# Patient Record
Sex: Female | Born: 1990 | Race: Black or African American | Hispanic: No | Marital: Married | Smoking: Never smoker
Health system: Southern US, Community
[De-identification: ages and names within clinical notes are randomized; demographics above are authoritative.]

## PROBLEM LIST (undated history)

## (undated) ENCOUNTER — Inpatient Hospital Stay (HOSPITAL_COMMUNITY): Payer: Self-pay

## (undated) DIAGNOSIS — O139 Gestational [pregnancy-induced] hypertension without significant proteinuria, unspecified trimester: Secondary | ICD-10-CM

## (undated) DIAGNOSIS — D649 Anemia, unspecified: Secondary | ICD-10-CM

## (undated) DIAGNOSIS — R112 Nausea with vomiting, unspecified: Secondary | ICD-10-CM

## (undated) DIAGNOSIS — O34219 Maternal care for unspecified type scar from previous cesarean delivery: Secondary | ICD-10-CM

## (undated) DIAGNOSIS — L0292 Furuncle, unspecified: Secondary | ICD-10-CM

## (undated) DIAGNOSIS — Z22322 Carrier or suspected carrier of Methicillin resistant Staphylococcus aureus: Secondary | ICD-10-CM

## (undated) DIAGNOSIS — I10 Essential (primary) hypertension: Secondary | ICD-10-CM

## (undated) DIAGNOSIS — Z9889 Other specified postprocedural states: Secondary | ICD-10-CM

## (undated) DIAGNOSIS — O909 Complication of the puerperium, unspecified: Secondary | ICD-10-CM

## (undated) HISTORY — PX: EYE SURGERY: SHX253

---
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
## 2008-02-08 ENCOUNTER — Emergency Department (HOSPITAL_COMMUNITY): Admission: EM | Admit: 2008-02-08 | Discharge: 2008-02-08 | Payer: Self-pay | Admitting: Emergency Medicine

## 2008-08-20 ENCOUNTER — Emergency Department (HOSPITAL_COMMUNITY): Admission: EM | Admit: 2008-08-20 | Discharge: 2008-08-20 | Payer: Self-pay | Admitting: Emergency Medicine

## 2008-08-22 ENCOUNTER — Emergency Department (HOSPITAL_COMMUNITY): Admission: EM | Admit: 2008-08-22 | Discharge: 2008-08-22 | Payer: Self-pay | Admitting: Emergency Medicine

## 2009-06-24 ENCOUNTER — Encounter: Payer: Self-pay | Admitting: Emergency Medicine

## 2009-06-24 ENCOUNTER — Inpatient Hospital Stay (HOSPITAL_COMMUNITY): Admission: AD | Admit: 2009-06-24 | Discharge: 2009-06-24 | Payer: Self-pay | Admitting: Family Medicine

## 2009-10-25 ENCOUNTER — Ambulatory Visit: Payer: Self-pay | Admitting: Obstetrics and Gynecology

## 2009-10-25 ENCOUNTER — Inpatient Hospital Stay (HOSPITAL_COMMUNITY): Admission: AD | Admit: 2009-10-25 | Discharge: 2009-10-27 | Payer: Self-pay | Admitting: Obstetrics and Gynecology

## 2009-11-08 ENCOUNTER — Inpatient Hospital Stay (HOSPITAL_COMMUNITY): Admission: AD | Admit: 2009-11-08 | Discharge: 2009-11-13 | Payer: Self-pay | Admitting: Family Medicine

## 2009-11-10 ENCOUNTER — Encounter: Payer: Self-pay | Admitting: Family Medicine

## 2010-08-27 ENCOUNTER — Emergency Department (HOSPITAL_COMMUNITY): Payer: Medicaid Other

## 2010-08-27 ENCOUNTER — Inpatient Hospital Stay (HOSPITAL_COMMUNITY)
Admission: AD | Admit: 2010-08-27 | Discharge: 2010-08-27 | Disposition: A | Discharge status: Home / Self Care | Payer: Medicaid Other | Source: Ambulatory Visit | Attending: Obstetrics and Gynecology | Admitting: Obstetrics and Gynecology

## 2010-08-27 ENCOUNTER — Emergency Department (HOSPITAL_COMMUNITY)
Admission: EM | Admit: 2010-08-27 | Discharge: 2010-08-27 | Disposition: A | Discharge status: Home / Self Care | Payer: Medicaid Other | Attending: Emergency Medicine | Admitting: Emergency Medicine

## 2010-08-27 DIAGNOSIS — E669 Obesity, unspecified: Secondary | ICD-10-CM | POA: Insufficient documentation

## 2010-08-27 DIAGNOSIS — I10 Essential (primary) hypertension: Secondary | ICD-10-CM | POA: Insufficient documentation

## 2010-08-27 DIAGNOSIS — R109 Unspecified abdominal pain: Secondary | ICD-10-CM | POA: Insufficient documentation

## 2010-08-27 DIAGNOSIS — O200 Threatened abortion: Secondary | ICD-10-CM | POA: Insufficient documentation

## 2010-08-27 DIAGNOSIS — O208 Other hemorrhage in early pregnancy: Secondary | ICD-10-CM | POA: Insufficient documentation

## 2010-08-27 DIAGNOSIS — A64 Unspecified sexually transmitted disease: Secondary | ICD-10-CM | POA: Insufficient documentation

## 2010-08-27 LAB — CBC
HCT: 31.6 % — ABNORMAL LOW (ref 36.0–46.0)
Hemoglobin: 10.1 g/dL — ABNORMAL LOW (ref 12.0–15.0)
MCH: 24.1 pg — ABNORMAL LOW (ref 26.0–34.0)
MCHC: 32 g/dL (ref 30.0–36.0)
MCV: 75.4 fL — ABNORMAL LOW (ref 78.0–100.0)
Platelets: 338 10*3/uL (ref 150–400)
RBC: 4.19 MIL/uL (ref 3.87–5.11)
RDW: 19 % — ABNORMAL HIGH (ref 11.5–15.5)
WBC: 7.4 10*3/uL (ref 4.0–10.5)

## 2010-08-27 LAB — URINALYSIS, ROUTINE W REFLEX MICROSCOPIC
Bilirubin Urine: NEGATIVE
Ketones, ur: NEGATIVE mg/dL
Nitrite: NEGATIVE
Protein, ur: NEGATIVE mg/dL
Specific Gravity, Urine: 1.007 (ref 1.005–1.030)
Urobilinogen, UA: 0.2 mg/dL (ref 0.0–1.0)
pH: 6.5 (ref 5.0–8.0)

## 2010-08-27 LAB — BASIC METABOLIC PANEL
BUN: 13 mg/dL (ref 6–23)
CO2: 26 mEq/L (ref 19–32)
Calcium: 9 mg/dL (ref 8.4–10.5)
Creatinine, Ser: 0.71 mg/dL (ref 0.4–1.2)
GFR calc Af Amer: >60 mL/min (ref >60)
GFR calc non Af Amer: >60 mL/min (ref >60)
Glucose, Bld: 98 mg/dL (ref 70–99)
Sodium: 136 mEq/L (ref 135–145)

## 2010-08-27 LAB — URINE MICROSCOPIC-ADD ON

## 2010-08-27 LAB — ABO/RH: ABO/RH(D): A POS

## 2010-08-27 LAB — ANTIBODY SCREEN: Antibody Screen: NEGATIVE

## 2010-08-27 LAB — HCG, QUANTITATIVE, PREGNANCY: hCG, Beta Chain, Quant, S: 1808 m[IU]/mL — ABNORMAL HIGH (ref <5)

## 2010-08-27 LAB — WET PREP, GENITAL: Yeast Wet Prep HPF POC: NONE SEEN

## 2010-08-27 LAB — PREGNANCY, URINE: Preg Test, Ur: POSITIVE

## 2010-08-29 LAB — GC/CHLAMYDIA PROBE AMP, GENITAL
Chlamydia, DNA Probe: NEGATIVE
GC Probe Amp, Genital: NEGATIVE

## 2010-09-05 LAB — COMPREHENSIVE METABOLIC PANEL
ALT: 33 U/L (ref 0–35)
ALT: 66 U/L — ABNORMAL HIGH (ref 0–35)
AST: 47 U/L — ABNORMAL HIGH (ref 0–37)
AST: 61 U/L — ABNORMAL HIGH (ref 0–37)
Albumin: 1.9 g/dL — ABNORMAL LOW (ref 3.5–5.2)
Albumin: 2.5 g/dL — ABNORMAL LOW (ref 3.5–5.2)
Alkaline Phosphatase: 87 U/L (ref 39–117)
BUN: 4 mg/dL — ABNORMAL LOW (ref 6–23)
BUN: 7 mg/dL (ref 6–23)
CO2: 21 mEq/L (ref 19–32)
Calcium: 7.6 mg/dL — ABNORMAL LOW (ref 8.4–10.5)
Calcium: 8.7 mg/dL (ref 8.4–10.5)
Chloride: 105 mEq/L (ref 96–112)
Chloride: 108 mEq/L (ref 96–112)
Creatinine, Ser: 0.54 mg/dL (ref 0.4–1.2)
Creatinine, Ser: 0.75 mg/dL (ref 0.4–1.2)
Creatinine, Ser: 0.85 mg/dL (ref 0.4–1.2)
GFR calc Af Amer: >60 mL/min (ref >60)
GFR calc Af Amer: >60 mL/min (ref >60)
GFR calc non Af Amer: >60 mL/min (ref >60)
Glucose, Bld: 103 mg/dL — ABNORMAL HIGH (ref 70–99)
Glucose, Bld: 72 mg/dL (ref 70–99)
Potassium: 4 mEq/L (ref 3.5–5.1)
Potassium: 4.5 mEq/L (ref 3.5–5.1)
Sodium: 136 mEq/L (ref 135–145)
Sodium: 136 mEq/L (ref 135–145)
Total Bilirubin: 0.1 mg/dL — ABNORMAL LOW (ref 0.3–1.2)
Total Bilirubin: 0.3 mg/dL (ref 0.3–1.2)
Total Protein: 6.4 g/dL (ref 6.0–8.3)
Total Protein: 6.5 g/dL (ref 6.0–8.3)

## 2010-09-05 LAB — PROTEIN, URINE, 24 HOUR
Collection Interval-UPROT: 24 hours
Protein, 24H Urine: 192 mg/d — ABNORMAL HIGH (ref 50–100)
Protein, Urine: 8 mg/dL
Urine Total Volume-UPROT: 2400 mL

## 2010-09-05 LAB — CBC
HCT: 29.9 % — ABNORMAL LOW (ref 36.0–46.0)
HCT: 30.7 % — ABNORMAL LOW (ref 36.0–46.0)
Hemoglobin: 10.1 g/dL — ABNORMAL LOW (ref 12.0–15.0)
Hemoglobin: 10.4 g/dL — ABNORMAL LOW (ref 12.0–15.0)
Hemoglobin: 8.7 g/dL — ABNORMAL LOW (ref 12.0–15.0)
MCHC: 33.9 g/dL (ref 30.0–36.0)
MCHC: 33.9 g/dL (ref 30.0–36.0)
MCHC: 34 g/dL (ref 30.0–36.0)
MCV: 82.3 fL (ref 78.0–100.0)
MCV: 82.5 fL (ref 78.0–100.0)
Platelets: 276 10*3/uL (ref 150–400)
Platelets: 306 10*3/uL (ref 150–400)
Platelets: 324 10*3/uL (ref 150–400)
RBC: 3.62 MIL/uL — ABNORMAL LOW (ref 3.87–5.11)
RBC: 3.73 MIL/uL — ABNORMAL LOW (ref 3.87–5.11)
RDW: 19.2 % — ABNORMAL HIGH (ref 11.5–15.5)
RDW: 19.3 % — ABNORMAL HIGH (ref 11.5–15.5)
RDW: 20.1 % — ABNORMAL HIGH (ref 11.5–15.5)
WBC: 10.7 10*3/uL — ABNORMAL HIGH (ref 4.0–10.5)
WBC: 14.9 10*3/uL — ABNORMAL HIGH (ref 4.0–10.5)
WBC: 5.9 10*3/uL (ref 4.0–10.5)

## 2010-09-05 LAB — CREATININE CLEARANCE, URINE, 24 HOUR
Collection Interval-CRCL: 24 hours
Creatinine Clearance: 178 mL/min — ABNORMAL HIGH (ref 75–115)
Creatinine, 24H Ur: 1387 mg/d (ref 700–1800)
Creatinine, Urine: 57.8 mg/dL
Creatinine: 0.54 mg/dL (ref 0.4–1.2)
Urine Total Volume-CRCL: 2400 mL

## 2010-09-05 LAB — LACTATE DEHYDROGENASE: LDH: 123 U/L (ref 94–250)

## 2010-09-05 LAB — RPR: RPR Ser Ql: NONREACTIVE

## 2010-09-05 LAB — MRSA PCR SCREENING: MRSA by PCR: POSITIVE — AB

## 2010-09-06 LAB — COMPREHENSIVE METABOLIC PANEL
ALT: 74 U/L — ABNORMAL HIGH (ref 0–35)
ALT: 75 U/L — ABNORMAL HIGH (ref 0–35)
AST: 73 U/L — ABNORMAL HIGH (ref 0–37)
AST: 82 U/L — ABNORMAL HIGH (ref 0–37)
Albumin: 2.3 g/dL — ABNORMAL LOW (ref 3.5–5.2)
Alkaline Phosphatase: 69 U/L (ref 39–117)
Alkaline Phosphatase: 72 U/L (ref 39–117)
BUN: 7 mg/dL (ref 6–23)
BUN: 7 mg/dL (ref 6–23)
CO2: 21 mEq/L (ref 19–32)
CO2: 23 mEq/L (ref 19–32)
CO2: 25 mEq/L (ref 19–32)
Calcium: 8.9 mg/dL (ref 8.4–10.5)
Chloride: 105 mEq/L (ref 96–112)
Chloride: 108 mEq/L (ref 96–112)
Creatinine, Ser: 0.58 mg/dL (ref 0.4–1.2)
Creatinine, Ser: 0.61 mg/dL (ref 0.4–1.2)
GFR calc Af Amer: >60 mL/min (ref >60)
GFR calc Af Amer: >60 mL/min (ref >60)
GFR calc non Af Amer: >60 mL/min (ref >60)
GFR calc non Af Amer: >60 mL/min (ref >60)
Glucose, Bld: 87 mg/dL (ref 70–99)
Potassium: 3.8 mEq/L (ref 3.5–5.1)
Potassium: 4.1 mEq/L (ref 3.5–5.1)
Sodium: 138 mEq/L (ref 135–145)
Total Bilirubin: 0.1 mg/dL — ABNORMAL LOW (ref 0.3–1.2)
Total Bilirubin: 0.4 mg/dL (ref 0.3–1.2)
Total Bilirubin: 0.6 mg/dL (ref 0.3–1.2)

## 2010-09-06 LAB — CREATININE CLEARANCE, URINE, 24 HOUR
Collection Interval-CRCL: 24 hours
Creatinine, Urine: 83.1 mg/dL
Creatinine: 0.58 mg/dL (ref 0.4–1.2)

## 2010-09-06 LAB — AMYLASE: Amylase: 63 U/L (ref 0–105)

## 2010-09-06 LAB — CBC
HCT: 29.2 % — ABNORMAL LOW (ref 36.0–46.0)
HCT: 29.8 % — ABNORMAL LOW (ref 36.0–46.0)
Hemoglobin: 9.9 g/dL — ABNORMAL LOW (ref 12.0–15.0)
MCHC: 34.1 g/dL (ref 30.0–36.0)
MCV: 81.6 fL (ref 78.0–100.0)
Platelets: 306 10*3/uL (ref 150–400)
RBC: 3.62 MIL/uL — ABNORMAL LOW (ref 3.87–5.11)
WBC: 11.7 10*3/uL — ABNORMAL HIGH (ref 4.0–10.5)

## 2010-09-06 LAB — LIPASE, BLOOD: Lipase: 26 U/L (ref 11–59)

## 2010-09-06 LAB — LACTATE DEHYDROGENASE: LDH: 215 U/L (ref 94–250)

## 2010-09-06 LAB — PROTEIN, URINE, 24 HOUR
Protein, 24H Urine: 171 mg/d — ABNORMAL HIGH (ref 50–100)
Urine Total Volume-UPROT: 1900 mL

## 2010-09-29 LAB — BASIC METABOLIC PANEL
BUN: 7 mg/dL (ref 6–23)
GFR calc non Af Amer: >60 mL/min (ref >60)
Potassium: 3.4 mEq/L — ABNORMAL LOW (ref 3.5–5.1)
Sodium: 134 mEq/L — ABNORMAL LOW (ref 135–145)

## 2010-09-29 LAB — URINALYSIS, ROUTINE W REFLEX MICROSCOPIC
Nitrite: NEGATIVE
Urobilinogen, UA: 1 mg/dL (ref 0.0–1.0)
pH: 6 (ref 5.0–8.0)

## 2010-09-29 LAB — URINE MICROSCOPIC-ADD ON

## 2010-09-29 LAB — DIFFERENTIAL
Eosinophils Relative: 3 % (ref 0–5)
Lymphocytes Relative: 5 % — ABNORMAL LOW (ref 12–46)
Lymphs Abs: 0.6 10*3/uL — ABNORMAL LOW (ref 0.7–4.0)
Neutro Abs: 11.1 10*3/uL — ABNORMAL HIGH (ref 1.7–7.7)

## 2010-09-29 LAB — URINE CULTURE

## 2010-09-29 LAB — CBC
HCT: 33.2 % — ABNORMAL LOW (ref 36.0–46.0)
Platelets: 353 10*3/uL (ref 150–400)
WBC: 12.8 10*3/uL — ABNORMAL HIGH (ref 4.0–10.5)

## 2010-09-29 LAB — RAPID STREP SCREEN (MED CTR MEBANE ONLY): Streptococcus, Group A Screen (Direct): POSITIVE — AB

## 2010-11-04 NOTE — Op Note (Signed)
NAME:  Krystal Hartman, Krystal Hartman                      ACCOUNT NO.:  [id number]   MEDICAL RECORD NO.:  [id number]                   PATIENT TYPE:  AMB   LOCATION:  DSC                                  FACILITY:  [facility]   PHYSICIAN:  Pasty Spillers. Maple Hudson, M.D.              DATE OF BIRTH:  02-Jun-1991   DATE OF PROCEDURE:  DATE OF DISCHARGE:                                 OPERATIVE REPORT   DATE OF SURGERY:  January 15, 2004.   PREOPERATIVE DIAGNOSIS:  Exotropia.   POSTOPERATIVE DIAGNOSIS:  Exotropia.   PROCEDURE:  Lateral rectus muscle recession, 10.0 mm OU.   SURGEON:  Pasty Spillers. Young, MD.   ANESTHESIA:  General (laryngeal mask).   COMPLICATIONS:  None.   DESCRIPTION OF PROCEDURE:  After preoperative evaluation including informed  consent from the parents, the patient was taken to the operating room and  identified by me.  General anesthesia was induced without difficulty after  placement of appropriate monitors.  The patient was prepped and draped in a  standard sterile fashion.  A lid speculum was placed in the right eye.   With the inferotemporal fornix incision through conjunctiva and tenon's  fascia, the right lateral rectus muscle was engaged on a series of muscle  hooks and carefully cleared of its fascial attachments.  The tendon was  secured with a double arm 6-0 Vicryl suture, the double locking bite at each  border of the muscle, 1 mm from the insertion.  The muscle was disinserted  from the globe using Westcott scissors and was reattached to sclera at a  measured distance of 10.0 mm posterior to the original insertion, using  direct scleral passes in a crossed-swords fashion.  The suture ends were  tied securely after the position in the muscle had been checked and found to  be accurate.  The conjunctiva was closed with two interrupted 6-0 Vicryl  sutures.  The lid speculum was transferred to the left eye, where an  identical procedure was performed, again effecting a 10.0 mm  recession of  the lateral rectus muscle.  TobraDex ointment was placed in each eye.  The  patient was awakened without difficulty and taken to the recovery room in  stable condition, having suffered no intraoperative or immediate  postoperative complications.                                               Pasty Spillers. Maple Hudson, M.D.    Cheron Schaumann  D:  01/15/2004  T:  01/15/2004  Job:  [id number]

## 2011-01-13 LAB — HIV ANTIBODY (ROUTINE TESTING W REFLEX): HIV: NONREACTIVE

## 2011-01-13 LAB — HEPATITIS B SURFACE ANTIGEN: Hepatitis B Surface Ag: NEGATIVE

## 2011-01-13 LAB — RPR: RPR: NONREACTIVE

## 2011-03-18 ENCOUNTER — Other Ambulatory Visit: Payer: Self-pay | Admitting: Obstetrics and Gynecology

## 2011-03-18 ENCOUNTER — Encounter (HOSPITAL_COMMUNITY): Payer: Self-pay

## 2011-03-18 ENCOUNTER — Inpatient Hospital Stay (HOSPITAL_COMMUNITY): Payer: Medicaid Other

## 2011-03-18 ENCOUNTER — Inpatient Hospital Stay (HOSPITAL_COMMUNITY)
Admission: AD | Admit: 2011-03-18 | Discharge: 2011-03-18 | Disposition: A | Discharge status: Home / Self Care | Payer: Medicaid Other | Source: Ambulatory Visit | Attending: Obstetrics and Gynecology | Admitting: Obstetrics and Gynecology

## 2011-03-18 DIAGNOSIS — O368190 Decreased fetal movements, unspecified trimester, not applicable or unspecified: Secondary | ICD-10-CM | POA: Insufficient documentation

## 2011-03-18 HISTORY — DX: Carrier or suspected carrier of methicillin resistant Staphylococcus aureus: Z22.322

## 2011-03-18 HISTORY — DX: Essential (primary) hypertension: I10

## 2011-03-18 NOTE — Progress Notes (Signed)
No fetal movement since this afternoon felts some earlier today

## 2011-03-18 NOTE — ED Provider Notes (Signed)
History   20 yo G3P1011 at 82 3/7 weeks presented c/o decreased FM today.  Has had perception of FM daily, until today.  Reports some upper abdominal tightness, "like constipation".  Denies leaking, bleeding, or dysuria.  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 Decreased Fetal Movement     OB History    Grav Para Term Preterm Abortions TAB SAB Ect Mult Living   3 1 1  1 1    1        History  Substance Use Topics  . Smoking status: Not on file  . Smokeless tobacco: Not on file  . Alcohol Use: Not on file    Allergies:None    Physical Exam   Blood pressure 138/82, pulse 91, temperature 98.2 F (36.8 C), temperature source Oral, resp. rate 16, height 5\' 4"  (1.626 m), weight 170.552 kg (376 lb).  Chest clear Heart RRR Abd--Gravid, NT FHR 140s per cardio, no decels. No UCs. Audible FM noted. Cervix long and closed, firm  ED Course  IUP at 23 3/7 weeks Perception of decreased FM  Will check limited US due to significant limitations from body habitus on fetal assessment. If findings WNL, will d/c home. Has scheduled appointment on Tuesday at [facility] for follow-up anatomy ultrasound.  Nigel Bridgeman, CNM 03/18/11 1830

## 2011-05-25 ENCOUNTER — Inpatient Hospital Stay (HOSPITAL_COMMUNITY)
Admission: AD | Admit: 2011-05-25 | Discharge: 2011-06-01 | DRG: 781 | Disposition: A | Discharge status: Home / Self Care | Payer: Medicaid Other | Source: Ambulatory Visit | Attending: Obstetrics and Gynecology | Admitting: Obstetrics and Gynecology

## 2011-05-25 ENCOUNTER — Encounter (HOSPITAL_COMMUNITY): Payer: Self-pay | Admitting: *Deleted

## 2011-05-25 ENCOUNTER — Other Ambulatory Visit: Payer: Self-pay | Admitting: Obstetrics and Gynecology

## 2011-05-25 DIAGNOSIS — L02229 Furuncle of trunk, unspecified: Secondary | ICD-10-CM | POA: Diagnosis present

## 2011-05-25 DIAGNOSIS — Z8619 Personal history of other infectious and parasitic diseases: Secondary | ICD-10-CM | POA: Insufficient documentation

## 2011-05-25 DIAGNOSIS — A4902 Methicillin resistant Staphylococcus aureus infection, unspecified site: Secondary | ICD-10-CM | POA: Diagnosis present

## 2011-05-25 DIAGNOSIS — O99210 Obesity complicating pregnancy, unspecified trimester: Secondary | ICD-10-CM | POA: Diagnosis present

## 2011-05-25 DIAGNOSIS — B009 Herpesviral infection, unspecified: Secondary | ICD-10-CM | POA: Diagnosis not present

## 2011-05-25 DIAGNOSIS — O99019 Anemia complicating pregnancy, unspecified trimester: Secondary | ICD-10-CM | POA: Diagnosis present

## 2011-05-25 DIAGNOSIS — O99891 Other specified diseases and conditions complicating pregnancy: Secondary | ICD-10-CM | POA: Diagnosis present

## 2011-05-25 DIAGNOSIS — D649 Anemia, unspecified: Secondary | ICD-10-CM

## 2011-05-25 DIAGNOSIS — E669 Obesity, unspecified: Secondary | ICD-10-CM | POA: Diagnosis present

## 2011-05-25 DIAGNOSIS — L0293 Carbuncle, unspecified: Secondary | ICD-10-CM | POA: Diagnosis present

## 2011-05-25 DIAGNOSIS — O403XX0 Polyhydramnios, third trimester, not applicable or unspecified: Secondary | ICD-10-CM | POA: Diagnosis present

## 2011-05-25 DIAGNOSIS — L732 Hidradenitis suppurativa: Secondary | ICD-10-CM | POA: Diagnosis not present

## 2011-05-25 DIAGNOSIS — L02239 Carbuncle of trunk, unspecified: Secondary | ICD-10-CM | POA: Diagnosis present

## 2011-05-25 DIAGNOSIS — O34219 Maternal care for unspecified type scar from previous cesarean delivery: Secondary | ICD-10-CM | POA: Diagnosis present

## 2011-05-25 DIAGNOSIS — O283 Abnormal ultrasonic finding on antenatal screening of mother: Secondary | ICD-10-CM | POA: Diagnosis present

## 2011-05-25 DIAGNOSIS — O10019 Pre-existing essential hypertension complicating pregnancy, unspecified trimester: Principal | ICD-10-CM | POA: Diagnosis present

## 2011-05-25 DIAGNOSIS — O409XX0 Polyhydramnios, unspecified trimester, not applicable or unspecified: Secondary | ICD-10-CM | POA: Diagnosis present

## 2011-05-25 LAB — CBC
HCT: 28.8 % — ABNORMAL LOW (ref 36.0–46.0)
Hemoglobin: 9.1 g/dL — ABNORMAL LOW (ref 12.0–15.0)
MCV: 76.8 fL — ABNORMAL LOW (ref 78.0–100.0)
Platelets: 263 10*3/uL (ref 150–400)
RBC: 3.75 MIL/uL — ABNORMAL LOW (ref 3.87–5.11)
WBC: 8.5 10*3/uL (ref 4.0–10.5)

## 2011-05-25 LAB — URINALYSIS, ROUTINE W REFLEX MICROSCOPIC
Bilirubin Urine: NEGATIVE
Hgb urine dipstick: NEGATIVE
Leukocytes, UA: NEGATIVE
Specific Gravity, Urine: 1.025 (ref 1.005–1.030)
Urobilinogen, UA: 0.2 mg/dL (ref 0.0–1.0)
pH: 6 (ref 5.0–8.0)

## 2011-05-25 LAB — GLUCOSE, CAPILLARY: Glucose-Capillary: 172 mg/dL — ABNORMAL HIGH (ref 70–99)

## 2011-05-25 MED ORDER — VALACYCLOVIR HCL 500 MG PO TABS
500.0000 mg | ORAL_TABLET | Freq: Two times a day (BID) | ORAL | Status: AC
  Administered 2011-05-25 – 2011-05-30 (×10): 500 mg via ORAL
  Filled 2011-05-25 (×10): qty 1

## 2011-05-25 MED ORDER — CALCIUM CARBONATE ANTACID 500 MG PO CHEW: 2.0000 | CHEWABLE_TABLET | ORAL | Status: DC | PRN

## 2011-05-25 MED ORDER — LACTATED RINGERS IV SOLN
INTRAVENOUS | Status: DC
  Administered 2011-05-27 – 2011-05-29 (×6): via INTRAVENOUS

## 2011-05-25 MED ORDER — ACETAMINOPHEN 325 MG PO TABS: 650.0000 mg | ORAL_TABLET | ORAL | Status: DC | PRN

## 2011-05-25 MED ORDER — ZOLPIDEM TARTRATE 10 MG PO TABS: 10.0000 mg | ORAL_TABLET | Freq: Every evening | ORAL | Status: DC | PRN

## 2011-05-25 MED ORDER — PRENATAL PLUS 27-1 MG PO TABS
1.0000 | ORAL_TABLET | Freq: Every day | ORAL | Status: DC
  Administered 2011-05-25 – 2011-06-01 (×8): 1 via ORAL
  Filled 2011-05-25 (×8): qty 1

## 2011-05-25 MED ORDER — CLOTRIMAZOLE 2 % VA CREA
1.0000 | TOPICAL_CREAM | Freq: Every day | VAGINAL | Status: AC
  Administered 2011-05-25 – 2011-05-26 (×2): 1 via VAGINAL
  Filled 2011-05-25: qty 22.2

## 2011-05-25 MED ORDER — DOCUSATE SODIUM 100 MG PO CAPS
100.0000 mg | ORAL_CAPSULE | Freq: Every day | ORAL | Status: DC
  Administered 2011-05-25 – 2011-06-01 (×7): 100 mg via ORAL
  Filled 2011-05-25 (×8): qty 1

## 2011-05-25 MED ORDER — CLINDAMYCIN HCL 300 MG PO CAPS
450.0000 mg | ORAL_CAPSULE | Freq: Four times a day (QID) | ORAL | Status: DC
  Administered 2011-05-25 – 2011-05-26 (×3): 450 mg via ORAL
  Filled 2011-05-25 (×7): qty 1

## 2011-05-25 NOTE — H&P (Deleted)
Krystal Hartman is a 20 y.o. female presenting for fetal monitoring, secondary to BPP in office today 2/8, showing polydramnios. See note per V.Emilee Hero CNM for complete H&P History OB History    Grav Para Term Preterm Abortions TAB SAB Ect Mult Living   3 1 1  0 1 0 1 0 0 1     Past Medical History  Diagnosis Date  . Hypertension   . MRSA (methicillin resistant Staphylococcus aureus) colonization    Past Surgical History  Procedure Date  . Eye surgery   . Cesarean section    Family History: family history includes Diabetes in her maternal aunt and mother. Social History:  reports that she has never smoked. She has never used smokeless tobacco. She reports that she does not drink alcohol or use illicit drugs.  ROS    Blood pressure 144/82, pulse 102, temperature 98.8 F (37.1 C), temperature source Oral, resp. rate 20, height 5\' 4"  (1.626 m), weight 175.542 kg (387 lb). Maternal Exam:  Introitus: Vagina is negative for discharge.    Physical Exam  Constitutional: She is oriented to person, place, and time. She appears well-developed and well-nourished.       She denies any HA/N/V or RUQ pain  Neck: Normal range of motion.  Cardiovascular: Normal rate, regular rhythm and normal heart sounds.   Respiratory: Effort normal and breath sounds normal.  GI: Soft. Bowel sounds are normal. She exhibits no distension. There is no tenderness.       Pt denies any ctx, has occ cramping that she has been having,   Genitourinary: Vagina normal. No vaginal discharge found.       Pt currently being treated for yeast infection, has completed 5 doses of terazol  Musculoskeletal: Normal range of motion. She exhibits no edema.  Neurological: She is alert and oriented to person, place, and time. She has normal reflexes.  Skin: Skin is warm and dry.       L upper side/back lesion about 3cm, has some clear drainage 2nd lesion in L groin area about .5cm, also some clear drainage    Pelvic: cervix  =firm/long/closed - will FFN for now   Prenatal labs: ABO, Rh: --/--/A POS (03/10 0805) Antibody:   Rubella:   RPR:    HBsAg:    HIV:    GBS:     Assessment/Plan: Will do FFN and VE Send MRSA cx for skin lesions  D/W  Dr Pennie Rushing, will send FFN   Kyshon Tolliver M 05/25/2011, 8:45 PM

## 2011-05-25 NOTE — Progress Notes (Signed)
Krystal Hartman is a 20 y.o. G3P1011 at [redacted]w[redacted]d by LMP admitted for BPP of 2/8 in office today.  Subjective: GI: negative GU: Denies: dysuria, frequency/urgency, hematuria OB: good movement        Objective: BP 144/82  Pulse 102  Temp(Src) 98.8 F (37.1 C) (Oral)  Resp 22  Ht 5\' 4"  (1.626 m)  Wt 387 lb (175.542 kg)  BMI 66.43 kg/m2      FHT:  FHR: 130s bpm, variability: minimal ,  accelerations:  Abscent,  decelerations:  Absent UC:   irregular, every 6-8 minutes SVE:   Dilation: Closed Exam by:: Sanda Klein, CNM  Labs: Lab Results  Component Value Date   WBC 7.4 08/27/2010   HGB 10.1* 08/27/2010   HCT 31.6* 08/27/2010   MCV 75.4* 08/27/2010   PLT 338 08/27/2010   FFN NEG Assessment / Plan: NON reassuring FHRT  Fetal Wellbeing:  Category II  Observe FHR overnight.  Repeat BPP in am.  MFM consult and ID consult in am. [place] P 05/25/2011, 10:03 PM

## 2011-05-25 NOTE — H&P (Signed)
Krystal Hartman is a 20 y.o. female, 54 1/7 weeks, G3P1011,presenting for admission due to [facility] 2/8 today in office.  Credit given for fluid volume only.  Patient also has a possible MRSA lesion on her back--swollen and red, with hx MRSA in past.  Fluid volume was 35 cm today, previously 28 on last [place].  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  Had been on HCTZ prior to pregnancy, stopped before starting care.  PIH labs, 24 hour urine, and 1 h GTT were done at 17 weeks, with elevated glucola only abnormal finding.  Had normal 3 hr GTT following.  Had boil under right breast at 17 weeks, and another on abdomen at 20 weeks, cultured as staph, negative MRSA (hx past MRSA).  Had [place] at 19 weeks, with limited anatomy, normal follow-up for anatomy completion.  Desires VBAC--previous C/S was 2 layer closure per op note.  Had follow-up US at 27 weeks, with normal growth and fluid.  Had repeat 3 hr GTT 05/04/11--normal.  Had [place] at 32 weeks with polyhydramnios noted--AFI 28.  [place] in office today had AFI of 35, BPP 2/8, only credit for fluid.     OB History    Grav Para Term Preterm Abortions TAB SAB Ect Mult Living   3 1 1  1  0 1   1    #1--5/11. Primary LTCS of female infant, 7+3 at 39 weeks after 48 hours labor.  HTN in that pregnancy, treated short term with medications.   #2--3/12. SAB at 6 weeks.  Past Medical History  Diagnosis Date  . Hypertension   . MRSA (methicillin resistant Staphylococcus aureus) colonization   Hx ovarian cysts.  Hx trich 3/12.  HTN diagnosed 2010, on HCTZ.  Hx cystitis.    Past Surgical History  Procedure Date  . Eye surgery   . Cesarean section   I&D of folliculitis lesions  Family History: family history is not on file. Sister, mother, 1st cousin HTN.  Mother,  sister  trait.  Mother, sister anemia.  Mother, MA, PAs x2  insulin dependent diabetics.  MA hypothyroid.  MGM stomach ca.  Sister paranoid schizophrenic, bipolar.  Mother, sister smokers.  Social History:  reports that she has never smoked. She has never used smokeless tobacco. She reports that she does not drink alcohol or use illicit drugs.Married to FOB Evelena Leyden).  Patient has 10th grade education, unemployed.  FOB with HS education, employed in warehouse.  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 rate 18, height 5\' 4"  (1.626 m), weight 175.542 kg (387 lb). See addendum to H&P for PE by Gevena Barre, CNM.  Prenatal labs: ABO, Rh: --/--/A POS (03/10 0805) Antibody:  Neg Rubella:  Immune RPR:   NR HBsAg:   Neg HIV:   Neg GBS:   Not yet done Hgb 9.5 at NOB 1 hour glucola high at 19 weeks, 3 hour GTT WNL. Repeat 3 h GTT 05/04/11 WNL.  Initial FHR tracing reassuring, with mild irritability.  Assessment/Plan: IUP at 33 1/7 weeks BPP 2/8 Polyhydramnios Morbid obesity Hx MRSA, with current possible MRSA lesion on back Previous C/S 5/11 Hx HSV 2 Chronic HTN  Plan: Admit to Antenatal Unit per  consult with Dr. Pennie Rushing. Continuous EFM Repeat BPP in am FBS and 2 h pp tomorrow   Nigel Bridgeman 05/25/2011, 6:55 PM

## 2011-05-26 ENCOUNTER — Inpatient Hospital Stay (HOSPITAL_COMMUNITY): Payer: Medicaid Other

## 2011-05-26 DIAGNOSIS — L0293 Carbuncle, unspecified: Secondary | ICD-10-CM

## 2011-05-26 DIAGNOSIS — L0292 Furuncle, unspecified: Secondary | ICD-10-CM

## 2011-05-26 DIAGNOSIS — D649 Anemia, unspecified: Secondary | ICD-10-CM

## 2011-05-26 LAB — GC/CHLAMYDIA PROBE AMP, GENITAL: Chlamydia, DNA Probe: NEGATIVE

## 2011-05-26 LAB — MRSA PCR SCREENING: MRSA by PCR: POSITIVE — AB

## 2011-05-26 MED ORDER — VANCOMYCIN HCL 1000 MG IV SOLR
2000.0000 mg | Freq: Once | INTRAVENOUS | Status: AC
  Administered 2011-05-26: 2000 mg via INTRAVENOUS
  Filled 2011-05-26: qty 2000

## 2011-05-26 MED ORDER — VANCOMYCIN HCL 1000 MG IV SOLR
1500.0000 mg | Freq: Two times a day (BID) | INTRAVENOUS | Status: DC
  Administered 2011-05-27 – 2011-05-28 (×4): 1500 mg via INTRAVENOUS
  Filled 2011-05-26 (×5): qty 1500

## 2011-05-26 NOTE — Progress Notes (Signed)
UR Chart review completed.  

## 2011-05-26 NOTE — Consult Note (Signed)
MATERNAL FETAL MEDICINE CONSULT  Patient Name: Krystal Hartman Medical Record Number:  [id number] Date of Birth: [birth year] Requesting Physician Name:  Hal Morales, MD Date of Service: 05/26/2011  Chief Complaint Non-reassuring BPP in clinic  History of Present Illness Krystal Hartman was seen today for prenatal diagnosis secondary to non-reassuring BPP of 2/8 in clinic yesterday at the request of Hal Morales, MD.  The patient is a 20 y.o. G3P1011, at [redacted]w[redacted]d with an EDD of 07/12/2011 by Alternate EDD Entry dating method.  She was seen for a follow up BPP yesterday in clinic due to a history of polyhydramnios discovered on a prior ultrasound.  Polyhydramnios was again noted and the patient was found to have a BPP of 2/8.  After transfer to [facility] she was found to have a reassuring NST and was observed overnight.  She reports normal fetal movement this morning.  BPP today in the [facility] was 8/8, with mild polyhydramnios.  She reports no vaignal bleeding, loss of fluid, contractions, headache, visual changes, increased swelling, or RUQ pain.  She reports being tested twice for gestational diabetes this pregnancy and both tests were normal.  Review of Systems Pertinent items are noted in HPI.  Patient History OB History    Grav Para Term Preterm Abortions TAB SAB Ect Mult Living   3 1 1  0 1 0 1 0 0 1     # Outc Date GA Lbr Len/2nd Wgt Sex Del Anes PTL Lv   1 TRM            2 SAB            3 CUR               Past Medical History  Diagnosis Date  . Hypertension   . MRSA (methicillin resistant Staphylococcus aureus) colonization     Past Surgical History  Procedure Date  . Eye surgery   . Cesarean section     History   Social History  . Marital Status: Married    Spouse Name: N/A    Number of Children: N/A  . Years of Education: N/A   Social History Main Topics  . Smoking status: Never Smoker   . Smokeless tobacco: Never Used  . Alcohol Use: No  .  Drug Use: No  . Sexually Active: Yes   Other Topics Concern  . None   Social History Narrative  . None    Family History  Problem Relation Age of Onset  . Diabetes Mother   . Diabetes Maternal Aunt    In addition, the patient has no family history of mental retardation, birth defects, or genetic diseases.  Physical Examination Patient Vitals for the past 24 hrs:  BP Temp Temp src Pulse Resp Height Weight  05/26/11 0743 131/84 mmHg 98.2 F (36.8 C) Oral 91  20  - -  05/26/11 0637 130/70 mmHg 98.7 F (37.1 C) Oral 90  22  - -  05/26/11 0000 - - - - 20  - -  05/25/11 2300 - - - - 20  - -  05/25/11 2200 - - - - 20  - -  05/25/11 2100 - - - - 22  - -  05/25/11 2003 144/82 mmHg 98.8 F (37.1 C) Oral 102  20  - -  05/25/11 1908 146/77 mmHg - - 110  - - -  05/25/11 1906 146/77 mmHg 98.9 F (37.2 C) Oral 110  18  5\' 4"  (  1.626 m) 387 lb (175.542 kg)   General appearance - alert, well appearing, and in no distress Chest - clear to auscultation, no wheezes, rales or rhonchi, symmetric air entry Heart - normal rate, regular rhythm, normal S1, S2, no murmurs, rubs, clicks or gallops Abdomen - soft, nontender, nondistended, no masses or organomegaly   Labs Results for orders placed during the hospital encounter of 05/25/11 (from the past 24 hour(s))  FETAL FIBRONECTIN     Status: Normal   Collection Time   05/25/11  9:05 PM      Component Value Range   Fetal Fibronectin NEGATIVE  NEGATIVE   MRSA PCR SCREENING     Status: Normal   Collection Time   05/25/11  9:05 PM      Component Value Range   MRSA by PCR NEGATIVE  NEGATIVE   URINALYSIS, ROUTINE W REFLEX MICROSCOPIC     Status: Abnormal   Collection Time   05/25/11 10:00 PM      Component Value Range   Color, Urine ORANGE (*) YELLOW    APPearance CLEAR  CLEAR    Specific Gravity, Urine 1.025  1.005 - 1.030    pH 6.0  5.0 - 8.0    Glucose, UA NEGATIVE  NEGATIVE (mg/dL)   Hgb urine dipstick NEGATIVE  NEGATIVE    Bilirubin  Urine NEGATIVE  NEGATIVE    Ketones, ur 15 (*) NEGATIVE (mg/dL)   Protein, ur NEGATIVE  NEGATIVE (mg/dL)   Urobilinogen, UA 0.2  0.0 - 1.0 (mg/dL)   Nitrite NEGATIVE  NEGATIVE    Leukocytes, UA NEGATIVE  NEGATIVE   CBC     Status: Abnormal   Collection Time   05/25/11 10:35 PM      Component Value Range   WBC 8.5  4.0 - 10.5 (K/uL)   RBC 3.75 (*) 3.87 - 5.11 (MIL/uL)   Hemoglobin 9.1 (*) 12.0 - 15.0 (g/dL)   HCT 81.1 (*) 91.4 - 46.0 (%)   MCV 76.8 (*) 78.0 - 100.0 (fL)   MCH 24.3 (*) 26.0 - 34.0 (pg)   MCHC 31.6  30.0 - 36.0 (g/dL)   RDW 78.2 (*) 95.6 - 15.5 (%)   Platelets 263  150 - 400 (K/uL)  RPR     Status: Normal   Collection Time   05/25/11 10:35 PM      Component Value Range   RPR NON REACTIVE  NON REACTIVE   GLUCOSE, CAPILLARY     Status: Abnormal   Collection Time   05/25/11 11:05 PM      Component Value Range   Glucose-Capillary 172 (*) 70 - 99 (mg/dL)  GLUCOSE, CAPILLARY     Status: Normal   Collection Time   05/26/11  6:32 AM      Component Value Range   Glucose-Capillary 79  70 - 99 (mg/dL)    Assessment and Recommendations 1.  Non-reassuring BPP.  The reason for the non-reassuring BPP yesterday in clinic is unclear.  Fortunately, the fetal status is reassuring this morning as the patient reports normal fetal movement, has a reactive NST, and normal BPP of 8/8.  Given the severely abnormal BPP yesterday I would continue in hospital observation for one or two more days, repeating a BPP each day keeping the patient on continuous monitoring while in house.  If fetal testing remains reassuring during this time dissmissal would be appropriate.  As the patient has chronic hypertension I recommend proceeding with twice weekly fetal testing after dismissal.   2.  Elevated BP.  It is incidentally noted that the patient's BP has been elevated above 140/90 on several occasions since admission.  I recommend evaluating the patient for superimposed preeclampsia by obtaining a 24  hour urine collection and drawing LFT'S and a creatinine.  If the patient meets criteria for superimposed preeclampsia I would recommend delivery as the patient is nearly [redacted] weeks gestation.   Rema Fendt

## 2011-05-26 NOTE — Progress Notes (Signed)
Pt states she still cannot feel ctx's at this time.

## 2011-05-26 NOTE — Progress Notes (Signed)
ANTIBIOTIC CONSULT NOTE - INITIAL  Pharmacy Consult for Vancomycin Indication:Recurrent Boils  Allergies  Allergen Reactions  . Latex Hives and Itching  . Pork-Derived Products Other (See Comments)    Religous reasons does not eat    Patient Measurements: Height: 5\' 4"  (162.6 cm) Weight: 387 lb (175.542 kg) IBW/kg (Calculated) : 54.7    Vital Signs: Temp: 98.7 F (37.1 C) (12/07 1233) Temp src: Oral (12/07 1233) BP: 148/73 mmHg (12/07 1233) Pulse Rate: 94  (12/07 1233)   Labs:  [facility] 05/25/11 2235  WBC 8.5  HGB 9.1*  PLT 263  LABCREA --  CREATININE --   Last SCreatinine from 3/12= 0.71 with estimated CrCl > 133ml/min. Will draw SCr in am to assess current renal function.   Microbiology: Recent Results (from the past 720 hour(s))  MRSA PCR SCREENING     Status: Normal   Collection Time   05/25/11  9:05 PM      Component Value Range Status Comment   MRSA by PCR NEGATIVE  NEGATIVE  Final   MRSA PCR SCREENING     Status: Abnormal   Collection Time   05/26/11  9:50 AM      Component Value Range Status Comment   MRSA by PCR POSITIVE (*) NEGATIVE  Final     Medical History: Past Medical History  Diagnosis Date  . Hypertension   . MRSA (methicillin resistant Staphylococcus aureus) colonization     Medications:  Clindamycin 450mg  po q6h  12/6-12/7 Assessment: 20yo [redacted] week GA patient admitted with recurrent boils. Pt has h/o mixed bacteria isolated from boils in past.  Goal of Therapy:  Vancomycin trough 15-20 mcg/ml due to h/o MRSA colonization in past.  Plan:  1. Vancomycin 2000mg  IV x 1. Then, Vancomycin 1500mg  IV q12h. 2. Draw SCreatinine in am to assess current renal function. 3. Will draw Vanc trough at steady state and adjust dose accordingly. Will continue to follow. Thanks!  Claybon Jabs 05/26/2011,4:29 PM

## 2011-05-26 NOTE — Progress Notes (Signed)
Subjective: No complaints this AM.  Just returned from u/s and 8/8 BPP.  MRSA screen entered incorrectly, so RN to do nasal swab this AM.  Fasting CBG=79 per RN.  Good FM.  No ctxs, LOF, or VB to report.  Objective: BP 131/84  Pulse 91  Temp(Src) 98.2 F (36.8 C) (Oral)  Resp 20  Ht 5\' 4"  (1.626 m)  Wt 175.542 kg (387 lb)  BMI 66.43 kg/m2      FHT:  FHR: 130 bpm, variability: moderate,  accelerations:  Present,  decelerations:  Absent UC:   occ'l ctxs; intermittent UI SVE:   Dilation: Closed Exam by:: Sanda Klein, CNM  Labs: Lab Results  Component Value Date   WBC 8.5 05/25/2011   HGB 9.1* 05/25/2011   HCT 28.8* 05/25/2011   MCV 76.8* 05/25/2011   PLT 263 05/25/2011    Assessment / Plan: 1.  IUP at 33.2 2.  8/8 BPP this AM w/ overall 10/10 total 3.  Morbidly obese 4.  Polyhydramnios 5.  H/o MRSA 6.  HSV hx  Fetal Wellbeing:  Category I  1.  Continue current POC 2.  MD to follow  Jamal Haskin H 05/26/2011, 9:59 AM

## 2011-05-26 NOTE — Progress Notes (Signed)
Pt states she does not feel ctx's.

## 2011-05-26 NOTE — Consult Note (Signed)
Infectious Diseases Initial Consultation        Day  2 clindamycin Her Date of Admission:  05/25/2011  Date of Consult:  05/26/2011  Reason for Consult: Evaluation of recurrent boils Referring Physician: Dr. Dierdre Forth   Problem List:  Active Problems:  Recurrent boils  Morbid obesity  Abnormal antenatal ultrasound  History of MRSA infection  HSV-2 (herpes simplex virus 2) infection  Polyhydramnios in third trimester  Anemia   Recommendations: 1. Change clindamycin to IV vancomycin pending culture results.   Assessment: She has had recurrent boils with mixed bacteria over the last 5 years. I favor switching to IV vancomycin which has more reliable activity against MRSA and will also cover other strains of staph and streptococci. Now that the largest one appears to be draining spontaneously she is likely to improve more rapidly. I will follow with you and monitor her culture results.    HPI: Krystal Hartman is a 20 y.o. female who is [redacted] weeks pregnant and was admitted yesterday after a low biophysical profile of 2/8 and was noted in the office. After admission she was noted have 2 boils on her left mid back. She states that she has a history of recurrent boils over the last 5 years. Frequently they drained spontaneously but she's had to have several lanced. Her last boil in September grew viridans strep. A culture in July grew MRSA. She denies having any fever, chills or sweats recently. Recurrent boils began about 3 days ago. After admission last night one area began to drain spontaneously.   Review of Systems: She denies anorexia, fever, chills, sweats or any history of dermatologic problems other than her boils.     . clindamycin  450 mg Oral Q6H  . clotrimazole  1 Applicatorful Vaginal QHS  . docusate sodium  100 mg Oral Daily  . prenatal vitamin w/FE, FA  1 tablet Oral Daily  . valACYclovir  500 mg Oral BID    Past Medical History  Diagnosis Date  . Hypertension    . MRSA (methicillin resistant Staphylococcus aureus) colonization     History  Substance Use Topics  . Smoking status: Never Smoker   . Smokeless tobacco: Never Used  . Alcohol Use: No    Family History  Problem Relation Age of Onset  . Diabetes Mother   . Diabetes Maternal Aunt    Allergies  Allergen Reactions  . Latex Hives and Itching  . Pork-Derived Products Other (See Comments)    Religous reasons does not eat    OBJECTIVE: Blood pressure 148/73, pulse 94, temperature 98.7 F (37.1 C), temperature source Oral, resp. rate 24, height 5\' 4"  (1.626 m), weight 175.542 kg (387 lb). General: She is comfortable and in no distress sitting up in bed. She is obese. Skin: There are 2 adjacent boils on her left mid back under a large skin pannus. One has some moderate yellow drainage. Both are fairly firm and about the diameter of the nickle. There is minimal surrounding cellulitis. They're not particularly tender to palpation.  Lungs: Clear Cor: Distant heart sounds with a regular S1 and S2 no murmurs. Abdomen: Obese with a gravid uterus    Results for orders placed during the hospital encounter of 05/25/11 (from the past 48 hour(s))  FETAL FIBRONECTIN     Status: Normal   Collection Time   05/25/11  9:05 PM      Component Value Range Comment   Fetal Fibronectin NEGATIVE  NEGATIVE    MRSA  PCR SCREENING     Status: Normal   Collection Time   05/25/11  9:05 PM      Component Value Range Comment   MRSA by PCR NEGATIVE  NEGATIVE    GC/CHLAMYDIA PROBE AMP, GENITAL     Status: Normal   Collection Time   05/25/11  9:05 PM      Component Value Range Comment   GC Probe Amp, Genital NEGATIVE  NEGATIVE     Chlamydia, DNA Probe NEGATIVE  NEGATIVE    URINALYSIS, ROUTINE W REFLEX MICROSCOPIC     Status: Abnormal   Collection Time   05/25/11 10:00 PM      Component Value Range Comment   Color, Urine ORANGE (*) YELLOW  BIOCHEMICALS MAY BE AFFECTED BY COLOR   APPearance CLEAR  CLEAR       Specific Gravity, Urine 1.025  1.005 - 1.030     pH 6.0  5.0 - 8.0     Glucose, UA NEGATIVE  NEGATIVE (mg/dL)    Hgb urine dipstick NEGATIVE  NEGATIVE     Bilirubin Urine NEGATIVE  NEGATIVE     Ketones, ur 15 (*) NEGATIVE (mg/dL)    Protein, ur NEGATIVE  NEGATIVE (mg/dL)    Urobilinogen, UA 0.2  0.0 - 1.0 (mg/dL)    Nitrite NEGATIVE  NEGATIVE     Leukocytes, UA NEGATIVE  NEGATIVE  MICROSCOPIC NOT DONE ON URINES WITH NEGATIVE PROTEIN, BLOOD, LEUKOCYTES, NITRITE, OR GLUCOSE <1000 mg/dL.  CBC     Status: Abnormal   Collection Time   05/25/11 10:35 PM      Component Value Range Comment   WBC 8.5  4.0 - 10.5 (K/uL)    RBC 3.75 (*) 3.87 - 5.11 (MIL/uL)    Hemoglobin 9.1 (*) 12.0 - 15.0 (g/dL)    HCT 04.5 (*) 40.9 - 46.0 (%)    MCV 76.8 (*) 78.0 - 100.0 (fL)    MCH 24.3 (*) 26.0 - 34.0 (pg)    MCHC 31.6  30.0 - 36.0 (g/dL)    RDW 81.1 (*) 91.4 - 15.5 (%)    Platelets 263  150 - 400 (K/uL)   RPR     Status: Normal   Collection Time   05/25/11 10:35 PM      Component Value Range Comment   RPR NON REACTIVE  NON REACTIVE    GLUCOSE, CAPILLARY     Status: Abnormal   Collection Time   05/25/11 11:05 PM      Component Value Range Comment   Glucose-Capillary 172 (*) 70 - 99 (mg/dL)   GLUCOSE, CAPILLARY     Status: Normal   Collection Time   05/26/11  6:32 AM      Component Value Range Comment   Glucose-Capillary 79  70 - 99 (mg/dL)   MRSA PCR SCREENING     Status: Abnormal   Collection Time   05/26/11  9:50 AM      Component Value Range Comment   MRSA by PCR POSITIVE (*) NEGATIVE    GLUCOSE, CAPILLARY     Status: Normal   Collection Time   05/26/11 12:42 PM      Component Value Range Comment   Glucose-Capillary 91  70 - 99 (mg/dL)    Comment 1 Notify RN         Component Value Date/Time   SDES URINE, CLEAN CATCH 08/22/2008 1101   SPECREQUEST NONE 08/22/2008 1101   CULT Multiple bacterial morphotypes present, none predominant. Suggest appropriate recollection if clinically  indicated.  08/22/2008 1101   REPTSTATUS 08/24/2008 FINAL 08/22/2008 1101   US Fetal Bpp W/o Non Stress  05/26/2011  OBSTETRICAL ULTRASOUND: This exam was performed within a [facility]. The OB US report was generated in the AS system, and faxed to the ordering physician.   This report is also available in [employer] and in the [employer]. See [facility] Obstetric US report.    Quandre Polinski [facility] [phone] 05/26/2011, 3:41 PM

## 2011-05-26 NOTE — Progress Notes (Signed)
Patient ID: Krystal Hartman, female   DOB: 1990/10/09, 20 y.o.   MRN: [id number] Pt without complaints.  No leakage of fluid or VB.  Good FM  BP 131/84  Pulse 91  Temp(Src) 98.2 F (36.8 C) (Oral)  Resp 20  Ht 5\' 4"  (1.626 m)  Wt 175.542 kg (387 lb)  BMI 66.43 kg/m2  FHTS Baseline: 125 bpm, Variability: Good {> 6 bpm), Accelerations: Reactive and Decelerations: Absent  Toco none  Pt in NAD CV RRR Lungs CTAB abd  Gravid soft and NT GU no vb EXt no calf tenderness Results for orders placed during the hospital encounter of 05/25/11 (from the past 72 hour(s))  FETAL FIBRONECTIN     Status: Normal   Collection Time   05/25/11  9:05 PM      Component Value Range Comment   Fetal Fibronectin NEGATIVE  NEGATIVE    MRSA PCR SCREENING     Status: Normal   Collection Time   05/25/11  9:05 PM      Component Value Range Comment   MRSA by PCR NEGATIVE  NEGATIVE    GC/CHLAMYDIA PROBE AMP, GENITAL     Status: Normal   Collection Time   05/25/11  9:05 PM      Component Value Range Comment   GC Probe Amp, Genital NEGATIVE  NEGATIVE     Chlamydia, DNA Probe NEGATIVE  NEGATIVE    URINALYSIS, ROUTINE W REFLEX MICROSCOPIC     Status: Abnormal   Collection Time   05/25/11 10:00 PM      Component Value Range Comment   Color, Urine ORANGE (*) YELLOW  BIOCHEMICALS MAY BE AFFECTED BY COLOR   APPearance CLEAR  CLEAR     Specific Gravity, Urine 1.025  1.005 - 1.030     pH 6.0  5.0 - 8.0     Glucose, UA NEGATIVE  NEGATIVE (mg/dL)    Hgb urine dipstick NEGATIVE  NEGATIVE     Bilirubin Urine NEGATIVE  NEGATIVE     Ketones, ur 15 (*) NEGATIVE (mg/dL)    Protein, ur NEGATIVE  NEGATIVE (mg/dL)    Urobilinogen, UA 0.2  0.0 - 1.0 (mg/dL)    Nitrite NEGATIVE  NEGATIVE     Leukocytes, UA NEGATIVE  NEGATIVE  MICROSCOPIC NOT DONE ON URINES WITH NEGATIVE PROTEIN, BLOOD, LEUKOCYTES, NITRITE, OR GLUCOSE <1000 mg/dL.  CBC     Status: Abnormal   Collection Time   05/25/11 10:35 PM      Component Value Range  Comment   WBC 8.5  4.0 - 10.5 (K/uL)    RBC 3.75 (*) 3.87 - 5.11 (MIL/uL)    Hemoglobin 9.1 (*) 12.0 - 15.0 (g/dL)    HCT 40.9 (*) 81.1 - 46.0 (%)    MCV 76.8 (*) 78.0 - 100.0 (fL)    MCH 24.3 (*) 26.0 - 34.0 (pg)    MCHC 31.6  30.0 - 36.0 (g/dL)    RDW 91.4 (*) 78.2 - 15.5 (%)    Platelets 263  150 - 400 (K/uL)   RPR     Status: Normal   Collection Time   05/25/11 10:35 PM      Component Value Range Comment   RPR NON REACTIVE  NON REACTIVE    GLUCOSE, CAPILLARY     Status: Abnormal   Collection Time   05/25/11 11:05 PM      Component Value Range Comment   Glucose-Capillary 172 (*) 70 - 99 (mg/dL)   GLUCOSE, CAPILLARY  Status: Normal   Collection Time   05/26/11  6:32 AM      Component Value Range Comment   Glucose-Capillary 79  70 - 99 (mg/dL)     Assessment and Plan [redacted]w[redacted]d  bpp 8/8 Wound cultures done on back and leg ID to see pt Repeat BPP qam with continuous monitoring

## 2011-05-27 ENCOUNTER — Inpatient Hospital Stay (HOSPITAL_COMMUNITY): Payer: Medicaid Other

## 2011-05-27 LAB — COMPREHENSIVE METABOLIC PANEL
AST: 61 U/L — ABNORMAL HIGH (ref 0–37)
Albumin: 2 g/dL — ABNORMAL LOW (ref 3.5–5.2)
BUN: 10 mg/dL (ref 6–23)
Chloride: 106 mEq/L (ref 96–112)
Creatinine, Ser: 0.6 mg/dL (ref 0.50–1.10)
Total Protein: 6.4 g/dL (ref 6.0–8.3)

## 2011-05-27 LAB — DIFFERENTIAL
Basophils Relative: 0 % (ref 0–1)
Eosinophils Absolute: 0.1 10*3/uL (ref 0.0–0.7)
Eosinophils Relative: 2 % (ref 0–5)
Lymphocytes Relative: 29 % (ref 12–46)
Monocytes Relative: 9 % (ref 3–12)
Neutro Abs: 4.4 10*3/uL (ref 1.7–7.7)
Neutrophils Relative %: 60 % (ref 43–77)

## 2011-05-27 LAB — GLUCOSE, CAPILLARY
Glucose-Capillary: 72 mg/dL (ref 70–99)
Glucose-Capillary: 132 mg/dL — ABNORMAL HIGH (ref 70–99)
Glucose-Capillary: 124 mg/dL — ABNORMAL HIGH (ref 70–99)

## 2011-05-27 LAB — CREATININE, SERUM
GFR calc Af Amer: >90 mL/min (ref >90)
GFR calc non Af Amer: >90 mL/min (ref >90)

## 2011-05-27 LAB — CBC
HCT: 27.8 % — ABNORMAL LOW (ref 36.0–46.0)
Hemoglobin: 8.7 g/dL — ABNORMAL LOW (ref 12.0–15.0)
MCV: 77.4 fL — ABNORMAL LOW (ref 78.0–100.0)
RBC: 3.59 MIL/uL — ABNORMAL LOW (ref 3.87–5.11)
WBC: 7.3 10*3/uL (ref 4.0–10.5)

## 2011-05-27 LAB — LACTATE DEHYDROGENASE: LDH: 141 U/L (ref 94–250)

## 2011-05-27 LAB — URIC ACID: Uric Acid, Serum: 4.7 mg/dL (ref 2.4–7.0)

## 2011-05-27 MED ORDER — AMOXICILLIN-POT CLAVULANATE 875-125 MG PO TABS
1.0000 | ORAL_TABLET | Freq: Two times a day (BID) | ORAL | Status: DC
  Administered 2011-05-27 – 2011-06-01 (×11): 1 via ORAL
  Filled 2011-05-27 (×13): qty 1

## 2011-05-27 NOTE — Progress Notes (Signed)
Patient serum creatinine 0.6mg /dL.  Continue current Vancomycin regimen.  Will check trough prior to 4th maintenance dose.

## 2011-05-27 NOTE — Progress Notes (Signed)
Patient ID: Krystal Hartman, female   DOB: [birth year], 20 y.o.   MRN: [id number] INFECTIOUS DISEASE PROGRESS NOTE   Date of Admission:  05/25/2011   Day 2 total antibiotics Day one vancomycin     . clotrimazole  1 Applicatorful Vaginal QHS  . docusate sodium  100 mg Oral Daily  . prenatal vitamin w/FE, FA  1 tablet Oral Daily  . valACYclovir  500 mg Oral BID  . vancomycin  1,500 mg Intravenous Q12H  . vancomycin  2,000 mg Intravenous Once  . DISCONTD: clindamycin  450 mg Oral Q6H    Subjective: She denies any pain and states that she's feeling better.  Objective: Temp (24hrs), Avg:98.6 F (37 C), Min:98.3 F (36.8 C), Max:98.8 F (37.1 C)    General: Awake but drowsy and in no distress Skin: She has a little bit of yellow-brown drainage on the gauze pad over the boils in her left mid back. The boils are slightly smaller and not particularly fluctuant. As a boil in her left groin with some drainage. It is nontender and not fluctuant Lungs: Clear Cor: Regular S1 and S2 no murmurs   Lab Results Lab Results  Component Value Date   WBC 8.5 05/25/2011   HGB 9.1* 05/25/2011   HCT 28.8* 05/25/2011   MCV 76.8* 05/25/2011   PLT 263 05/25/2011    Lab Results  Component Value Date   CREATININE 0.58 05/27/2011   CREATININE 0.60 05/27/2011   BUN 10 05/27/2011   NA 135 05/27/2011   K 4.1 05/27/2011   CL 106 05/27/2011   CO2 18* 05/27/2011    Lab Results  Component Value Date   ALT 62* 05/27/2011   AST 61* 05/27/2011   ALKPHOS 83 05/27/2011   BILITOT 0.1* 05/27/2011     Microbiology: Recent Results (from the past 240 hour(s))  MRSA PCR SCREENING     Status: Normal   Collection Time   05/25/11  9:05 PM      Component Value Range Status Comment   MRSA by PCR NEGATIVE  NEGATIVE  Final   MRSA PCR SCREENING     Status: Abnormal   Collection Time   05/26/11  9:50 AM      Component Value Range Status Comment   MRSA by PCR POSITIVE (*) NEGATIVE  Final   WOUND CULTURE     Status: Normal  (Preliminary result)   Collection Time   05/26/11 11:40 AM      Component Value Range Status Comment   Specimen Description LEG LEFT   Final    Special Requests Normal   Final    Gram Stain     Final    Value: NO WBC SEEN     NO SQUAMOUS EPITHELIAL CELLS SEEN     RARE GRAM POSITIVE RODS     RARE GRAM POSITIVE COCCI IN PAIRS   Culture NO GROWTH   Final    Report Status PENDING   Incomplete   WOUND CULTURE     Status: Normal (Preliminary result)   Collection Time   05/26/11 11:40 AM      Component Value Range Status Comment   Specimen Description BACK LEFT   Final    Special Requests Normal   Final    Gram Stain     Final    Value: RARE WBC PRESENT, PREDOMINANTLY PMN     MODERATE SQUAMOUS EPITHELIAL CELLS PRESENT     MODERATE GRAM NEGATIVE RODS     FEW GRAM POSITIVE  RODS     FEW GRAM POSITIVE COCCI IN PAIRS   Culture NO GROWTH   Final    Report Status PENDING   Incomplete     Studies/Results: US Fetal Bpp W/o Non Stress  05/27/2011  OBSTETRICAL ULTRASOUND: This exam was performed within a [facility]. The OB US report was generated in the AS system, and faxed to the ordering physician.   This report is also available in [employer] and in the [employer]. See [facility] Obstetric US report.   US Fetal Bpp W/o Non Stress  05/26/2011  OBSTETRICAL ULTRASOUND: This exam was performed within a [facility]. The OB US report was generated in the AS system, and faxed to the ordering physician.   This report is also available in [employer] and in the [employer]. See [facility] Obstetric US report.     Assessment: Gram stain of her left mid back boil drainage suggest a polymicrobial bacterial infection. This combined with the new boil in her left groin suggests that she may benefit from a little broader coverage for aerobic and anaerobic skin flora. I will and Augmentin to the IV vancomycin pending further observation  and final culture results.  Plan: 1. Continue vancomycin 2. Start Augmentin 3. I will check cultures tomorrow and followup if any changes are needed to her antibiotic therapy. Otherwise I will followup on December 10.   Dawnmarie Breon [facility] [phone] 05/27/2011, 11:49 AM

## 2011-05-27 NOTE — Progress Notes (Signed)
24 hr urine started

## 2011-05-27 NOTE — Progress Notes (Signed)
Bedside BPP

## 2011-05-27 NOTE — Progress Notes (Signed)
Patient ID: Krystal Hartman, female   DOB: [birth year], 20 y.o.   MRN: [id number] Pt without complaints.  No leakage of fluid or VB.  Good FM  BP 145/76  Pulse 88  Temp(Src) 99 F (37.2 C) (Oral)  Resp 20  Ht 5\' 4"  (1.626 m)  Wt 175.542 kg (387 lb)  BMI 66.43 kg/m2  SpO2 98%  FHTS Baseline: 130 bpm, Variability: Good {> 6 bpm), Accelerations: Reactive and Decelerations: Absent  Toco irregular, every 5-20 minutes  Pt in NAD CV RRR Lungs CTAB abd  Gravid soft and NT GU no vb EXt no calf tenderness Results for orders placed during the hospital encounter of 05/25/11 (from the past 72 hour(s))  FETAL FIBRONECTIN     Status: Normal   Collection Time   05/25/11  9:05 PM      Component Value Range Comment   Fetal Fibronectin NEGATIVE  NEGATIVE    MRSA PCR SCREENING     Status: Normal   Collection Time   05/25/11  9:05 PM      Component Value Range Comment   MRSA by PCR NEGATIVE  NEGATIVE    GC/CHLAMYDIA PROBE AMP, GENITAL     Status: Normal   Collection Time   05/25/11  9:05 PM      Component Value Range Comment   GC Probe Amp, Genital NEGATIVE  NEGATIVE     Chlamydia, DNA Probe NEGATIVE  NEGATIVE    URINALYSIS, ROUTINE W REFLEX MICROSCOPIC     Status: Abnormal   Collection Time   05/25/11 10:00 PM      Component Value Range Comment   Color, Urine ORANGE (*) YELLOW  BIOCHEMICALS MAY BE AFFECTED BY COLOR   APPearance CLEAR  CLEAR     Specific Gravity, Urine 1.025  1.005 - 1.030     pH 6.0  5.0 - 8.0     Glucose, UA NEGATIVE  NEGATIVE (mg/dL)    Hgb urine dipstick NEGATIVE  NEGATIVE     Bilirubin Urine NEGATIVE  NEGATIVE     Ketones, ur 15 (*) NEGATIVE (mg/dL)    Protein, ur NEGATIVE  NEGATIVE (mg/dL)    Urobilinogen, UA 0.2  0.0 - 1.0 (mg/dL)    Nitrite NEGATIVE  NEGATIVE     Leukocytes, UA NEGATIVE  NEGATIVE  MICROSCOPIC NOT DONE ON URINES WITH NEGATIVE PROTEIN, BLOOD, LEUKOCYTES, NITRITE, OR GLUCOSE <1000 mg/dL.  CBC     Status: Abnormal   Collection Time   05/25/11 10:35  PM      Component Value Range Comment   WBC 8.5  4.0 - 10.5 (K/uL)    RBC 3.75 (*) 3.87 - 5.11 (MIL/uL)    Hemoglobin 9.1 (*) 12.0 - 15.0 (g/dL)    HCT 13.0 (*) 86.5 - 46.0 (%)    MCV 76.8 (*) 78.0 - 100.0 (fL)    MCH 24.3 (*) 26.0 - 34.0 (pg)    MCHC 31.6  30.0 - 36.0 (g/dL)    RDW 78.4 (*) 69.6 - 15.5 (%)    Platelets 263  150 - 400 (K/uL)   RPR     Status: Normal   Collection Time   05/25/11 10:35 PM      Component Value Range Comment   RPR NON REACTIVE  NON REACTIVE    GLUCOSE, CAPILLARY     Status: Abnormal   Collection Time   05/25/11 11:05 PM      Component Value Range Comment   Glucose-Capillary 172 (*) 70 - 99 (mg/dL)  GLUCOSE, CAPILLARY     Status: Normal   Collection Time   05/26/11  6:32 AM      Component Value Range Comment   Glucose-Capillary 79  70 - 99 (mg/dL)   MRSA PCR SCREENING     Status: Abnormal   Collection Time   05/26/11  9:50 AM      Component Value Range Comment   MRSA by PCR POSITIVE (*) NEGATIVE    WOUND CULTURE     Status: Normal (Preliminary result)   Collection Time   05/26/11 11:40 AM      Component Value Range Comment   Specimen Description LEG LEFT      Special Requests Normal      Gram Stain        Value: NO WBC SEEN     NO SQUAMOUS EPITHELIAL CELLS SEEN     RARE GRAM POSITIVE RODS     RARE GRAM POSITIVE COCCI IN PAIRS   Culture NO GROWTH      Report Status PENDING     WOUND CULTURE     Status: Normal (Preliminary result)   Collection Time   05/26/11 11:40 AM      Component Value Range Comment   Specimen Description BACK LEFT      Special Requests Normal      Gram Stain        Value: RARE WBC PRESENT, PREDOMINANTLY PMN     MODERATE SQUAMOUS EPITHELIAL CELLS PRESENT     MODERATE GRAM NEGATIVE RODS     FEW GRAM POSITIVE RODS     FEW GRAM POSITIVE COCCI IN PAIRS   Culture NO GROWTH      Report Status PENDING     GLUCOSE, CAPILLARY     Status: Normal   Collection Time   05/26/11 12:42 PM      Component Value Range Comment    Glucose-Capillary 91  70 - 99 (mg/dL)    Comment 1 Notify RN     GLUCOSE, CAPILLARY     Status: Abnormal   Collection Time   05/26/11  6:31 PM      Component Value Range Comment   Glucose-Capillary 123 (*) 70 - 99 (mg/dL)   GLUCOSE, CAPILLARY     Status: Abnormal   Collection Time   05/26/11 11:56 PM      Component Value Range Comment   Glucose-Capillary 136 (*) 70 - 99 (mg/dL)   CREATININE, SERUM     Status: Normal   Collection Time   05/27/11  5:14 AM      Component Value Range Comment   Creatinine, Ser 0.58  0.50 - 1.10 (mg/dL)    GFR calc non Af Amer >90  >90 (mL/min)    GFR calc Af Amer >90  >90 (mL/min)   URIC ACID     Status: Normal   Collection Time   05/27/11  5:14 AM      Component Value Range Comment   Uric Acid, Serum 4.7  2.4 - 7.0 (mg/dL)   COMPREHENSIVE METABOLIC PANEL     Status: Abnormal   Collection Time   05/27/11  5:14 AM      Component Value Range Comment   Sodium 135  135 - 145 (mEq/L)    Potassium 4.1  3.5 - 5.1 (mEq/L)    Chloride 106  96 - 112 (mEq/L)    CO2 18 (*) 19 - 32 (mEq/L)    Glucose, Bld 76  70 - 99 (mg/dL)  BUN 10  6 - 23 (mg/dL)    Creatinine, Ser 1.61  0.50 - 1.10 (mg/dL)    Calcium 8.6  8.4 - 10.5 (mg/dL)    Total Protein 6.4  6.0 - 8.3 (g/dL)    Albumin 2.0 (*) 3.5 - 5.2 (g/dL)    AST 61 (*) 0 - 37 (U/L)    ALT 62 (*) 0 - 35 (U/L)    Alkaline Phosphatase 83  39 - 117 (U/L)    Total Bilirubin 0.1 (*) 0.3 - 1.2 (mg/dL)    GFR calc non Af Amer >90  >90 (mL/min)    GFR calc Af Amer >90  >90 (mL/min)   LACTATE DEHYDROGENASE     Status: Normal   Collection Time   05/27/11  5:14 AM      Component Value Range Comment   LD 141  94 - 250 (U/L)   CBC     Status: Abnormal   Collection Time   05/27/11  5:14 AM      Component Value Range Comment   WBC 7.3  4.0 - 10.5 (K/uL)    RBC 3.59 (*) 3.87 - 5.11 (MIL/uL)    Hemoglobin 8.7 (*) 12.0 - 15.0 (g/dL)    HCT 09.6 (*) 04.5 - 46.0 (%)    MCV 77.4 (*) 78.0 - 100.0 (fL)    MCH 24.2 (*) 26.0 -  34.0 (pg)    MCHC 31.3  30.0 - 36.0 (g/dL)    RDW 40.9 (*) 81.1 - 15.5 (%)    Platelets 273  150 - 400 (K/uL)   DIFFERENTIAL     Status: Normal   Collection Time   05/27/11  5:14 AM      Component Value Range Comment   Neutrophils Relative 60  43 - 77 (%)    Lymphocytes Relative 29  12 - 46 (%)    Monocytes Relative 9  3 - 12 (%)    Eosinophils Relative 2  0 - 5 (%)    Basophils Relative 0  0 - 1 (%)    Neutro Abs 4.4  1.7 - 7.7 (K/uL)    Lymphs Abs 2.1  0.7 - 4.0 (K/uL)    Monocytes Absolute 0.7  0.1 - 1.0 (K/uL)    Eosinophils Absolute 0.1  0.0 - 0.7 (K/uL)    Basophils Absolute 0.0  0.0 - 0.1 (K/uL)   GLUCOSE, CAPILLARY     Status: Normal   Collection Time   05/27/11  6:46 AM      Component Value Range Comment   Glucose-Capillary 72  70 - 99 (mg/dL)   GLUCOSE, CAPILLARY     Status: Normal   Collection Time   05/27/11  1:22 PM      Component Value Range Comment   Glucose-Capillary 79  70 - 99 (mg/dL)     Assessment and Plan [redacted]w[redacted]d  MRSA pt now on two abx continue to follow with ID CHTN bp labile, LFTS increased recheck labs tomorrow.  Will not start antihypertensive now, but may need to .  BPP tomorrow Await 24 hour urine results tomorrow dialy weights Monitor closely

## 2011-05-27 NOTE — Progress Notes (Addendum)
Hospital day # 2 pregnancy at [redacted]w[redacted]d  S:  Reports feeling well.  Reports good fetal activity.  Denies headaches, vision chgs, RUQ pain or edema.  Tol po      liquids and solids without difficulty.        Contractions:reports occas cramping but not painful.      Vaginal bleeding:none       Vaginal discharge: none  O: BP 136/79  Pulse 87  Temp(Src) 98.5 F (36.9 C) (Oral)  Resp 20  Ht 5\' 4"  (1.626 m)  Wt 175.542 kg (387 lb)  BMI 66.43 kg/m2       Filed Vitals:   05/26/11 1950 05/26/11 2356 05/27/11 0450 05/27/11 0904  BP: 145/78 121/67 136/79   Pulse: 88 88 87   Temp:  98.3 F (36.8 C) 98.7 F (37.1 C) 98.5 F (36.9 C)  TempSrc:  Oral Oral Oral  Resp:  20 20   Height:      Weight:       CBG (last 3)   Basename 05/27/11 0646 05/26/11 2356 05/26/11 1831  GLUCAP 72 136* 123*   Gen:  Alert, oriented, NAD.   Skin W&D, color satis. Heart:  RRR Lungs:  CTA bilat Abd:  Soft, obese, NT, pos BS x 4 quads Fetal tracings:Fetal heart variability: moderate, minimal Fetal Heart Rate decelerations: none Fetal Heart Rate accelerations: yes Baseline FHR: 135 per minute Fetal Non-stress Test: reactive Biophysical Profile: BPP Score 8/8 this AM (just completed) reviewed and reassuring Irreg UCs on toco.        Uterus gravid and non-tender      Extremities: extremities normal, atraumatic, no cyanosis or edema, Homans sign is negative, no sign of DVT and no significant edema and no signs of DVT DTRs 1+, no clonus Lesions of lt back firm, without redness, draining cloudy yellow liquid.    A: [redacted]w[redacted]d with non-reassuring antepartum testing-resolved     Recurrent boils-current     MRSA     Chronic HTN-no medications     Polyhydramnios     Elevated BP-intermittent      Stable  P: Per consult with Dr. Normand Sloop, will begin 24hr urine for total protein and check PIH labs.       Continue current care.    Mallary Kreger O., CNM 05/27/2011 10:33 AM

## 2011-05-28 ENCOUNTER — Inpatient Hospital Stay (HOSPITAL_COMMUNITY): Payer: Medicaid Other

## 2011-05-28 LAB — GLUCOSE, CAPILLARY
Glucose-Capillary: 87 mg/dL (ref 70–99)
Glucose-Capillary: 113 mg/dL — ABNORMAL HIGH (ref 70–99)
Glucose-Capillary: 146 mg/dL — ABNORMAL HIGH (ref 70–99)

## 2011-05-28 LAB — COMPREHENSIVE METABOLIC PANEL
ALT: 63 U/L — ABNORMAL HIGH (ref 0–35)
AST: 58 U/L — ABNORMAL HIGH (ref 0–37)
CO2: 22 mEq/L (ref 19–32)
Chloride: 107 mEq/L (ref 96–112)
Creatinine, Ser: 0.67 mg/dL (ref 0.50–1.10)
GFR calc Af Amer: >90 mL/min (ref >90)
GFR calc non Af Amer: >90 mL/min (ref >90)
Glucose, Bld: 98 mg/dL (ref 70–99)
Total Bilirubin: 0.1 mg/dL — ABNORMAL LOW (ref 0.3–1.2)

## 2011-05-28 LAB — LACTATE DEHYDROGENASE: LDH: 103 U/L (ref 94–250)

## 2011-05-28 LAB — CBC
HCT: 26.9 % — ABNORMAL LOW (ref 36.0–46.0)
Hemoglobin: 8.4 g/dL — ABNORMAL LOW (ref 12.0–15.0)
MCH: 24 pg — ABNORMAL LOW (ref 26.0–34.0)
RBC: 3.5 MIL/uL — ABNORMAL LOW (ref 3.87–5.11)

## 2011-05-28 LAB — VANCOMYCIN, TROUGH: Vancomycin Tr: 5.6 ug/mL — ABNORMAL LOW (ref 10.0–20.0)

## 2011-05-28 MED ORDER — VANCOMYCIN HCL 1000 MG IV SOLR
1500.0000 mg | Freq: Three times a day (TID) | INTRAVENOUS | Status: DC
  Administered 2011-05-29 (×2): 1500 mg via INTRAVENOUS
  Filled 2011-05-28 (×4): qty 1500

## 2011-05-28 NOTE — Progress Notes (Addendum)
Patient ID: Krystal Hartman, female   DOB: [birth year], 20 y.o.   MRN: [id number] Pt without complaints.  No leakage of fluid or VB.  Good FM.  Bo HA, blurred vision or RUQ pain  BP 129/69  Pulse 92  Temp(Src) 98.6 F (37 C) (Oral)  Resp 20  Ht 5\' 4"  (1.626 m)  Wt 175.542 kg (387 lb)  BMI 66.43 kg/m2  SpO2 98%  FHTS Baseline: 120-125 bpm, moderate LTV with accels  Toco none  Pt in NAD CV RRR Lungs CTAB abd  Gravid soft and NT GU no vb EXt no calf tenderness Boil on back and groin are healing.  Very little drainage Results for orders placed during the hospital encounter of 05/25/11 (from the past 72 hour(s))  FETAL FIBRONECTIN     Status: Normal   Collection Time   05/25/11  9:05 PM      Component Value Range Comment   Fetal Fibronectin NEGATIVE  NEGATIVE    MRSA PCR SCREENING     Status: Normal   Collection Time   05/25/11  9:05 PM      Component Value Range Comment   MRSA by PCR NEGATIVE  NEGATIVE    GC/CHLAMYDIA PROBE AMP, GENITAL     Status: Normal   Collection Time   05/25/11  9:05 PM      Component Value Range Comment   GC Probe Amp, Genital NEGATIVE  NEGATIVE     Chlamydia, DNA Probe NEGATIVE  NEGATIVE    URINALYSIS, ROUTINE W REFLEX MICROSCOPIC     Status: Abnormal   Collection Time   05/25/11 10:00 PM      Component Value Range Comment   Color, Urine ORANGE (*) YELLOW  BIOCHEMICALS MAY BE AFFECTED BY COLOR   APPearance CLEAR  CLEAR     Specific Gravity, Urine 1.025  1.005 - 1.030     pH 6.0  5.0 - 8.0     Glucose, UA NEGATIVE  NEGATIVE (mg/dL)    Hgb urine dipstick NEGATIVE  NEGATIVE     Bilirubin Urine NEGATIVE  NEGATIVE     Ketones, ur 15 (*) NEGATIVE (mg/dL)    Protein, ur NEGATIVE  NEGATIVE (mg/dL)    Urobilinogen, UA 0.2  0.0 - 1.0 (mg/dL)    Nitrite NEGATIVE  NEGATIVE     Leukocytes, UA NEGATIVE  NEGATIVE  MICROSCOPIC NOT DONE ON URINES WITH NEGATIVE PROTEIN, BLOOD, LEUKOCYTES, NITRITE, OR GLUCOSE <1000 mg/dL.  CBC     Status: Abnormal   Collection  Time   05/25/11 10:35 PM      Component Value Range Comment   WBC 8.5  4.0 - 10.5 (K/uL)    RBC 3.75 (*) 3.87 - 5.11 (MIL/uL)    Hemoglobin 9.1 (*) 12.0 - 15.0 (g/dL)    HCT 40.9 (*) 81.1 - 46.0 (%)    MCV 76.8 (*) 78.0 - 100.0 (fL)    MCH 24.3 (*) 26.0 - 34.0 (pg)    MCHC 31.6  30.0 - 36.0 (g/dL)    RDW 91.4 (*) 78.2 - 15.5 (%)    Platelets 263  150 - 400 (K/uL)   RPR     Status: Normal   Collection Time   05/25/11 10:35 PM      Component Value Range Comment   RPR NON REACTIVE  NON REACTIVE    GLUCOSE, CAPILLARY     Status: Abnormal   Collection Time   05/25/11 11:05 PM      Component Value Range Comment  Glucose-Capillary 172 (*) 70 - 99 (mg/dL)   GLUCOSE, CAPILLARY     Status: Normal   Collection Time   05/26/11  6:32 AM      Component Value Range Comment   Glucose-Capillary 79  70 - 99 (mg/dL)   MRSA PCR SCREENING     Status: Abnormal   Collection Time   05/26/11  9:50 AM      Component Value Range Comment   MRSA by PCR POSITIVE (*) NEGATIVE    WOUND CULTURE     Status: Normal (Preliminary result)   Collection Time   05/26/11 11:40 AM      Component Value Range Comment   Specimen Description LEG LEFT      Special Requests Normal      Gram Stain        Value: NO WBC SEEN     NO SQUAMOUS EPITHELIAL CELLS SEEN     RARE GRAM POSITIVE RODS     RARE GRAM POSITIVE COCCI IN PAIRS   Culture        Value: FEW DIPHTHEROIDS(CORYNEBACTERIUM SPECIES)     Note: Standardized susceptibility testing for this organism is not available.   Report Status PENDING     WOUND CULTURE     Status: Normal (Preliminary result)   Collection Time   05/26/11 11:40 AM      Component Value Range Comment   Specimen Description BACK LEFT      Special Requests Normal      Gram Stain        Value: RARE WBC PRESENT, PREDOMINANTLY PMN     MODERATE SQUAMOUS EPITHELIAL CELLS PRESENT     MODERATE GRAM NEGATIVE RODS     FEW GRAM POSITIVE RODS     FEW GRAM POSITIVE COCCI IN PAIRS   Culture        Value:  FEW DIPHTHEROIDS(CORYNEBACTERIUM SPECIES)     Note: Standardized susceptibility testing for this organism is not available.   Report Status PENDING     GLUCOSE, CAPILLARY     Status: Normal   Collection Time   05/26/11 12:42 PM      Component Value Range Comment   Glucose-Capillary 91  70 - 99 (mg/dL)    Comment 1 Notify RN     GLUCOSE, CAPILLARY     Status: Abnormal   Collection Time   05/26/11  6:31 PM      Component Value Range Comment   Glucose-Capillary 123 (*) 70 - 99 (mg/dL)   GLUCOSE, CAPILLARY     Status: Abnormal   Collection Time   05/26/11 11:56 PM      Component Value Range Comment   Glucose-Capillary 136 (*) 70 - 99 (mg/dL)   CREATININE, SERUM     Status: Normal   Collection Time   05/27/11  5:14 AM      Component Value Range Comment   Creatinine, Ser 0.58  0.50 - 1.10 (mg/dL)    GFR calc non Af Amer >90  >90 (mL/min)    GFR calc Af Amer >90  >90 (mL/min)   URIC ACID     Status: Normal   Collection Time   05/27/11  5:14 AM      Component Value Range Comment   Uric Acid, Serum 4.7  2.4 - 7.0 (mg/dL)   COMPREHENSIVE METABOLIC PANEL     Status: Abnormal   Collection Time   05/27/11  5:14 AM      Component Value Range Comment   Sodium 135  135 - 145 (mEq/L)    Potassium 4.1  3.5 - 5.1 (mEq/L)    Chloride 106  96 - 112 (mEq/L)    CO2 18 (*) 19 - 32 (mEq/L)    Glucose, Bld 76  70 - 99 (mg/dL)    BUN 10  6 - 23 (mg/dL)    Creatinine, Ser 1.61  0.50 - 1.10 (mg/dL)    Calcium 8.6  8.4 - 10.5 (mg/dL)    Total Protein 6.4  6.0 - 8.3 (g/dL)    Albumin 2.0 (*) 3.5 - 5.2 (g/dL)    AST 61 (*) 0 - 37 (U/L)    ALT 62 (*) 0 - 35 (U/L)    Alkaline Phosphatase 83  39 - 117 (U/L)    Total Bilirubin 0.1 (*) 0.3 - 1.2 (mg/dL)    GFR calc non Af Amer >90  >90 (mL/min)    GFR calc Af Amer >90  >90 (mL/min)   LACTATE DEHYDROGENASE     Status: Normal   Collection Time   05/27/11  5:14 AM      Component Value Range Comment   LD 141  94 - 250 (U/L)   CBC     Status: Abnormal    Collection Time   05/27/11  5:14 AM      Component Value Range Comment   WBC 7.3  4.0 - 10.5 (K/uL)    RBC 3.59 (*) 3.87 - 5.11 (MIL/uL)    Hemoglobin 8.7 (*) 12.0 - 15.0 (g/dL)    HCT 09.6 (*) 04.5 - 46.0 (%)    MCV 77.4 (*) 78.0 - 100.0 (fL)    MCH 24.2 (*) 26.0 - 34.0 (pg)    MCHC 31.3  30.0 - 36.0 (g/dL)    RDW 40.9 (*) 81.1 - 15.5 (%)    Platelets 273  150 - 400 (K/uL)   DIFFERENTIAL     Status: Normal   Collection Time   05/27/11  5:14 AM      Component Value Range Comment   Neutrophils Relative 60  43 - 77 (%)    Lymphocytes Relative 29  12 - 46 (%)    Monocytes Relative 9  3 - 12 (%)    Eosinophils Relative 2  0 - 5 (%)    Basophils Relative 0  0 - 1 (%)    Neutro Abs 4.4  1.7 - 7.7 (K/uL)    Lymphs Abs 2.1  0.7 - 4.0 (K/uL)    Monocytes Absolute 0.7  0.1 - 1.0 (K/uL)    Eosinophils Absolute 0.1  0.0 - 0.7 (K/uL)    Basophils Absolute 0.0  0.0 - 0.1 (K/uL)   GLUCOSE, CAPILLARY     Status: Normal   Collection Time   05/27/11  6:46 AM      Component Value Range Comment   Glucose-Capillary 72  70 - 99 (mg/dL)   GLUCOSE, CAPILLARY     Status: Normal   Collection Time   05/27/11  1:22 PM      Component Value Range Comment   Glucose-Capillary 79  70 - 99 (mg/dL)   GLUCOSE, CAPILLARY     Status: Abnormal   Collection Time   05/27/11  6:31 PM      Component Value Range Comment   Glucose-Capillary 132 (*) 70 - 99 (mg/dL)   GLUCOSE, CAPILLARY     Status: Abnormal   Collection Time   05/27/11 10:40 PM      Component Value Range Comment   Glucose-Capillary  124 (*) 70 - 99 (mg/dL)   CBC     Status: Abnormal   Collection Time   05/28/11  5:00 AM      Component Value Range Comment   WBC 6.0  4.0 - 10.5 (K/uL)    RBC 3.50 (*) 3.87 - 5.11 (MIL/uL)    Hemoglobin 8.4 (*) 12.0 - 15.0 (g/dL)    HCT 95.6 (*) 21.3 - 46.0 (%)    MCV 76.9 (*) 78.0 - 100.0 (fL)    MCH 24.0 (*) 26.0 - 34.0 (pg)    MCHC 31.2  30.0 - 36.0 (g/dL)    RDW 08.6 (*) 57.8 - 15.5 (%)    Platelets 231  150 -  400 (K/uL)   LACTATE DEHYDROGENASE     Status: Normal   Collection Time   05/28/11  5:00 AM      Component Value Range Comment   LD 103  94 - 250 (U/L)   COMPREHENSIVE METABOLIC PANEL     Status: Abnormal   Collection Time   05/28/11  5:00 AM      Component Value Range Comment   Sodium 136  135 - 145 (mEq/L)    Potassium 4.1  3.5 - 5.1 (mEq/L)    Chloride 107  96 - 112 (mEq/L)    CO2 22  19 - 32 (mEq/L)    Glucose, Bld 98  70 - 99 (mg/dL)    BUN 9  6 - 23 (mg/dL)    Creatinine, Ser 4.69  0.50 - 1.10 (mg/dL)    Calcium 8.6  8.4 - 10.5 (mg/dL)    Total Protein 6.7  6.0 - 8.3 (g/dL)    Albumin 2.0 (*) 3.5 - 5.2 (g/dL)    AST 58 (*) 0 - 37 (U/L)    ALT 63 (*) 0 - 35 (U/L)    Alkaline Phosphatase 75  39 - 117 (U/L)    Total Bilirubin 0.1 (*) 0.3 - 1.2 (mg/dL)    GFR calc non Af Amer >90  >90 (mL/min)    GFR calc Af Amer >90  >90 (mL/min)   URIC ACID     Status: Normal   Collection Time   05/28/11  5:00 AM      Component Value Range Comment   Uric Acid, Serum 4.8  2.4 - 7.0 (mg/dL)   GLUCOSE, CAPILLARY     Status: Normal   Collection Time   05/28/11  6:43 AM      Component Value Range Comment   Glucose-Capillary 81  70 - 99 (mg/dL)     Assessment and Plan [redacted]w[redacted]d  CHTN BPs are slowly elevating.  Will consult with MFM on when to start antihypertensive.  24 hour protein pending.  LFT elevated continue to monitor pt asymptomatic bpp today  Boils with MRSA healing continue antibiotics   CHTN blood pressure is labile.  24 hour urine TP 156.  LFts are elvated.  Would consult MFM in am ask about antihypertensive and can pt be monitored as an outpatient MRSA consult ID on what to send pt home on if pt can go home for outpatietn mangement

## 2011-05-28 NOTE — Progress Notes (Signed)
ANTIBIOTIC CONSULT NOTE - FOLLOW UP  Pharmacy Consult for : Vancomycin Trough Indication: Boils & h/o MRSA colonization  Allergies  Allergen Reactions  . Latex Hives and Itching  . Pork-Derived Products Other (See Comments)    Religous reasons does not eat    Patient Measurements: Height: 5\' 4"  (162.6 cm) Weight: 393 lb 11.2 oz (178.581 kg) IBW/kg (Calculated) : 54.7  Adjusted Body Weight  Vital Signs: Temp: 99.1 F (37.3 C) (12/09 2007) Temp src: Oral (12/09 2007) BP: 149/82 mmHg (12/09 2007) Pulse Rate: 101  (12/09 2007) Intake/Output from previous day: 12/08 0701 - 12/09 0700 In: 2723.3 [P.O.:240; I.V.:1983.3; IV Piggyback:500] Out: 200 [Urine:200]    Labs:  [facility] 05/28/11 0500 05/27/11 0514 05/25/11 2235  WBC 6.0 7.3 8.5  HGB 8.4* 8.7* 9.1*  PLT 231 273 263  LABCREA -- -- --  CREATININE 0.67 0.580.60 --   Estimated Creatinine Clearance: 181.6 ml/min (by C-G formula based on Cr of 0.67).    Microbiology: Recent Results (from the past 720 hour(s))  MRSA PCR SCREENING     Status: Normal   Collection Time   05/25/11  9:05 PM      Component Value Range Status Comment   MRSA by PCR NEGATIVE  NEGATIVE  Final   MRSA PCR SCREENING     Status: Abnormal   Collection Time   05/26/11  9:50 AM      Component Value Range Status Comment   MRSA by PCR POSITIVE (*) NEGATIVE  Final   WOUND CULTURE     Status: Normal (Preliminary result)   Collection Time   05/26/11 11:40 AM      Component Value Range Status Comment   Specimen Description LEG LEFT   Final    Special Requests Normal   Final    Gram Stain     Final    Value: NO WBC SEEN     NO SQUAMOUS EPITHELIAL CELLS SEEN     RARE GRAM POSITIVE RODS     RARE GRAM POSITIVE COCCI IN PAIRS   Culture     Final    Value: FEW DIPHTHEROIDS(CORYNEBACTERIUM SPECIES)     Note: Standardized susceptibility testing for this organism is not available.   Report Status PENDING   Incomplete   WOUND CULTURE     Status: Normal  (Preliminary result)   Collection Time   05/26/11 11:40 AM      Component Value Range Status Comment   Specimen Description BACK LEFT   Final    Special Requests Normal   Final    Gram Stain     Final    Value: RARE WBC PRESENT, PREDOMINANTLY PMN     MODERATE SQUAMOUS EPITHELIAL CELLS PRESENT     MODERATE GRAM NEGATIVE RODS     FEW GRAM POSITIVE RODS     FEW GRAM POSITIVE COCCI IN PAIRS   Culture     Final    Value: FEW DIPHTHEROIDS(CORYNEBACTERIUM SPECIES)     Note: Standardized susceptibility testing for this organism is not available.   Report Status PENDING   Incomplete     Anti-infectives     Start     Dose/Rate Route Frequency Ordered Stop   05/29/11 0130   vancomycin (VANCOCIN) 1,500 mg in sodium chloride 0.9 % 500 mL IVPB        1,500 mg 250 mL/hr over 120 Minutes Intravenous Every 8 hours 05/28/11 2112     05/27/11 1200  amoxicillin-clavulanate (AUGMENTIN) 875-125 MG per tablet 1  tablet       1 tablet Oral Every 12 hours 05/27/11 1154     05/27/11 0500   vancomycin (VANCOCIN) 1,500 mg in sodium chloride 0.9 % 500 mL IVPB  Status:  Discontinued        1,500 mg 250 mL/hr over 120 Minutes Intravenous Every 12 hours 05/26/11 1625 05/28/11 2112   05/26/11 1700   vancomycin (VANCOCIN) 2,000 mg in sodium chloride 0.9 % 500 mL IVPB        2,000 mg 250 mL/hr over 120 Minutes Intravenous  Once 05/26/11 1625 05/26/11 1930   05/26/11 0000   clindamycin (CLEOCIN) capsule 450 mg  Status:  Discontinued        450 mg Oral 4 times per day 05/25/11 2159 05/26/11 1555   05/25/11 2200   valACYclovir (VALTREX) tablet 500 mg        500 mg Oral 2 times daily 05/25/11 2147 05/30/11 2159          Assessment: Vancomycin trough reported as 5.6 mcg/ml drawn at 1628 today.  This is below the therapeutic range, so adjustment is needed.  Dosing may be affected by morbid obesity and CrCl= 168ml/min.    Pt. Is improving, and boils appear to be healing with antibiotics.   Goal of Therapy:  Trough ~ 15-53mcg/ml  Plan: Increase frequency to Vancomycin 1500mg  IV Q8 hours to begin at 0130 05/29/11.  If continued, will need to check ss trough again.     Thank You,           Hovey-Rankin, Darah Simkin 05/28/2011,9:50 PM

## 2011-05-29 ENCOUNTER — Inpatient Hospital Stay (HOSPITAL_COMMUNITY): Payer: Medicaid Other

## 2011-05-29 LAB — GLUCOSE, CAPILLARY
Glucose-Capillary: 93 mg/dL (ref 70–99)
Glucose-Capillary: 81 mg/dL (ref 70–99)
Glucose-Capillary: 98 mg/dL (ref 70–99)

## 2011-05-29 LAB — COMPREHENSIVE METABOLIC PANEL
ALT: 71 U/L — ABNORMAL HIGH (ref 0–35)
CO2: 22 mEq/L (ref 19–32)
Calcium: 8.5 mg/dL (ref 8.4–10.5)
Creatinine, Ser: 0.65 mg/dL (ref 0.50–1.10)
GFR calc Af Amer: >90 mL/min (ref >90)
GFR calc non Af Amer: >90 mL/min (ref >90)
Glucose, Bld: 90 mg/dL (ref 70–99)
Sodium: 136 mEq/L (ref 135–145)
Total Protein: 6.3 g/dL (ref 6.0–8.3)

## 2011-05-29 LAB — LACTATE DEHYDROGENASE: LDH: 157 U/L (ref 94–250)

## 2011-05-29 LAB — CBC
HCT: 26.1 % — ABNORMAL LOW (ref 36.0–46.0)
MCH: 24 pg — ABNORMAL LOW (ref 26.0–34.0)
MCHC: 31.4 g/dL (ref 30.0–36.0)
RDW: 18 % — ABNORMAL HIGH (ref 11.5–15.5)

## 2011-05-29 LAB — PROTEIN, URINE, 24 HOUR
Protein, 24H Urine: 156 mg/d — ABNORMAL HIGH (ref 50–100)
Protein, Urine: 5 mg/dL
Urine Total Volume-UPROT: 3120 mL

## 2011-05-29 LAB — WOUND CULTURE: Special Requests: NORMAL

## 2011-05-29 MED ORDER — MUPIROCIN 2 % EX OINT
1.0000 "application " | TOPICAL_OINTMENT | Freq: Two times a day (BID) | CUTANEOUS | Status: DC
  Administered 2011-05-29 – 2011-06-01 (×7): 1 via NASAL
  Filled 2011-05-29: qty 22

## 2011-05-29 MED ORDER — CHLORHEXIDINE GLUCONATE CLOTH 2 % EX PADS
6.0000 | MEDICATED_PAD | Freq: Every day | CUTANEOUS | Status: DC
  Administered 2011-05-29 – 2011-06-01 (×3): 6 via TOPICAL

## 2011-05-29 NOTE — Progress Notes (Signed)
Hospital day # 4 pregnancy at [redacted]w[redacted]d  S: well, reports good fetal activity      Contractions:none      Vaginal bleeding:none now       Vaginal discharge: no significant change Denies headache, visual symptoms or epigastric pain. Also denies edema.  O: BP 142/77  Pulse 100  Temp(Src) 98.9 F (37.2 C) (Oral)  Resp 22  Ht 5\' 4"  (1.626 m)  Wt 178.581 kg (393 lb 11.2 oz)  BMI 67.58 kg/m2  SpO2 98%       BP are very labile: 130-156/75-98      Fetal tracings:reviewed and reassuring      Uterus gravid and non-tender      Extremities: no significant edema and no signs of DVT  Ultrasound: BPP 8/8 with mild polyhydramnios (unexplained)  Labs: AST 66 (58)           ALT 71 (66)           LDH  157           Hgb 8.2           Platelets 269  A: [redacted]w[redacted]d with CHTN      Unexplained elevation of liver enzymes: chronic fatty liver 2nd morbid obesity vs early signs of atypical pre-eclampsia      Unexplained polyhydramnios with normal 3 hr GTTX2. Elevated CBG during this admission.      MRSA infection on Vancomycin #3 Augmentin #3 ATB #4      Previous cesarean section with a calculated VBAC success chance of 17 %      Morbid obesity with BMI 65.2  P: Plan of care discussed with Dr Rachel Bo     Continue inpatient surveillance to R/O superimposed pre-eclampsia      Start diabetic diet      Continue ATB per Dr Blair Dolphin recommendations      Discussed mode of delivery and suggest repeat cesarean section to maintain a controlled environment  Zackari Ruane A  MD 05/29/2011 12:22 PM

## 2011-05-29 NOTE — Progress Notes (Signed)
SW received consult to see patient due to upcoming court dates.  Patient states she anticipates d/c tomorrow, and has no other issues other than missing traffic court today.  SW offered to write a letter stating that patient was in the hospital for her to take to court when she goes.  She agreed.  SW provided her with a letter verifying her hospital stay.

## 2011-05-29 NOTE — Progress Notes (Signed)
Patient ID: Krystal Hartman, female   DOB: May 21, 1991, 20 y.o.   MRN: [id number] INFECTIOUS DISEASE PROGRESS NOTE   Date of Admission:  05/25/2011   Day 4 total antibiotics Day 3 vancomycin Day 2 Augmentin     . amoxicillin-clavulanate  1 tablet Oral Q12H  . Chlorhexidine Gluconate Cloth  6 each Topical Q0600  . docusate sodium  100 mg Oral Daily  . mupirocin ointment  1 application Nasal BID  . prenatal vitamin w/FE, FA  1 tablet Oral Daily  . valACYclovir  500 mg Oral BID  . vancomycin  1,500 mg Intravenous Q8H  . DISCONTD: vancomycin  1,500 mg Intravenous Q12H    Subjective: She is feeling better. She feels her boils are smaller. She has no new lesions.  Objective: Temp (24hrs), Avg:98.9 F (37.2 C), Min:98.8 F (37.1 C), Max:99.1 F (37.3 C)    General: She is comfortable and in no distress. She is visiting with friends and family. Skin: The boil on her left mid back is much improved. It is about half the size it was last week and now has only minimal residual induration. I cannot express any drainage. Her left groin lesion is also much smaller and currently not draining.   Lab Results Lab Results  Component Value Date   WBC 7.9 05/29/2011   HGB 8.2* 05/29/2011   HCT 26.1* 05/29/2011   MCV 76.5* 05/29/2011   PLT 269 05/29/2011    Lab Results  Component Value Date   CREATININE 0.65 05/29/2011   BUN 7 05/29/2011   NA 136 05/29/2011   K 4.1 05/29/2011   CL 105 05/29/2011   CO2 22 05/29/2011    Lab Results  Component Value Date   ALT 71* 05/29/2011   AST 66* 05/29/2011   ALKPHOS 79 05/29/2011   BILITOT 0.1* 05/29/2011     Microbiology: Recent Results (from the past 240 hour(s))  MRSA PCR SCREENING     Status: Normal   Collection Time   05/25/11  9:05 PM      Component Value Range Status Comment   MRSA by PCR NEGATIVE  NEGATIVE  Final   MRSA PCR SCREENING     Status: Abnormal   Collection Time   05/26/11  9:50 AM      Component Value Range Status  Comment   MRSA by PCR POSITIVE (*) NEGATIVE  Final   WOUND CULTURE     Status: Normal   Collection Time   05/26/11 11:40 AM      Component Value Range Status Comment   Specimen Description LEG LEFT   Final    Special Requests Normal   Final    Gram Stain     Final    Value: NO WBC SEEN     NO SQUAMOUS EPITHELIAL CELLS SEEN     RARE GRAM POSITIVE RODS     RARE GRAM POSITIVE COCCI IN PAIRS   Culture     Final    Value: FEW DIPHTHEROIDS(CORYNEBACTERIUM SPECIES)     Note: Standardized susceptibility testing for this organism is not available.   Report Status 05/29/2011 FINAL   Final   WOUND CULTURE     Status: Normal (Preliminary result)   Collection Time   05/26/11 11:40 AM      Component Value Range Status Comment   Specimen Description BACK LEFT   Final    Special Requests Normal   Final    Gram Stain     Final  Value: RARE WBC PRESENT, PREDOMINANTLY PMN     MODERATE SQUAMOUS EPITHELIAL CELLS PRESENT     MODERATE GRAM NEGATIVE RODS     FEW GRAM POSITIVE RODS     FEW GRAM POSITIVE COCCI IN PAIRS   Culture     Final    Value: FEW DIPHTHEROIDS(CORYNEBACTERIUM SPECIES)     Note: Standardized susceptibility testing for this organism is not available.   Report Status PENDING   Incomplete     Studies/Results: US Fetal Bpp W/o Non Stress  05/29/2011  OBSTETRICAL ULTRASOUND: This exam was performed within a [facility]. The OB US report was generated in the [facility] system, and faxed to the ordering physician.   This report is also available in [employer] and in the [employer]. See [facility] Obstetric US report.   US Fetal Bpp W/o Non Stress  05/28/2011  OBSTETRICAL ULTRASOUND: This exam was performed within a [facility]. The OB US report was generated in the [facility] system, and faxed to the ordering physician.   This report is also available in [employer] and in the [employer]. See [facility] Obstetric US report.      Assessment: Her recurrent boils are improving. Gram stain and culture results suggest that these may be polymicrobial skin flora. MRSA has not been isolated so I will discontinue vancomycin now. I would continue Augmentin about another 2-3 days.  Plan: 1. Continue Augmentin another 2-3 days. 2. Discontinue Vancomycin. 3. I will sign off now. Please call if we can assist further.   Cliffton Asters, MD [facility] [phone] pager   [phone] cell 05/29/2011, 1:53 PM        I

## 2011-05-29 NOTE — Progress Notes (Signed)
  Nutrition Dx: Food and nutrition-related knowledge deficit r/t no previous education aeb newly diagnosed GDM.   Nutrition education consult for Carbohydrate Modified Gestational Diabetic Diet completed.  "Meal  plan for gestational diabetics" handout given to patient.  Basic concepts reviewed.  Questions answered.  Patient verbalizes understanding.     

## 2011-05-29 NOTE — Progress Notes (Signed)
BPP completed today.  Results are 8/8.  Mild polyhydramnios persists.  Recommendations given regarding BP management and lab abnormalities.  See report in [facility] for full note.

## 2011-05-29 NOTE — Progress Notes (Signed)
UR chart review completed.  

## 2011-05-30 ENCOUNTER — Inpatient Hospital Stay (HOSPITAL_COMMUNITY): Payer: Medicaid Other

## 2011-05-30 ENCOUNTER — Encounter (HOSPITAL_COMMUNITY): Payer: Self-pay | Admitting: Anesthesiology

## 2011-05-30 LAB — COMPREHENSIVE METABOLIC PANEL
Alkaline Phosphatase: 76 U/L (ref 39–117)
BUN: 9 mg/dL (ref 6–23)
BUN: 7 mg/dL (ref 6–23)
CO2: 21 mEq/L (ref 19–32)
Calcium: 9.3 mg/dL (ref 8.4–10.5)
Chloride: 105 mEq/L (ref 96–112)
Creatinine, Ser: 0.76 mg/dL (ref 0.50–1.10)
Creatinine, Ser: 0.67 mg/dL (ref 0.50–1.10)
GFR calc Af Amer: >90 mL/min (ref >90)
GFR calc non Af Amer: >90 mL/min (ref >90)
Glucose, Bld: 88 mg/dL (ref 70–99)
Potassium: 3.8 mEq/L (ref 3.5–5.1)
Total Bilirubin: 0.1 mg/dL — ABNORMAL LOW (ref 0.3–1.2)
Total Protein: 6.7 g/dL (ref 6.0–8.3)

## 2011-05-30 LAB — CBC
HCT: 27.2 % — ABNORMAL LOW (ref 36.0–46.0)
MCH: 23.9 pg — ABNORMAL LOW (ref 26.0–34.0)
MCHC: 31.3 g/dL (ref 30.0–36.0)
MCV: 76.4 fL — ABNORMAL LOW (ref 78.0–100.0)
RDW: 17.7 % — ABNORMAL HIGH (ref 11.5–15.5)

## 2011-05-30 LAB — GLUCOSE, CAPILLARY: Glucose-Capillary: 78 mg/dL (ref 70–99)

## 2011-05-30 LAB — WOUND CULTURE

## 2011-05-30 LAB — URIC ACID: Uric Acid, Serum: 5.2 mg/dL (ref 2.4–7.0)

## 2011-05-30 MED ORDER — LABETALOL HCL 100 MG PO TABS
100.0000 mg | ORAL_TABLET | Freq: Two times a day (BID) | ORAL | Status: DC
  Administered 2011-05-30 – 2011-06-01 (×4): 100 mg via ORAL
  Filled 2011-05-30 (×6): qty 1

## 2011-05-30 NOTE — Anesthesia Preprocedure Evaluation (Deleted)
Anesthesia Evaluation Anesthesia Physical Anesthesia Plan Anesthesia Quick Evaluation  

## 2011-05-30 NOTE — Progress Notes (Signed)
Krystal Hartman is a 20 y.o. G3P1011 at [redacted]w[redacted]d by LMP admitted for polyhydramnios, non reassuring antepartum testing.  Pt also has history of elevated liver enzymes, and has had a prior c-section  Subjective: GI: Denies nausea, vomiting or diarrhea. GU: Denies: dysuria, frequency/urgency, hematuria, genital discharge OB: adequate fetal movement        Objective: BP 160/99  Pulse 80  Temp(Src) 97.9 F (36.6 C) (Oral)  Resp 18  Ht 5\' 4"  (1.626 m)  Wt 393 lb 11.2 oz (178.581 kg)  BMI 67.58 kg/m2  SpO2 98%      FHT:  FHR: 140s bpm, variability: moderate,  accelerations:  Present,  decelerations:  Absent UC:   none SVE:   Dilation: Closed Exam by:: Sanda Klein, CNM  Labs: Lab Results  Component Value Date   WBC 5.9 05/30/2011   HGB 8.5* 05/30/2011   HCT 27.2* 05/30/2011   MCV 76.4* 05/30/2011   PLT 228 05/30/2011  LFTs remain in the 70 range  Assessment / Plan: polyhydramnios Hypertension, chronic ? Superimposed pre-eclampsia with minimally elevated liver enzymes.  This is likewise a chronic finding which was present at least 1 year ago.  Level of proteinuria is less than diagnostic for pre-eclampsia Prior c/s with initial desire for TOLAC. Predictor tool puts patient's likelihood of success at 17%. Repeat CS is advised and accepted after discussion of risks and benefits of repeat CS and TOLAC. Morbid obesity MRSA cellulitis BPP today 6/8 with a reactive NST Fetal Wellbeing:  Category II  Plan: Repeat LFTs in am ANA and Hepatitis C testing MFM consultation re a) use of antihypertensive                                   B)criteria for delivery                                   C) criteria for possible discharge home Discussed possible need for CS delivery if BP control is inadequate   Krystal Hartman P 05/30/2011, 5:12 PM

## 2011-05-30 NOTE — Progress Notes (Signed)
MFM Note  Krystal Hartman is a 20 year old [id number] AA female at 33+6 weeks who was admitted on 12/06 from the office for a BPP of 2/8. Mild polyhydramnios was also present. No structural abnormalities were visualized to explain the increased AFV.  She has had daily BPPs which have all been reassuring. Fetal heart rate tracing is reactive.   [name] was diagnosed with hypertension about 3 years ago and she has taken HCTZ since then. During this pregnancy, her HCTZ was discontinued and her pressures have remained normal until this admission. Her BPs have varied widely with the last few pressures being 140s-160s/70s-90s. They have been taken using her upper arm and forearm.   Chart review of her prior pregnancy revealed that she remained on HCTZ and her pressures began to rise in the third trimester. At 38+5 weeks, LFTs were obtained and found to be slightly elevated. She was induced 2 days later with the diagnosis of PIH. Magnesium sulfate was given during the induction. A C/S was performed for failure to progress and the fetus was found to be OP with nuchal cord x 3. On POD # 1, her LFTs remained slightly elevated but by POD # 2 they were within normal limits.   Labs during this admission have also shown slightly elevated LFTs with normal platelets and LDH. Serial LFTs have been stable at ~ 2 x normal. A 24 hour urine for total protein was within normal range. (Per prenatal records, PIH labs were drawn at 17 weeks with no abnormalities except for an elevated glucose).  Currently, the patient is alert and talkative. She denies any headache, change in vision, abdominal pain or shortness of breath. She reports good fetal movement.  Assessment: 1) IUP at 33+6 weeks with reassuring antenatal testing 2) Chronic hypertension with labile BPs on no medication and no significant proteinuria 3) Slightly elevated LFTs without any other findings of HELLP syndrome 4) Mild polyhydramnios discovered in the third  trimester - most likely idiopathic polyhydramnios (OGTTs normal) 5) Morbid obesity 6) Prior LTCS 7) Multiple infected skin lesions; negative for MRSA; on oral antibiotics per Dr. Orvan Falconer  Recommendations: 1) Would start Labetalol 100 mgs every 12 hours; observe BPs for 1-2 days and consider discharge if all parameters remain stable or reassuring (LFTs, fetal status, etc.) or continue to observe without medication if BPs are consistently < 140/90 2) Treat persistent BPs of 160s/110s with IV meds and proceed with delivery 3) Deliver for any S/S of chronic hypertension with superimposed severe preeclampsia (BPs 160s/110s, severe HA or other CNS findings, shortness of breath, upper abdominal pain, thrombocytopenia or abruption); would allow to labor and deliver with SROM 4) As outpatient, continue twice weekly NSTs with BP checks and weekly AFIs; weekly labs 5) Would deliver with any S/S of preeclampsia; otherwise, repeat C/S at 39 weeks 6) See note by Dr. Rachel Bo on [place] report from 12/10  I spent 20 minutes with [name] today of which 50% was face-to-face counseling.

## 2011-05-30 NOTE — Progress Notes (Signed)
33 6/7 weeks--Hospital day #5  S:  Doing well.  Denies HA, visual symptoms, epigastric pain.  Report lesion on back is much improved.  Hoping for discharge soon.  Had court date yesterday, and has another scheduled tomorrow.  Has spoken with SW, may need notes to excuse absences upon discharge.  OCeasar Mons Vitals:   05/29/11 1939 05/30/11 0735 05/30/11 1138 05/30/11 1259  BP: 159/76 162/79 164/100 130/80  Pulse: 78 84 86 66  Temp: 98 F (36.7 C) 97.9 F (36.6 C)    TempSrc: Oral     Resp: 20 20 18 18   Height:      Weight:      SpO2:       BP is variable, depending on the site at which it's taken--forearm generally most stable, elevated when taken in upper arm.   Chest clear Heart RRR without murmur Abd gravid, NT Ext DTR 1-2+, without clonus, 1+edema Lesion on back--not draining, small amount induration.  FHR reactive, no decels. Occasional mild contractions.  Results for orders placed during the hospital encounter of 05/25/11 (from the past 24 hour(s))  GLUCOSE, CAPILLARY     Status: Normal   Collection Time   05/29/11  2:54 PM      Component Value Range   Glucose-Capillary 78  70 - 99 (mg/dL)  GLUCOSE, CAPILLARY     Status: Normal   Collection Time   05/29/11  5:27 PM      Component Value Range   Glucose-Capillary 81  70 - 99 (mg/dL)   Comment 1 Documented in Chart     Comment 2 Notify RN    GLUCOSE, CAPILLARY     Status: Normal   Collection Time   05/29/11  7:00 PM      Component Value Range   Glucose-Capillary 71  70 - 99 (mg/dL)   Comment 1 Documented in Chart     Comment 2 Notify RN    GLUCOSE, CAPILLARY     Status: Normal   Collection Time   05/29/11  9:13 PM      Component Value Range   Glucose-Capillary 98  70 - 99 (mg/dL)   Comment 1 Documented in Chart     Comment 2 Notify RN    CBC     Status: Abnormal   Collection Time   05/30/11  5:05 AM      Component Value Range   WBC 5.9  4.0 - 10.5 (K/uL)   RBC 3.56 (*) 3.87 - 5.11 (MIL/uL)   Hemoglobin  8.5 (*) 12.0 - 15.0 (g/dL)   HCT 57.8 (*) 46.9 - 46.0 (%)   MCV 76.4 (*) 78.0 - 100.0 (fL)   MCH 23.9 (*) 26.0 - 34.0 (pg)   MCHC 31.3  30.0 - 36.0 (g/dL)   RDW 62.9 (*) 52.8 - 15.5 (%)   Platelets 228  150 - 400 (K/uL)  LACTATE DEHYDROGENASE     Status: Normal   Collection Time   05/30/11  5:05 AM      Component Value Range   LD 125  94 - 250 (U/L)  COMPREHENSIVE METABOLIC PANEL     Status: Abnormal   Collection Time   05/30/11  5:05 AM      Component Value Range   Sodium 136  135 - 145 (mEq/L)   Potassium 3.8  3.5 - 5.1 (mEq/L)   Chloride 105  96 - 112 (mEq/L)   CO2 21  19 - 32 (mEq/L)   Glucose, Bld  88  70 - 99 (mg/dL)   BUN 9  6 - 23 (mg/dL)   Creatinine, Ser 1.61  0.50 - 1.10 (mg/dL)   Calcium 9.3  8.4 - 09.6 (mg/dL)   Total Protein 6.7  6.0 - 8.3 (g/dL)   Albumin 2.1 (*) 3.5 - 5.2 (g/dL)   AST 78 (*) 0 - 37 (U/L)   ALT 78 (*) 0 - 35 (U/L)   Alkaline Phosphatase 76  39 - 117 (U/L)   Total Bilirubin 0.2 (*) 0.3 - 1.2 (mg/dL)   GFR calc non Af Amer >90  >90 (mL/min)   GFR calc Af Amer >90  >90 (mL/min)  URIC ACID     Status: Normal   Collection Time   05/30/11  5:05 AM      Component Value Range   Uric Acid, Serum 5.2  2.4 - 7.0 (mg/dL)  GLUCOSE, CAPILLARY     Status: Normal   Collection Time   05/30/11  6:08 AM      Component Value Range   Glucose-Capillary 78  70 - 99 (mg/dL)   Comment 1 Documented in Chart    GLUCOSE, CAPILLARY     Status: Abnormal   Collection Time   05/30/11  9:27 AM      Component Value Range   Glucose-Capillary 65 (*) 70 - 99 (mg/dL)  GLUCOSE, CAPILLARY     Status: Normal   Collection Time   05/30/11 11:39 AM      Component Value Range   Glucose-Capillary 72  70 - 99 (mg/dL)   Comment 1 Documented in Chart     Comment 2 Notify RN    GLUCOSE, CAPILLARY     Status: Abnormal   Collection Time   05/30/11  1:35 PM      Component Value Range   Glucose-Capillary 64 (*) 70 - 99 (mg/dL)   CBGs stable AST/ALT slightly elevated (previously  66 and 71 on 05/29/11).  A:  IUP at 33 6/7 weeks      Chronic Hypertension--no current meds.      Mild elevation of LFTs--unclear etiology       Polyhydramnios with normal 3 hr GTTX2, elevated CBG during this admission, now stable.      Skin lesion, polymicrobial cultures, hx previous MRSA--resolving after Vancomycin course, now on Augmentin po      Previous cesarean section with a calculated VBAC success chance of 17 %       Morbid obesity with BMI 65.2  P:  Per Dr. Pennie Rushing, plan MFM consult for plan of care regarding BP and elevated LFTs.  Nigel Bridgeman, CNM, [place] 05/30/11 2:50pm

## 2011-05-31 ENCOUNTER — Encounter (HOSPITAL_COMMUNITY)
Admission: AD | Disposition: A | Discharge status: Home / Self Care | Payer: Self-pay | Source: Ambulatory Visit | Attending: Obstetrics and Gynecology

## 2011-05-31 LAB — COMPREHENSIVE METABOLIC PANEL
ALT: 97 U/L — ABNORMAL HIGH (ref 0–35)
AST: 93 U/L — ABNORMAL HIGH (ref 0–37)
Alkaline Phosphatase: 84 U/L (ref 39–117)
CO2: 21 mEq/L (ref 19–32)
Chloride: 103 mEq/L (ref 96–112)
GFR calc non Af Amer: >90 mL/min (ref >90)
Sodium: 134 mEq/L — ABNORMAL LOW (ref 135–145)
Total Bilirubin: 0.1 mg/dL — ABNORMAL LOW (ref 0.3–1.2)

## 2011-05-31 LAB — CBC
Hemoglobin: 8.3 g/dL — ABNORMAL LOW (ref 12.0–15.0)
MCH: 24.3 pg — ABNORMAL LOW (ref 26.0–34.0)
RBC: 3.41 MIL/uL — ABNORMAL LOW (ref 3.87–5.11)

## 2011-05-31 LAB — GLUCOSE, CAPILLARY: Glucose-Capillary: 80 mg/dL (ref 70–99)

## 2011-05-31 LAB — SAMPLE TO BLOOD BANK

## 2011-05-31 LAB — LACTATE DEHYDROGENASE: LDH: 123 U/L (ref 94–250)

## 2011-05-31 NOTE — Progress Notes (Signed)
20 y.o. year old female,at [redacted]w[redacted]d gestation.  SUBJECTIVE:  The patient denies headaches, blurred vision, and right upper quadrant tenderness. The patient relates that with her last pregnancy she had the same elevation in her liver enzymes and blood pressure. She was observed as an outpatient. She had weekly antenatal testing. She was delivered at term.  OBJECTIVE:  BP 149/72  Pulse 68  Temp(Src) 98.3 F (36.8 C) (Oral)  Resp 18  Ht 5\' 4"  (1.626 m)  Wt 178.717 kg (394 lb)  BMI 67.63 kg/m2  SpO2 98%  Fetal Heart Tones:  Category 1  Contractions:          None  Chest: Clear  Heart: Regular rate and rhythm  Abdomen: Gravid and nontender  Extremities: Massively obese. No clonus.  Reflexes: Normal  CMP     Component Value Date/Time   NA 134* 05/31/2011 0550   K 3.5 05/31/2011 0550   CL 103 05/31/2011 0550   CO2 21 05/31/2011 0550   GLUCOSE 108* 05/31/2011 0550   BUN 9 05/31/2011 0550   CREATININE 0.64 05/31/2011 0550   CREATININE 0.54 11/08/2009 1123   CALCIUM 8.9 05/31/2011 0550   PROT 5.9* 05/31/2011 0550   ALBUMIN 1.9* 05/31/2011 0550   AST 93* 05/31/2011 0550   ALT 97* 05/31/2011 0550   ALKPHOS 84 05/31/2011 0550   BILITOT 0.1* 05/31/2011 0550   GFRNONAA >90 05/31/2011 0550   GFRAA >90 05/31/2011 0550   CBC    Component Value Date/Time   WBC 5.3 05/31/2011 0550   RBC 3.41* 05/31/2011 0550   HGB 8.3* 05/31/2011 0550   HCT 26.2* 05/31/2011 0550   PLT 229 05/31/2011 0550   MCV 76.8* 05/31/2011 0550   MCH 24.3* 05/31/2011 0550   MCHC 31.7 05/31/2011 0550   RDW 17.9* 05/31/2011 0550   LYMPHSABS 2.1 05/27/2011 0514   MONOABS 0.7 05/27/2011 0514   EOSABS 0.1 05/27/2011 0514   BASOSABS 0.0 05/27/2011 0514    ASSESSMENT:  [redacted]w[redacted]d Weeks Pregnancy  Chronic hypertension  Elevated liver enzymes of uncertain etiology. Values are currently stable.  I do not think the patient has preeclampsia.  Obesity  PLAN:  We will continue to manage the patient as an  inpatient. We will repeat her liver enzymes tomorrow. The patient will continue on her antihypertensives. The patient may be ready for discharge tomorrow. I do not think that cesarean delivery today is appropriate.  Leonard Schwartz, M.D.

## 2011-06-01 LAB — CBC
HCT: 27 % — ABNORMAL LOW (ref 36.0–46.0)
Hemoglobin: 8.4 g/dL — ABNORMAL LOW (ref 12.0–15.0)
MCH: 23.9 pg — ABNORMAL LOW (ref 26.0–34.0)
MCV: 76.9 fL — ABNORMAL LOW (ref 78.0–100.0)
RBC: 3.51 MIL/uL — ABNORMAL LOW (ref 3.87–5.11)

## 2011-06-01 LAB — COMPREHENSIVE METABOLIC PANEL
AST: 80 U/L — ABNORMAL HIGH (ref 0–37)
CO2: 21 mEq/L (ref 19–32)
Chloride: 105 mEq/L (ref 96–112)
Creatinine, Ser: 0.6 mg/dL (ref 0.50–1.10)
GFR calc non Af Amer: >90 mL/min (ref >90)
Total Bilirubin: 0.1 mg/dL — ABNORMAL LOW (ref 0.3–1.2)

## 2011-06-01 LAB — GLUCOSE, CAPILLARY: Glucose-Capillary: 99 mg/dL (ref 70–99)

## 2011-06-01 LAB — LACTATE DEHYDROGENASE: LDH: 122 U/L (ref 94–250)

## 2011-06-01 MED ORDER — LABETALOL HCL 100 MG PO TABS: 100.0000 mg | ORAL_TABLET | Freq: Two times a day (BID) | ORAL | Status: DC

## 2011-06-01 NOTE — Discharge Summary (Signed)
Physician Discharge Summary  Patient ID: Krystal Hartman MRN: [id number] DOB/AGE: [birth year] 20 y.o.  Admit date: 05/25/2011 Discharge date: 06/01/2011  Admission Diagnoses: 33 week IUP, BPP 2/8, CHTN, abcess  Discharge Diagnoses:  Active Problems:  Morbid obesity  History of MRSA infection  Recurrent boils  HSV-2 (herpes simplex virus 2) infection  Polyhydramnios in third trimester  Anemia reassuring fetal testing CHTN Elevated LFTS HX of Cesarean  Discharged Condition: stable  Hospital Course: admitted on 05/25/11 with BPP 2/8. Repeat BPP following day 8/8. Infectious disease consult placed on vancomycin then augmentin po. MRSA culture from abcess negative abcess resolved. Labetalol 100 mg po bid started on 05/31/11 with good response of BP. LTFS slightly elevated during hospital course. Fetal status remained reassuring and BPPs remained normal. MFM consult obtained for plan of care with pt deemed stable for discharge on 06/01/11. Elevated blood glucose at times, with normal prior glucose testing. These resolved during hospitalization. Plan: BPP and NST weekly for biweekly testing, f/o Monday for BPP, 24 urine, PIH labs. 24 hour urine and PIH labs to be repeated weekly.   Consults: ID and MFM  Significant Diagnostic Studies: Ultrasound, labs  Treatments: IV and PO antibiotics, antihypertensives  Discharge Exam: Blood pressure 143/63, pulse 68, temperature 97.9 F (36.6 C), temperature source Oral, resp. rate 22, height 5\' 4"  (1.626 m), weight 177.855 kg (392 lb 1.6 oz), SpO2 98.00%. Chest clear, heart RRR, abd soft, gravid, nt, abcess L back resolved, extremities with +1 edema lower legs, negative Homan's sign bilaterally  Results for orders placed during the hospital encounter of 05/25/11 (from the past 48 hour(s))  GLUCOSE, CAPILLARY     Status: Abnormal   Collection Time   05/30/11  3:37 PM      Component Value Range Comment   Glucose-Capillary 100 (*) 70 - 99 (mg/dL)     Comment 1 Documented in Chart      Comment 2 Notify RN     ANA     Status: Normal   Collection Time   05/30/11  6:53 PM      Component Value Range Comment   ANA NEGATIVE  NEGATIVE    HEPATITIS C ANTIBODY     Status: Normal   Collection Time   05/30/11  6:53 PM      Component Value Range Comment   HCV Ab NEGATIVE  NEGATIVE    COMPREHENSIVE METABOLIC PANEL     Status: Abnormal   Collection Time   05/30/11  6:53 PM      Component Value Range Comment   Sodium 137  135 - 145 (mEq/L)    Potassium 3.5  3.5 - 5.1 (mEq/L)    Chloride 105  96 - 112 (mEq/L)    CO2 21  19 - 32 (mEq/L)    Glucose, Bld 87  70 - 99 (mg/dL)    BUN 7  6 - 23 (mg/dL)    Creatinine, Ser 7.56  0.50 - 1.10 (mg/dL)    Calcium 9.4  8.4 - 10.5 (mg/dL)    Total Protein 7.0  6.0 - 8.3 (g/dL)    Albumin 2.2 (*) 3.5 - 5.2 (g/dL)    AST 433 (*) 0 - 37 (U/L)    ALT 101 (*) 0 - 35 (U/L)    Alkaline Phosphatase 85  39 - 117 (U/L)    Total Bilirubin 0.1 (*) 0.3 - 1.2 (mg/dL)    GFR calc non Af Amer >90  >90 (mL/min)    GFR calc Af  Amer >90  >90 (mL/min)   GLUCOSE, CAPILLARY     Status: Abnormal   Collection Time   05/30/11 10:14 PM      Component Value Range Comment   Glucose-Capillary 114 (*) 70 - 99 (mg/dL)   CBC     Status: Abnormal   Collection Time   05/31/11  5:50 AM      Component Value Range Comment   WBC 5.3  4.0 - 10.5 (K/uL)    RBC 3.41 (*) 3.87 - 5.11 (MIL/uL)    Hemoglobin 8.3 (*) 12.0 - 15.0 (g/dL)    HCT 16.1 (*) 09.6 - 46.0 (%)    MCV 76.8 (*) 78.0 - 100.0 (fL)    MCH 24.3 (*) 26.0 - 34.0 (pg)    MCHC 31.7  30.0 - 36.0 (g/dL)    RDW 04.5 (*) 40.9 - 15.5 (%)    Platelets 229  150 - 400 (K/uL)   LACTATE DEHYDROGENASE     Status: Normal   Collection Time   05/31/11  5:50 AM      Component Value Range Comment   LD 123  94 - 250 (U/L)   COMPREHENSIVE METABOLIC PANEL     Status: Abnormal   Collection Time   05/31/11  5:50 AM      Component Value Range Comment   Sodium 134 (*) 135 - 145 (mEq/L)     Potassium 3.5  3.5 - 5.1 (mEq/L)    Chloride 103  96 - 112 (mEq/L)    CO2 21  19 - 32 (mEq/L)    Glucose, Bld 108 (*) 70 - 99 (mg/dL)    BUN 9  6 - 23 (mg/dL)    Creatinine, Ser 8.11  0.50 - 1.10 (mg/dL)    Calcium 8.9  8.4 - 10.5 (mg/dL)    Total Protein 5.9 (*) 6.0 - 8.3 (g/dL)    Albumin 1.9 (*) 3.5 - 5.2 (g/dL)    AST 93 (*) 0 - 37 (U/L)    ALT 97 (*) 0 - 35 (U/L)    Alkaline Phosphatase 84  39 - 117 (U/L)    Total Bilirubin 0.1 (*) 0.3 - 1.2 (mg/dL)    GFR calc non Af Amer >90  >90 (mL/min)    GFR calc Af Amer >90  >90 (mL/min)   URIC ACID     Status: Normal   Collection Time   05/31/11  5:50 AM      Component Value Range Comment   Uric Acid, Serum 5.4  2.4 - 7.0 (mg/dL)   SAMPLE TO BLOOD BANK     Status: Normal   Collection Time   05/31/11  5:50 AM      Component Value Range Comment   Blood Bank Specimen SAMPLE AVAILABLE FOR TESTING      Sample Expiration 06/03/2011     GLUCOSE, CAPILLARY     Status: Normal   Collection Time   05/31/11  7:17 AM      Component Value Range Comment   Glucose-Capillary 85  70 - 99 (mg/dL)   GLUCOSE, CAPILLARY     Status: Normal   Collection Time   05/31/11  3:20 PM      Component Value Range Comment   Glucose-Capillary 86  70 - 99 (mg/dL)    Comment 1 Notify RN      Comment 2 Documented in Chart     GLUCOSE, CAPILLARY     Status: Normal   Collection Time   05/31/11  9:00 PM  Component Value Range Comment   Glucose-Capillary 80  70 - 99 (mg/dL)    Comment 1 Notify RN      Comment 2 Documented in Chart     CBC     Status: Abnormal   Collection Time   06/01/11  5:30 AM      Component Value Range Comment   WBC 5.7  4.0 - 10.5 (K/uL)    RBC 3.51 (*) 3.87 - 5.11 (MIL/uL)    Hemoglobin 8.4 (*) 12.0 - 15.0 (g/dL)    HCT 66.4 (*) 40.3 - 46.0 (%)    MCV 76.9 (*) 78.0 - 100.0 (fL)    MCH 23.9 (*) 26.0 - 34.0 (pg)    MCHC 31.1  30.0 - 36.0 (g/dL)    RDW 47.4 (*) 25.9 - 15.5 (%)    Platelets 250  150 - 400 (K/uL)   LACTATE  DEHYDROGENASE     Status: Normal   Collection Time   06/01/11  5:30 AM      Component Value Range Comment   LD 122  94 - 250 (U/L)   COMPREHENSIVE METABOLIC PANEL     Status: Abnormal   Collection Time   06/01/11  5:30 AM      Component Value Range Comment   Sodium 136  135 - 145 (mEq/L)    Potassium 3.6  3.5 - 5.1 (mEq/L)    Chloride 105  96 - 112 (mEq/L)    CO2 21  19 - 32 (mEq/L)    Glucose, Bld 85  70 - 99 (mg/dL)    BUN 9  6 - 23 (mg/dL)    Creatinine, Ser 5.63  0.50 - 1.10 (mg/dL)    Calcium 8.9  8.4 - 10.5 (mg/dL)    Total Protein 6.8  6.0 - 8.3 (g/dL)    Albumin 2.1 (*) 3.5 - 5.2 (g/dL)    AST 80 (*) 0 - 37 (U/L)    ALT 96 (*) 0 - 35 (U/L)    Alkaline Phosphatase 76  39 - 117 (U/L)    Total Bilirubin 0.1 (*) 0.3 - 1.2 (mg/dL)    GFR calc non Af Amer >90  >90 (mL/min)    GFR calc Af Amer >90  >90 (mL/min)   URIC ACID     Status: Normal   Collection Time   06/01/11  5:30 AM      Component Value Range Comment   Uric Acid, Serum 5.3  2.4 - 7.0 (mg/dL)   GLUCOSE, CAPILLARY     Status: Normal   Collection Time   06/01/11  6:31 AM      Component Value Range Comment   Glucose-Capillary 77  70 - 99 (mg/dL)   GLUCOSE, CAPILLARY     Status: Normal   Collection Time   06/01/11 10:34 AM      Component Value Range Comment   Glucose-Capillary 99  70 - 99 (mg/dL)    Comment 1 Notify RN      Comment 2 Documented in Chart       Disposition: Home or Self Care  Discharge Orders    Future Orders Please Complete By Expires   Rubella antibody, IgM      Comments:   This external order was created through the Results Console.   Hepatitis B surface antigen      Comments:   This external order was created through the Results Console.   RPR      Comments:   This external order was  created through the Results Console.   HIV antibody      Comments:   This external order was created through the Results Console.     Current Discharge Medication List    START taking these  medications   Details  labetalol (NORMODYNE) 100 MG tablet Take 1 tablet (100 mg total) by mouth 2 (two) times daily. Qty: 60 tablet, Refills: 2      CONTINUE these medications which have NOT CHANGED   Details  prenatal vitamin w/FE, FA (PRENATAL 1 + 1) 27-1 MG TABS Take 1 tablet by mouth daily.      terconazole (TERAZOL 7) 0.4 % vaginal cream Place 1 applicator vaginally at bedtime.         Follow-up Information    Follow up with [facility] on 06/05/2011. (start 24 hour urine on 12/16 bring to office 12/17)        Collaboration with Dr. Normand Sloop at [facility].  Signed: Nigel Bridgeman 06/01/2011, 1:51 PM

## 2011-06-08 ENCOUNTER — Other Ambulatory Visit: Payer: Self-pay | Admitting: Obstetrics and Gynecology

## 2011-06-10 ENCOUNTER — Inpatient Hospital Stay (HOSPITAL_COMMUNITY): Payer: Medicaid Other

## 2011-06-10 ENCOUNTER — Encounter (HOSPITAL_COMMUNITY): Payer: Self-pay | Admitting: *Deleted

## 2011-06-10 ENCOUNTER — Inpatient Hospital Stay (HOSPITAL_COMMUNITY)
Admission: AD | Admit: 2011-06-10 | Discharge: 2011-06-25 | DRG: 765 | Disposition: A | Discharge status: Home / Self Care | Payer: Medicaid Other | Source: Ambulatory Visit | Attending: Obstetrics and Gynecology | Admitting: Obstetrics and Gynecology

## 2011-06-10 DIAGNOSIS — D649 Anemia, unspecified: Secondary | ICD-10-CM | POA: Diagnosis present

## 2011-06-10 DIAGNOSIS — O409XX0 Polyhydramnios, unspecified trimester, not applicable or unspecified: Secondary | ICD-10-CM | POA: Diagnosis present

## 2011-06-10 DIAGNOSIS — O9902 Anemia complicating childbirth: Secondary | ICD-10-CM | POA: Diagnosis present

## 2011-06-10 DIAGNOSIS — I10 Essential (primary) hypertension: Secondary | ICD-10-CM | POA: Diagnosis present

## 2011-06-10 DIAGNOSIS — IMO0002 Reserved for concepts with insufficient information to code with codable children: Principal | ICD-10-CM | POA: Diagnosis present

## 2011-06-10 DIAGNOSIS — O1092 Unspecified pre-existing hypertension complicating childbirth: Secondary | ICD-10-CM

## 2011-06-10 DIAGNOSIS — E669 Obesity, unspecified: Secondary | ICD-10-CM | POA: Diagnosis present

## 2011-06-10 DIAGNOSIS — O403XX0 Polyhydramnios, third trimester, not applicable or unspecified: Secondary | ICD-10-CM

## 2011-06-10 DIAGNOSIS — O909 Complication of the puerperium, unspecified: Secondary | ICD-10-CM

## 2011-06-10 DIAGNOSIS — L02219 Cutaneous abscess of trunk, unspecified: Secondary | ICD-10-CM | POA: Diagnosis present

## 2011-06-10 DIAGNOSIS — A4902 Methicillin resistant Staphylococcus aureus infection, unspecified site: Secondary | ICD-10-CM | POA: Diagnosis present

## 2011-06-10 DIAGNOSIS — O34219 Maternal care for unspecified type scar from previous cesarean delivery: Secondary | ICD-10-CM | POA: Diagnosis present

## 2011-06-10 DIAGNOSIS — L03319 Cellulitis of trunk, unspecified: Secondary | ICD-10-CM | POA: Diagnosis present

## 2011-06-10 HISTORY — DX: Unspecified pre-existing hypertension complicating childbirth: O10.92

## 2011-06-10 LAB — COMPREHENSIVE METABOLIC PANEL
ALT: 44 U/L — ABNORMAL HIGH (ref 0–35)
AST: 42 U/L — ABNORMAL HIGH (ref 0–37)
Albumin: 2.4 g/dL — ABNORMAL LOW (ref 3.5–5.2)
Calcium: 8.8 mg/dL (ref 8.4–10.5)
Creatinine, Ser: 0.63 mg/dL (ref 0.50–1.10)
Sodium: 138 mEq/L (ref 135–145)
Total Protein: 7.1 g/dL (ref 6.0–8.3)

## 2011-06-10 LAB — URINALYSIS, ROUTINE W REFLEX MICROSCOPIC
Glucose, UA: NEGATIVE mg/dL
Ketones, ur: NEGATIVE mg/dL
Nitrite: NEGATIVE
Protein, ur: NEGATIVE mg/dL
pH: 6.5 (ref 5.0–8.0)

## 2011-06-10 LAB — CBC
MCH: 24.5 pg — ABNORMAL LOW (ref 26.0–34.0)
MCV: 76.6 fL — ABNORMAL LOW (ref 78.0–100.0)
Platelets: 288 10*3/uL (ref 150–400)
RBC: 3.68 MIL/uL — ABNORMAL LOW (ref 3.87–5.11)
RDW: 18.8 % — ABNORMAL HIGH (ref 11.5–15.5)

## 2011-06-10 LAB — URINE MICROSCOPIC-ADD ON

## 2011-06-10 LAB — LACTATE DEHYDROGENASE: LDH: 136 U/L (ref 94–250)

## 2011-06-10 MED ORDER — DOCUSATE SODIUM 100 MG PO CAPS
100.0000 mg | ORAL_CAPSULE | Freq: Every day | ORAL | Status: DC
  Administered 2011-06-10 – 2011-06-21 (×12): 100 mg via ORAL
  Filled 2011-06-10 (×12): qty 1

## 2011-06-10 MED ORDER — PRENATAL MULTIVITAMIN CH
1.0000 | ORAL_TABLET | Freq: Every day | ORAL | Status: DC
  Administered 2011-06-10 – 2011-06-21 (×12): 1 via ORAL
  Filled 2011-06-10 (×12): qty 1

## 2011-06-10 MED ORDER — CALCIUM CARBONATE ANTACID 500 MG PO CHEW: 2.0000 | CHEWABLE_TABLET | ORAL | Status: DC | PRN

## 2011-06-10 MED ORDER — LABETALOL HCL 200 MG PO TABS
200.0000 mg | ORAL_TABLET | Freq: Three times a day (TID) | ORAL | Status: DC
  Administered 2011-06-10: 200 mg via ORAL
  Filled 2011-06-10 (×2): qty 1

## 2011-06-10 MED ORDER — POLYSACCHARIDE IRON 150 MG PO CAPS
150.0000 mg | ORAL_CAPSULE | Freq: Two times a day (BID) | ORAL | Status: DC
  Administered 2011-06-10 – 2011-06-21 (×23): 150 mg via ORAL
  Filled 2011-06-10 (×26): qty 1

## 2011-06-10 MED ORDER — SODIUM CHLORIDE 0.9 % IJ SOLN: 3.0000 mL | INTRAMUSCULAR | Status: DC | PRN

## 2011-06-10 MED ORDER — ZOLPIDEM TARTRATE 10 MG PO TABS: 10.0000 mg | ORAL_TABLET | Freq: Every evening | ORAL | Status: DC | PRN

## 2011-06-10 MED ORDER — SODIUM CHLORIDE 0.9 % IV SOLN: 250.0000 mL | INTRAVENOUS | Status: DC | PRN

## 2011-06-10 MED ORDER — SODIUM CHLORIDE 0.9 % IJ SOLN
3.0000 mL | Freq: Two times a day (BID) | INTRAMUSCULAR | Status: DC
  Administered 2011-06-10 – 2011-06-21 (×22): 3 mL via INTRAVENOUS

## 2011-06-10 MED ORDER — LABETALOL HCL 5 MG/ML IV SOLN
10.0000 mg | Freq: Once | INTRAVENOUS | Status: AC | PRN
  Filled 2011-06-10: qty 4

## 2011-06-10 MED ORDER — ACETAMINOPHEN 325 MG PO TABS: 650.0000 mg | ORAL_TABLET | ORAL | Status: DC | PRN

## 2011-06-10 NOTE — Progress Notes (Signed)
Pt transported to ultrasound via wheelchair.

## 2011-06-10 NOTE — H&P (Signed)
Krystal Hartman is a 20 y.o.morbidly obese black female who presents for admission secondary to 24 hr urine with total protein=308mg  06/09/11.  Pt was contacted w/ result around 1630 this afternoon, and Dr. Estanislado Hartman recommended pt be admitted with new diagnosis of superimposed PreEclampsia.  Pt denies PIH s/s today, and without other c/o's.  Had u/s 06/07/11 for BPP at office, along w/ GBS and GC/CT cx's.  Pt reports last growth u/s at "32 weeks."  Accompanied tonight by s.o.  Finished supper a littler after 6pm.  Denies recent illness, fever, VB, LOF.  Reports GFM.  Followed  By MD service at [facility].  Pt most recently admitted 12/6-12/13 and started on Labetalol 100mg  po bid, and increased to 200mg  po bid 06/05/11.  24 hr urine turned in 12/17, and total protein=297.  Previous 24 hr urine done 05/27/11 showed total protein =156.   Maternal Medical History:  Contractions: Frequency: rare.    Fetal activity: Perceived fetal activity is normal.   Last perceived fetal movement was within the past hour.    Prenatal complications: Polyhydramnios.   1.  Morbid obesity 2.  Treatment for MRSA infection during hospitalization 12/6-12/13 (ID consult during hospitalization--received IV ABX) 3.  CHTN--started on Labetalol last hospitaliztion 4.  Recurrent boils 5.  Previous c/s 6.  HSV 2 7.  Polyhydramnios 3rd trimester 8.  Elevated LFT's this pregnancy 9.  Fetal arrythmia on MFM u/s 05/26/11    OB History    Grav Para Term Preterm Abortions TAB SAB Ect Mult Living   3 1 1  0 1 0 1 0 0 1     Past Medical History  Diagnosis Date  . Hypertension   . MRSA (methicillin resistant Staphylococcus aureus) colonization    Past Surgical History  Procedure Date  . Eye surgery   . Cesarean section    Family History: family history includes Diabetes in her maternal aunt and mother. Social History:  reports that she has never smoked. She has never used smokeless tobacco. She reports that she does not drink  alcohol or use illicit drugs.  Review of Systems  Constitutional: Negative.   Eyes: Negative.   Respiratory: Negative.   Cardiovascular: Negative.   Gastrointestinal: Negative.   Genitourinary: Negative.   Skin: Negative.   Neurological: Negative.       Blood pressure 151/94, pulse 95, temperature 98.2 F (36.8 C), temperature source Oral, resp. rate 20, height 5\' 4"  (1.626 m), weight 176.903 kg (390 lb). Maternal Exam:  Uterine Assessment: Contraction strength is mild.  Contraction frequency is rare.   Abdomen: Patient reports no abdominal tenderness. Surgical scars: low transverse.   Introitus: not evaluated.   Cervix: not evaluated.   Fetal Exam Fetal Monitor Review: Mode: ultrasound.   Baseline rate: 140.  Variability: moderate (6-25 bpm).   Pattern: no accelerations and no decelerations.    Fetal State Assessment: Category I - tracings are normal.     Physical Exam  Constitutional: She is oriented to person, place, and time. She appears well-developed and well-nourished. No distress.  Cardiovascular: Normal rate and regular rhythm.   Respiratory: Effort normal and breath sounds normal.  GI: Soft. Bowel sounds are normal.       gravid  Genitourinary:       Pelvic deferred  Musculoskeletal: She exhibits edema.       1+ BLE/ankle & pedal  Neurological: She is alert and oriented to person, place, and time. She has normal reflexes.  Skin: Skin is warm and  dry.       Recent MRSA lesion on Lt upper back, healed, no drainage  Psychiatric: She has a normal mood and affect. Her behavior is normal. Judgment and thought content normal.   .. Results for orders placed during the hospital encounter of 06/10/11 (from the past 24 hour(s))  LACTATE DEHYDROGENASE     Status: Normal   Collection Time   06/10/11  7:59 PM      Component Value Range   LD 136  94 - 250 (U/L)  URIC ACID     Status: Normal   Collection Time   06/10/11  7:59 PM      Component Value Range   Uric  Acid, Serum 4.3  2.4 - 7.0 (mg/dL)  CBC     Status: Abnormal   Collection Time   06/10/11  7:59 PM      Component Value Range   WBC 7.0  4.0 - 10.5 (K/uL)   RBC 3.68 (*) 3.87 - 5.11 (MIL/uL)   Hemoglobin 9.0 (*) 12.0 - 15.0 (g/dL)   HCT 16.1 (*) 09.6 - 46.0 (%)   MCV 76.6 (*) 78.0 - 100.0 (fL)   MCH 24.5 (*) 26.0 - 34.0 (pg)   MCHC 31.9  30.0 - 36.0 (g/dL)   RDW 04.5 (*) 40.9 - 15.5 (%)   Platelets 288  150 - 400 (K/uL)  COMPREHENSIVE METABOLIC PANEL     Status: Abnormal   Collection Time   06/10/11  7:59 PM      Component Value Range   Sodium 138  135 - 145 (mEq/L)   Potassium 3.8  3.5 - 5.1 (mEq/L)   Chloride 107  96 - 112 (mEq/L)   CO2 21  19 - 32 (mEq/L)   Glucose, Bld 99  70 - 99 (mg/dL)   BUN 7  6 - 23 (mg/dL)   Creatinine, Ser 8.11  0.50 - 1.10 (mg/dL)   Calcium 8.8  8.4 - 91.4 (mg/dL)   Total Protein 7.1  6.0 - 8.3 (g/dL)   Albumin 2.4 (*) 3.5 - 5.2 (g/dL)   AST 42 (*) 0 - 37 (U/L)   ALT 44 (*) 0 - 35 (U/L)   Alkaline Phosphatase 86  39 - 117 (U/L)   Total Bilirubin 0.2 (*) 0.3 - 1.2 (mg/dL)   GFR calc non Af Amer >90  >90 (mL/min)   GFR calc Af Amer >90  >90 (mL/min)   Prenatal labs: ABO, Rh: --/--/A POS (03/10 0805) Antibody:  negative Rubella: Immune (07/27 0000) RPR: NON REACTIVE (12/06 2235)  HBsAg: Negative (07/27 0000)  HIV: Non-reactive (07/27 0000)  GBS:   done with GC/CT cx's 06/07/11 at [facility]  Assessment/Plan: 1.  IUP at 35.3 2.  CHTN w/ superimposed PreEclampsia 3.  Morbidly obese 4.  Polyhydramnios 3rd trimester 5.  Recent MRSA treatment 6.  Previous c/s 7.  Persisting elevated LFT's 8.  Hgb=9  1.  Admit to antenatal with Dr. Estanislado Hartman as attending 2.  Routine antenatal orders, CBG's 4x/day, CHO modified diet, and increase Labetalol to 200mg  po tid. 3.  Growth u/s with BPP and AFI  4.  Contact precautions 5.  PIH labs 6.  NST q shift 7.  MD to follow--preliminary plan if pt remains stable, delivery at 37 weeks 8.  Nu-iron  bid  Krystal Hartman H 06/10/2011, 8:45 PM

## 2011-06-11 LAB — GLUCOSE, CAPILLARY: Glucose-Capillary: 65 mg/dL — ABNORMAL LOW (ref 70–99)

## 2011-06-11 MED ORDER — LABETALOL HCL 5 MG/ML IV SOLN
10.0000 mg | INTRAVENOUS | Status: DC | PRN
  Administered 2011-06-21 – 2011-06-22 (×2): 10 mg via INTRAVENOUS
  Filled 2011-06-11 (×2): qty 4

## 2011-06-11 MED ORDER — LABETALOL HCL 300 MG PO TABS
300.0000 mg | ORAL_TABLET | Freq: Three times a day (TID) | ORAL | Status: DC
  Administered 2011-06-11 – 2011-06-21 (×31): 300 mg via ORAL
  Filled 2011-06-11 (×32): qty 1

## 2011-06-11 NOTE — Progress Notes (Addendum)
S:  Doing well.  Denies HA, visual symptoms, epigastric pain.  Reports no increase in swelling, reports +FM, denies UCs.  Had [place] last evening, but final report not present in chart.  OCeasar Mons Vitals:   06/10/11 2130 06/10/11 2230 06/10/11 2330 06/11/11 0815  BP:    155/72  Pulse:    83  Temp:    98.7 F (37.1 C)  TempSrc:    Oral  Resp: 20 20 20 18   Height:      Weight:       Results for orders placed during the hospital encounter of 06/10/11 (from the past 24 hour(s))  LACTATE DEHYDROGENASE     Status: Normal   Collection Time   06/10/11  7:59 PM      Component Value Range   LD 136  94 - 250 (U/L)  URIC ACID     Status: Normal   Collection Time   06/10/11  7:59 PM      Component Value Range   Uric Acid, Serum 4.3  2.4 - 7.0 (mg/dL)  CBC     Status: Abnormal   Collection Time   06/10/11  7:59 PM      Component Value Range   WBC 7.0  4.0 - 10.5 (K/uL)   RBC 3.68 (*) 3.87 - 5.11 (MIL/uL)   Hemoglobin 9.0 (*) 12.0 - 15.0 (g/dL)   HCT 16.1 (*) 09.6 - 46.0 (%)   MCV 76.6 (*) 78.0 - 100.0 (fL)   MCH 24.5 (*) 26.0 - 34.0 (pg)   MCHC 31.9  30.0 - 36.0 (g/dL)   RDW 04.5 (*) 40.9 - 15.5 (%)   Platelets 288  150 - 400 (K/uL)  COMPREHENSIVE METABOLIC PANEL     Status: Abnormal   Collection Time   06/10/11  7:59 PM      Component Value Range   Sodium 138  135 - 145 (mEq/L)   Potassium 3.8  3.5 - 5.1 (mEq/L)   Chloride 107  96 - 112 (mEq/L)   CO2 21  19 - 32 (mEq/L)   Glucose, Bld 99  70 - 99 (mg/dL)   BUN 7  6 - 23 (mg/dL)   Creatinine, Ser 8.11  0.50 - 1.10 (mg/dL)   Calcium 8.8  8.4 - 91.4 (mg/dL)   Total Protein 7.1  6.0 - 8.3 (g/dL)   Albumin 2.4 (*) 3.5 - 5.2 (g/dL)   AST 42 (*) 0 - 37 (U/L)   ALT 44 (*) 0 - 35 (U/L)   Alkaline Phosphatase 86  39 - 117 (U/L)   Total Bilirubin 0.2 (*) 0.3 - 1.2 (mg/dL)   GFR calc non Af Amer >90  >90 (mL/min)   GFR calc Af Amer >90  >90 (mL/min)  URINALYSIS, ROUTINE W REFLEX MICROSCOPIC     Status: Abnormal   Collection Time   06/10/11 10:30 PM      Component Value Range   Color, Urine YELLOW  YELLOW    APPearance CLEAR  CLEAR    Specific Gravity, Urine 1.010  1.005 - 1.030    pH 6.5  5.0 - 8.0    Glucose, UA NEGATIVE  NEGATIVE (mg/dL)   Hgb urine dipstick NEGATIVE  NEGATIVE    Bilirubin Urine NEGATIVE  NEGATIVE    Ketones, ur NEGATIVE  NEGATIVE (mg/dL)   Protein, ur NEGATIVE  NEGATIVE (mg/dL)   Urobilinogen, UA 0.2  0.0 - 1.0 (mg/dL)   Nitrite NEGATIVE  NEGATIVE    Leukocytes, UA SMALL (*)  NEGATIVE   URINE MICROSCOPIC-ADD ON     Status: Abnormal   Collection Time   06/10/11 10:30 PM      Component Value Range   Squamous Epithelial / LPF MANY (*) RARE    WBC, UA 7-10  <3 (WBC/hpf)   Urine-Other MUCOUS PRESENT    GLUCOSE, CAPILLARY     Status: Abnormal   Collection Time   06/11/11  6:13 AM      Component Value Range   Glucose-Capillary 65 (*) 70 - 99 (mg/dL)  GLUCOSE, CAPILLARY     Status: Abnormal   Collection Time   06/11/11  9:51 AM      Component Value Range   Glucose-Capillary 133 (*) 70 - 99 (mg/dL)    Chest clear. Heart RRR without murmur Abd gravid, NT Ext WNL, negative Homan's, 1+ edema  FHR reactive on evening tracing. No UCs  On Labetolol 200 mg po TID since admission. FBS 65 1 hr pp 133  A:  IUP at 35 4/7 weeks      Chronic hypertension with superimposed pre-eclampsia      Elevated LFTs this pregnancy      Fetal arrythmia on MFM U/S 12 7/12--no audible on evening tracing.      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      Polyhydramnios      Hx previous C/S      Anemia  P: Continue current care      Check with [place] department regarding last night's ultrasound report.      Dr. Estanislado Pandy will see and determine further plan of care (? When to repeat labs).      On carb modified diet  Nigel Bridgeman, CNM, [place] 06/11/11 8:35am     Addendum:   06/10/11 10pm [place] report:  Vtx, AFI 18.21 cm, 67%ile, EFW 7 lbs, >90%ile.  BPP 6/8, minus 2  for FBM.  Nigel Bridgeman, CNM, [place]  06/11/11 9:50am

## 2011-06-12 LAB — COMPREHENSIVE METABOLIC PANEL
AST: 45 U/L — ABNORMAL HIGH (ref 0–37)
Albumin: 2.2 g/dL — ABNORMAL LOW (ref 3.5–5.2)
Calcium: 8.9 mg/dL (ref 8.4–10.5)
Chloride: 106 mEq/L (ref 96–112)
Creatinine, Ser: 0.67 mg/dL (ref 0.50–1.10)
Total Protein: 7.3 g/dL (ref 6.0–8.3)

## 2011-06-12 LAB — GLUCOSE, CAPILLARY
Glucose-Capillary: 80 mg/dL (ref 70–99)
Glucose-Capillary: 110 mg/dL — ABNORMAL HIGH (ref 70–99)
Glucose-Capillary: 87 mg/dL (ref 70–99)
Glucose-Capillary: 105 mg/dL — ABNORMAL HIGH (ref 70–99)

## 2011-06-12 LAB — CBC
MCH: 24.2 pg — ABNORMAL LOW (ref 26.0–34.0)
MCV: 77.2 fL — ABNORMAL LOW (ref 78.0–100.0)
Platelets: 273 10*3/uL (ref 150–400)
RDW: 18.8 % — ABNORMAL HIGH (ref 11.5–15.5)
WBC: 7.3 10*3/uL (ref 4.0–10.5)

## 2011-06-12 NOTE — Progress Notes (Signed)
UR chart review completed.  

## 2011-06-12 NOTE — Progress Notes (Signed)
20 y.o. year old female,at [redacted]w[redacted]d gestation.  SUBJECTIVE:  No headaches, blurred vision, or right upper quadrant tenderness.  OBJECTIVE:  BP 153/84  Pulse 97  Temp(Src) 98.3 F (36.8 C) (Oral)  Resp 20  Ht 5\' 4"  (1.626 m)  Wt 178.264 kg (393 lb)  BMI 67.46 kg/m2  Fetal Heart Tones:  Cat 1  Contractions:          none  Reflexes: Normal  Abdomin: Nontender  CBC    Component Value Date/Time   WBC 7.3 06/12/2011 0559   RBC 3.56* 06/12/2011 0559   HGB 8.6* 06/12/2011 0559   HCT 27.5* 06/12/2011 0559   PLT 273 06/12/2011 0559   MCV 77.2* 06/12/2011 0559   MCH 24.2* 06/12/2011 0559   MCHC 31.3 06/12/2011 0559   RDW 18.8* 06/12/2011 0559   LYMPHSABS 2.1 05/27/2011 0514   MONOABS 0.7 05/27/2011 0514   EOSABS 0.1 05/27/2011 0514   BASOSABS 0.0 05/27/2011 0514    CMP     Component Value Date/Time   NA 136 06/12/2011 0559   K 3.9 06/12/2011 0559   CL 106 06/12/2011 0559   CO2 21 06/12/2011 0559   GLUCOSE 92 06/12/2011 0559   BUN 10 06/12/2011 0559   CREATININE 0.67 06/12/2011 0559   CREATININE 0.54 11/08/2009 1123   CALCIUM 8.9 06/12/2011 0559   PROT 7.3 06/12/2011 0559   ALBUMIN 2.2* 06/12/2011 0559   AST 45* 06/12/2011 0559   ALT 42* 06/12/2011 0559   ALKPHOS 77 06/12/2011 0559   BILITOT 0.2* 06/12/2011 0559   GFRNONAA >90 06/12/2011 0559   GFRAA >90 06/12/2011 0559     ASSESSMENT:  [redacted]w[redacted]d Weeks Pregnancy  PreEclampsia- Mild  PLAN:  Continue Obs  Leonard Schwartz, M.D.

## 2011-06-13 LAB — COMPREHENSIVE METABOLIC PANEL
AST: 62 U/L — ABNORMAL HIGH (ref 0–37)
Albumin: 2.2 g/dL — ABNORMAL LOW (ref 3.5–5.2)
BUN: 11 mg/dL (ref 6–23)
Calcium: 9.4 mg/dL (ref 8.4–10.5)
Creatinine, Ser: 0.63 mg/dL (ref 0.50–1.10)
GFR calc non Af Amer: >90 mL/min (ref >90)

## 2011-06-13 LAB — CBC
MCH: 24.1 pg — ABNORMAL LOW (ref 26.0–34.0)
MCV: 77.8 fL — ABNORMAL LOW (ref 78.0–100.0)
Platelets: 261 10*3/uL (ref 150–400)
RDW: 18.9 % — ABNORMAL HIGH (ref 11.5–15.5)

## 2011-06-13 LAB — GLUCOSE, CAPILLARY
Glucose-Capillary: 90 mg/dL (ref 70–99)
Glucose-Capillary: 98 mg/dL (ref 70–99)

## 2011-06-13 NOTE — Progress Notes (Signed)
Krystal Hartman is a 20 y.o. G3P1011 at [redacted]w[redacted]d  admitted for superimposed preEclampsia w/ elevated 24 hr urine 06/09/11. Subjective: Pt without c/o's this AM.  Ate pizza late last night, which may explain elevated serum glucose this AM on PIH labs.  GFM.  Rare ctx.  Denies PIH s/s.  S.o. At bedside and another female visitor as well.  No LOF or VB.  Feels LE edema unchanged since admission.    Objective: BP 151/70  Pulse 85  Temp(Src) 97.8 F (36.6 C) (Oral)  Resp 20  Ht 5\' 4"  (1.626 m)  Wt 178.445 kg (393 lb 6.4 oz)  BMI 67.53 kg/m2 I/O last 3 completed shifts: In: 1740 [P.O.:1740] Out: 3150 [Urine:3150] Total I/O In: 600 [P.O.:600] Out: 800 [Urine:800] .Marland Kitchen Filed Vitals:   06/12/11 2345 06/13/11 0045 06/13/11 0609 06/13/11 1024  BP:   153/79 151/70  Pulse:   75 85  Temp:   98.5 F (36.9 C) 97.8 F (36.6 C)  TempSrc:   Oral Oral  Resp: 20 20 20 20   Height:      Weight:       Results for orders placed during the hospital encounter of 06/10/11 (from the past 24 hour(s))  GLUCOSE, CAPILLARY     Status: Normal   Collection Time   06/12/11  4:36 PM      Component Value Range   Glucose-Capillary 87  70 - 99 (mg/dL)   Comment 1 Notify RN     Comment 2 Documented in Chart    GLUCOSE, CAPILLARY     Status: Abnormal   Collection Time   06/12/11  9:46 PM      Component Value Range   Glucose-Capillary 105 (*) 70 - 99 (mg/dL)  CBC     Status: Abnormal   Collection Time   06/13/11  5:05 AM      Component Value Range   WBC 7.9  4.0 - 10.5 (K/uL)   RBC 3.65 (*) 3.87 - 5.11 (MIL/uL)   Hemoglobin 8.8 (*) 12.0 - 15.0 (g/dL)   HCT 16.1 (*) 09.6 - 46.0 (%)   MCV 77.8 (*) 78.0 - 100.0 (fL)   MCH 24.1 (*) 26.0 - 34.0 (pg)   MCHC 31.0  30.0 - 36.0 (g/dL)   RDW 04.5 (*) 40.9 - 15.5 (%)   Platelets 261  150 - 400 (K/uL)  COMPREHENSIVE METABOLIC PANEL     Status: Abnormal   Collection Time   06/13/11  5:05 AM      Component Value Range   Sodium 135  135 - 145 (mEq/L)   Potassium 4.0   3.5 - 5.1 (mEq/L)   Chloride 105  96 - 112 (mEq/L)   CO2 22  19 - 32 (mEq/L)   Glucose, Bld 111 (*) 70 - 99 (mg/dL)   BUN 11  6 - 23 (mg/dL)   Creatinine, Ser 8.11  0.50 - 1.10 (mg/dL)   Calcium 9.4  8.4 - 91.4 (mg/dL)   Total Protein 7.4  6.0 - 8.3 (g/dL)   Albumin 2.2 (*) 3.5 - 5.2 (g/dL)   AST 62 (*) 0 - 37 (U/L)   ALT 52 (*) 0 - 35 (U/L)   Alkaline Phosphatase 81  39 - 117 (U/L)   Total Bilirubin 0.1 (*) 0.3 - 1.2 (mg/dL)   GFR calc non Af Amer >90  >90 (mL/min)   GFR calc Af Amer >90  >90 (mL/min)  GLUCOSE, CAPILLARY     Status: Normal   Collection Time  06/13/11  6:08 AM      Component Value Range   Glucose-Capillary 90  70 - 99 (mg/dL)  GLUCOSE, CAPILLARY     Status: Normal   Collection Time   06/13/11 11:34 AM      Component Value Range   Glucose-Capillary 98  70 - 99 (mg/dL)   Comment 1 Notify RN     Comment 2 Documented in Chart     FHT:  FHR: 140 bpm, variability: moderate,  accelerations:  Present,  decelerations:  Absent UC:   Rare PE:  Gen:  NAD, A&Ox3, pleasant         CV:  RRR         Lungs:  CTA B         Abd:  Soft, NT gravid         Pelvic: deferred         Ext:  1-2+ BLE pitting edema; No clonus; DTRs 1+  Labs: Lab Results  Component Value Date   WBC 7.9 06/13/2011   HGB 8.8* 06/13/2011   HCT 28.4* 06/13/2011   MCV 77.8* 06/13/2011   PLT 261 06/13/2011    Assessment / Plan: 1.  IUP 35.6  2.  CHTN w/ superimposed PreEclampsia  3.  Elevated LFTs--highest today since admission  4.  MRSA trx mid Dec when hospitalized  5.  Prev c/s  6.  Morbidly obese  6.  Polyhydramnios 3rd trimester--resolved on u/s 06/10/11  7.  Anemia  8. Questionable FHR Arrythmia  Preeclampsia:  Stable PreEclampsia--mild Fetal Wellbeing:  Category I Anticipated MOD:  c/s rec'd by Dr. Estanislado Pandy secondary to success rate per VBAC calculator=17%  1.  Continue Labetalol 300mg  po tid;  PIH labs daily 2.  Per dr. Pennie Rushing, will d/c CBG's--have been WNL since readmission; will check  serum glucose w/ PIH labs daily.  Athalee Esterline H 06/13/2011, 12:37 PM

## 2011-06-14 ENCOUNTER — Inpatient Hospital Stay (HOSPITAL_COMMUNITY): Payer: Medicaid Other

## 2011-06-14 LAB — CBC
MCHC: 31.4 g/dL (ref 30.0–36.0)
MCV: 77.1 fL — ABNORMAL LOW (ref 78.0–100.0)
Platelets: 290 10*3/uL (ref 150–400)
RDW: 19.6 % — ABNORMAL HIGH (ref 11.5–15.5)
WBC: 7.1 10*3/uL (ref 4.0–10.5)

## 2011-06-14 LAB — COMPREHENSIVE METABOLIC PANEL
AST: 58 U/L — ABNORMAL HIGH (ref 0–37)
Albumin: 2.2 g/dL — ABNORMAL LOW (ref 3.5–5.2)
Calcium: 8.7 mg/dL (ref 8.4–10.5)
Chloride: 106 mEq/L (ref 96–112)
Creatinine, Ser: 0.57 mg/dL (ref 0.50–1.10)
Total Bilirubin: 0.2 mg/dL — ABNORMAL LOW (ref 0.3–1.2)
Total Protein: 7 g/dL (ref 6.0–8.3)

## 2011-06-14 NOTE — Progress Notes (Signed)
Patient ID: Krystal Hartman, female   DOB: [birth year], 20 y.o.   MRN: [id number] Pt without complaints.  No leakage of fluid or VB.  Good FM.  No headache, blurred vision or RUQ pain  BP 154/87  Pulse 87  Temp(Src) 98.4 F (36.9 C) (Oral)  Resp 20  Ht 5\' 4"  (1.626 m)  Wt 177.991 kg (392 lb 6.4 oz)  BMI 67.36 kg/m2  FHTS Baseline: 135 bpm and Accelerations: Reactive  Toco none  Pt in NAD CV RRR Lungs CTAB abd  Gravid soft and NT GU no vb EXt no calf tenderness Results for orders placed during the hospital encounter of 06/10/11 (from the past 72 hour(s))  GLUCOSE, CAPILLARY     Status: Abnormal   Collection Time   06/11/11  2:36 PM      Component Value Range Comment   Glucose-Capillary 112 (*) 70 - 99 (mg/dL)   GLUCOSE, CAPILLARY     Status: Abnormal   Collection Time   06/11/11  7:23 PM      Component Value Range Comment   Glucose-Capillary 113 (*) 70 - 99 (mg/dL)   CBC     Status: Abnormal   Collection Time   06/12/11  5:59 AM      Component Value Range Comment   WBC 7.3  4.0 - 10.5 (K/uL)    RBC 3.56 (*) 3.87 - 5.11 (MIL/uL)    Hemoglobin 8.6 (*) 12.0 - 15.0 (g/dL)    HCT 40.9 (*) 81.1 - 46.0 (%)    MCV 77.2 (*) 78.0 - 100.0 (fL)    MCH 24.2 (*) 26.0 - 34.0 (pg)    MCHC 31.3  30.0 - 36.0 (g/dL)    RDW 91.4 (*) 78.2 - 15.5 (%)    Platelets 273  150 - 400 (K/uL)   COMPREHENSIVE METABOLIC PANEL     Status: Abnormal   Collection Time   06/12/11  5:59 AM      Component Value Range Comment   Sodium 136  135 - 145 (mEq/L)    Potassium 3.9  3.5 - 5.1 (mEq/L)    Chloride 106  96 - 112 (mEq/L)    CO2 21  19 - 32 (mEq/L)    Glucose, Bld 92  70 - 99 (mg/dL)    BUN 10  6 - 23 (mg/dL)    Creatinine, Ser 9.56  0.50 - 1.10 (mg/dL)    Calcium 8.9  8.4 - 10.5 (mg/dL)    Total Protein 7.3  6.0 - 8.3 (g/dL)    Albumin 2.2 (*) 3.5 - 5.2 (g/dL)    AST 45 (*) 0 - 37 (U/L)    ALT 42 (*) 0 - 35 (U/L)    Alkaline Phosphatase 77  39 - 117 (U/L)    Total Bilirubin 0.2 (*) 0.3 -  1.2 (mg/dL)    GFR calc non Af Amer >90  >90 (mL/min)    GFR calc Af Amer >90  >90 (mL/min)   GLUCOSE, CAPILLARY     Status: Normal   Collection Time   06/12/11  6:20 AM      Component Value Range Comment   Glucose-Capillary 80  70 - 99 (mg/dL)   GLUCOSE, CAPILLARY     Status: Abnormal   Collection Time   06/12/11 11:33 AM      Component Value Range Comment   Glucose-Capillary 110 (*) 70 - 99 (mg/dL)    Comment 1 Documented in Chart      Comment 2 Notify RN  GLUCOSE, CAPILLARY     Status: Normal   Collection Time   06/12/11  4:36 PM      Component Value Range Comment   Glucose-Capillary 87  70 - 99 (mg/dL)    Comment 1 Notify RN      Comment 2 Documented in Chart     GLUCOSE, CAPILLARY     Status: Abnormal   Collection Time   06/12/11  9:46 PM      Component Value Range Comment   Glucose-Capillary 105 (*) 70 - 99 (mg/dL)   CBC     Status: Abnormal   Collection Time   06/13/11  5:05 AM      Component Value Range Comment   WBC 7.9  4.0 - 10.5 (K/uL)    RBC 3.65 (*) 3.87 - 5.11 (MIL/uL)    Hemoglobin 8.8 (*) 12.0 - 15.0 (g/dL)    HCT 16.1 (*) 09.6 - 46.0 (%)    MCV 77.8 (*) 78.0 - 100.0 (fL)    MCH 24.1 (*) 26.0 - 34.0 (pg)    MCHC 31.0  30.0 - 36.0 (g/dL)    RDW 04.5 (*) 40.9 - 15.5 (%)    Platelets 261  150 - 400 (K/uL)   COMPREHENSIVE METABOLIC PANEL     Status: Abnormal   Collection Time   06/13/11  5:05 AM      Component Value Range Comment   Sodium 135  135 - 145 (mEq/L)    Potassium 4.0  3.5 - 5.1 (mEq/L)    Chloride 105  96 - 112 (mEq/L)    CO2 22  19 - 32 (mEq/L)    Glucose, Bld 111 (*) 70 - 99 (mg/dL)    BUN 11  6 - 23 (mg/dL)    Creatinine, Ser 8.11  0.50 - 1.10 (mg/dL)    Calcium 9.4  8.4 - 10.5 (mg/dL)    Total Protein 7.4  6.0 - 8.3 (g/dL)    Albumin 2.2 (*) 3.5 - 5.2 (g/dL)    AST 62 (*) 0 - 37 (U/L)    ALT 52 (*) 0 - 35 (U/L)    Alkaline Phosphatase 81  39 - 117 (U/L)    Total Bilirubin 0.1 (*) 0.3 - 1.2 (mg/dL)    GFR calc non Af Amer >90   >90 (mL/min)    GFR calc Af Amer >90  >90 (mL/min)   GLUCOSE, CAPILLARY     Status: Normal   Collection Time   06/13/11  6:08 AM      Component Value Range Comment   Glucose-Capillary 90  70 - 99 (mg/dL)   GLUCOSE, CAPILLARY     Status: Normal   Collection Time   06/13/11 11:34 AM      Component Value Range Comment   Glucose-Capillary 98  70 - 99 (mg/dL)    Comment 1 Notify RN      Comment 2 Documented in Chart     CBC     Status: Abnormal   Collection Time   06/14/11  5:40 AM      Component Value Range Comment   WBC 7.1  4.0 - 10.5 (K/uL)    RBC 3.67 (*) 3.87 - 5.11 (MIL/uL)    Hemoglobin 8.9 (*) 12.0 - 15.0 (g/dL)    HCT 91.4 (*) 78.2 - 46.0 (%)    MCV 77.1 (*) 78.0 - 100.0 (fL)    MCH 24.3 (*) 26.0 - 34.0 (pg)    MCHC 31.4  30.0 - 36.0 (g/dL)  RDW 19.6 (*) 11.5 - 15.5 (%)    Platelets 290  150 - 400 (K/uL)   COMPREHENSIVE METABOLIC PANEL     Status: Abnormal   Collection Time   06/14/11  5:40 AM      Component Value Range Comment   Sodium 136  135 - 145 (mEq/L)    Potassium 4.0  3.5 - 5.1 (mEq/L)    Chloride 106  96 - 112 (mEq/L)    CO2 21  19 - 32 (mEq/L)    Glucose, Bld 85  70 - 99 (mg/dL)    BUN 10  6 - 23 (mg/dL)    Creatinine, Ser 4.54  0.50 - 1.10 (mg/dL)    Calcium 8.7  8.4 - 10.5 (mg/dL)    Total Protein 7.0  6.0 - 8.3 (g/dL)    Albumin 2.2 (*) 3.5 - 5.2 (g/dL)    AST 58 (*) 0 - 37 (U/L)    ALT 54 (*) 0 - 35 (U/L)    Alkaline Phosphatase 81  39 - 117 (U/L)    Total Bilirubin 0.2 (*) 0.3 - 1.2 (mg/dL)    GFR calc non Af Amer >90  >90 (mL/min)    GFR calc Af Amer >90  >90 (mL/min)     Assessment and Plan [redacted]w[redacted]d  CHTN with superimposed preeclampsia. BPP8/8 Plan to follow and deliver at 37 weeks unless pt becomes worse before then

## 2011-06-14 NOTE — Progress Notes (Signed)
[redacted] weeks gestation, with preeclampsia, gestational diabetes.  Height  64" Weight 392 Lbs pre-pregnancy weight 370 Lbs.Pre-pregnancy  BMI 63.5  IBW 120 Lbs  Total weight gain 22 Lbs. Weight gain goals 11-20 Lbs.   Estimated needs: 23-2500 kcal/day, 90-100 grams protein/day, 2.3 liters fluid/day carbohydrate modified gestational  diet tolerated well Current diet prescription will provide for increased needs. No abnormal nutrition related labs. Glucose 85 - 111  Nutrition Dx: Increased nutrient needs r/t pregnancy and fetal growth requirements aeb [redacted] weeks gestation.  No educational needs assessed at this time. Diet education completed previous admission, 12/10

## 2011-06-15 LAB — CBC
HCT: 30.1 % — ABNORMAL LOW (ref 36.0–46.0)
Hemoglobin: 9.4 g/dL — ABNORMAL LOW (ref 12.0–15.0)
MCH: 24.1 pg — ABNORMAL LOW (ref 26.0–34.0)
MCV: 77.2 fL — ABNORMAL LOW (ref 78.0–100.0)
RBC: 3.9 MIL/uL (ref 3.87–5.11)
WBC: 7.5 10*3/uL (ref 4.0–10.5)

## 2011-06-15 LAB — COMPREHENSIVE METABOLIC PANEL
BUN: 10 mg/dL (ref 6–23)
CO2: 20 mEq/L (ref 19–32)
Calcium: 9.4 mg/dL (ref 8.4–10.5)
Chloride: 103 mEq/L (ref 96–112)
Creatinine, Ser: 0.56 mg/dL (ref 0.50–1.10)
GFR calc Af Amer: >90 mL/min (ref >90)
GFR calc non Af Amer: >90 mL/min (ref >90)
Glucose, Bld: 84 mg/dL (ref 70–99)
Total Bilirubin: 0.2 mg/dL — ABNORMAL LOW (ref 0.3–1.2)

## 2011-06-15 NOTE — Progress Notes (Signed)
Krystal Hartman is a 20 y.o. G3P1011 at [redacted]w[redacted]d by  admitted for CHNT superimposed pre eclampsia  Hospital Day No: 3  Subjective: Denies headache, visual spots or blurring, no swelling hands, face, ankles, no srom or vag bleeding, baby is active  Prenatal labs:  Pregnancy complications: chronic HTN, pre-eclampsia, hx of C/S  Objective: BP 141/70  Pulse 89  Temp(Src) 97.7 F (36.5 C) (Oral)  Resp 22  Ht 5\' 4"  (1.626 m)  Wt 176.177 kg (388 lb 6.4 oz)  BMI 66.67 kg/m2  SpO2 97% I/O last 3 completed shifts: In: 1070 [P.O.:1070] Out: 1750 [Urine:1750]    Physical Exam:  Gen: calm, quiet, resting in bed Chest/Lungs: cta bilaterally  Heart/Pulse: RRR  Abdomen: soft, gravid, nontender, BX x4 quad Uterine fundus: soft, nontender Skin & Color: warm and dry  Neurological DTRS +1 bilaterally no clonus EXT: negative Homan's b/l, edema trace to lower legs  FHT:  FHR: 120 bpm, variability: moderate,  accelerations:  Present,  decelerations:  Absent UC:   without} SVE:    NA  Labs: Lab Results  Component Value Date   WBC 7.5 06/15/2011   HGB 9.4* 06/15/2011   HCT 30.1* 06/15/2011   MCV 77.2* 06/15/2011   PLT 279 06/15/2011    Assessment and Plan: 36 1/7 week IUP CHTN Pre eclampsia Hx C/S Elevated LF Obesity Plan continue care   [facility], Krystal Hartman 06/15/2011, 11:40 AM

## 2011-06-15 NOTE — Progress Notes (Addendum)
I agree with the CNM note. Continue to follow as an inpatient. Fasting blood sugar was 84.  Krystal Hartman.D.

## 2011-06-16 LAB — CBC
Hemoglobin: 8.9 g/dL — ABNORMAL LOW (ref 12.0–15.0)
MCH: 23.9 pg — ABNORMAL LOW (ref 26.0–34.0)
Platelets: 254 10*3/uL (ref 150–400)
RBC: 3.72 MIL/uL — ABNORMAL LOW (ref 3.87–5.11)

## 2011-06-16 LAB — COMPREHENSIVE METABOLIC PANEL
ALT: 65 U/L — ABNORMAL HIGH (ref 0–35)
AST: 72 U/L — ABNORMAL HIGH (ref 0–37)
Alkaline Phosphatase: 83 U/L (ref 39–117)
CO2: 22 mEq/L (ref 19–32)
Calcium: 9.1 mg/dL (ref 8.4–10.5)
GFR calc Af Amer: >90 mL/min (ref >90)
Glucose, Bld: 84 mg/dL (ref 70–99)
Potassium: 4.1 mEq/L (ref 3.5–5.1)
Sodium: 136 mEq/L (ref 135–145)
Total Protein: 6.9 g/dL (ref 6.0–8.3)

## 2011-06-16 NOTE — Progress Notes (Signed)
UR Chart review completed.  

## 2011-06-16 NOTE — Progress Notes (Addendum)
Krystal Hartman is a 20 y.o. G3P1011   Hospital Day No:   Subjective: Do I have my C/S scheduled? Denies needs, Denies ha, visual problems, epigastric pain, uc, srom, or vag bleeding, or swelling with +FM.   Prenatal labs:  Pregnancy complications: CHTN, Pre eclampsia  Objective: BP 149/93  Pulse 96  Temp(Src) 98.2 F (36.8 C) (Oral)  Resp 20  Ht 5\' 4"  (1.626 m)  Wt 391 lb 12.8 oz (177.719 kg)  BMI 67.25 kg/m2  SpO2 97% I/O last 3 completed shifts: In: 960 [P.O.:960] Out: 1650 [Urine:1650] Total I/O In: 360 [P.O.:360] Out: 500 [Urine:500]  Physical Exam:  Gen: alert, cooperative, no distress Chest/Lungs: cta bilaterally  Heart/Pulse: RRR  Abdomen: soft, gravid, nontender, BX x4 quad Uterine fundus: soft, nontender Skin & Color: warm and dry  Neurological: AOx3, DTRs absent EXT: negative Homan's b/l, edema none  FHT:  120s LTV Mod accels UC:   none SVE:    NA Labs" LDH 136 uric acid 4.3 Creatinine 0.6 CBC WNL exception hgy 8.9 Labs: Lab Results  Component Value Date   WBC 7.5 06/16/2011   HGB 8.9* 06/16/2011   HCT 29.0* 06/16/2011   MCV 78.0 06/16/2011   PLT 254 06/16/2011  Assessment and Plan: has Morbid obesity; History of MRSA infection; Recurrent boils; HSV-2 (herpes simplex virus 2) infection; Polyhydramnios in third trimester; Anemia; and Chronic hypertension with superimposed preeclampsia on her problem list. chronic HTN, pre-eclampsia, hx of C/S Anemia P: continue care will check on C/S date.   KREBSBACH, MARY 06/16/2011, 11:21 AM   Agree with above and will discuss with MD covering at 37wks to arrange pt getting scheduled.  LFTs are stable. - AYR

## 2011-06-17 ENCOUNTER — Inpatient Hospital Stay (HOSPITAL_COMMUNITY): Payer: Medicaid Other

## 2011-06-17 LAB — CBC
HCT: 29.3 % — ABNORMAL LOW (ref 36.0–46.0)
Hemoglobin: 9.1 g/dL — ABNORMAL LOW (ref 12.0–15.0)
MCH: 24.1 pg — ABNORMAL LOW (ref 26.0–34.0)
MCHC: 31.1 g/dL (ref 30.0–36.0)
RDW: 19.1 % — ABNORMAL HIGH (ref 11.5–15.5)

## 2011-06-17 LAB — COMPREHENSIVE METABOLIC PANEL
Albumin: 2.3 g/dL — ABNORMAL LOW (ref 3.5–5.2)
BUN: 11 mg/dL (ref 6–23)
Calcium: 8.9 mg/dL (ref 8.4–10.5)
Creatinine, Ser: 0.56 mg/dL (ref 0.50–1.10)
GFR calc Af Amer: >90 mL/min (ref >90)
Glucose, Bld: 95 mg/dL (ref 70–99)
Total Protein: 7 g/dL (ref 6.0–8.3)

## 2011-06-17 NOTE — Progress Notes (Signed)
Monitor strip read for aicu rn

## 2011-06-17 NOTE — Progress Notes (Signed)
Patient ID: Krystal Hartman, female   DOB: [birth year], 20 y.o.   MRN: [id number] Krystal Hartman is a 20 y.o. G3P1011 at [redacted]w[redacted]d admitted for [facility] with SI Preeclampsia.  Hospital Day No: 8  Subjective: No complaints  Prenatal labs:24hr urine with >300mg  protein, LFTs elevated  Pregnancy complications: pre-eclampsia  Objective: BP 144/73  Pulse 84  Temp(Src) 97.6 F (36.4 C) (Oral)  Resp 20  Ht 5\' 4"  (1.626 m)  Wt 177.719 kg (391 lb 12.8 oz)  BMI 67.25 kg/m2  SpO2 97% I/O last 3 completed shifts: In: 2380 [P.O.:2380] Out: 5050 [Urine:5050] Total I/O In: 480 [P.O.:480] Out: 650 [Urine:650]  Physical Exam:  Gen: alert Chest/Lungs: cta bilaterally  Heart/Pulse: RRR  Abdomen: soft, gravid, nontender, BX x4 quad Uterine fundus: soft, nontender Skin & Color: warm and dry  Neurological: AOx3, DTRs 2+ EXT: negative Homan's b/l, edema 1+  FHT:  FHR: 130s-140s bpm, variability: moderate,  accelerations:  Present,  decelerations:  Absent UC:   none SVE:    deferred  BPP 8/10 (-2 for breathing) AFI 28  Labs: Lab Results  Component Value Date   WBC 7.2 06/17/2011   HGB 9.1* 06/17/2011   HCT 29.3* 06/17/2011   MCV 77.7* 06/17/2011   PLT 277 06/17/2011    Assessment and Plan: has Morbid obesity; History of MRSA infection; Recurrent boils; HSV-2 (herpes simplex virus 2) infection; Polyhydramnios in third trimester; Anemia; and Chronic hypertension with superimposed preeclampsia on her problem list. 20yo at 70 3/7wks admitted with [facility] and SI preeclampsia currently stable.  Polyhydramnios. Fetal status is overall reassuring.  Will repeat BPP in am.  LFTs are stable.   Purcell Nails 06/17/2011, 12:35 PM

## 2011-06-18 ENCOUNTER — Inpatient Hospital Stay (HOSPITAL_COMMUNITY): Payer: Medicaid Other

## 2011-06-18 LAB — COMPREHENSIVE METABOLIC PANEL
Albumin: 2.3 g/dL — ABNORMAL LOW (ref 3.5–5.2)
BUN: 11 mg/dL (ref 6–23)
Creatinine, Ser: 0.56 mg/dL (ref 0.50–1.10)
GFR calc Af Amer: >90 mL/min (ref >90)
Total Protein: 7.3 g/dL (ref 6.0–8.3)

## 2011-06-18 LAB — CBC
HCT: 29.2 % — ABNORMAL LOW (ref 36.0–46.0)
MCH: 23.9 pg — ABNORMAL LOW (ref 26.0–34.0)
MCHC: 30.5 g/dL (ref 30.0–36.0)
MCV: 78.3 fL (ref 78.0–100.0)
RDW: 19 % — ABNORMAL HIGH (ref 11.5–15.5)

## 2011-06-18 LAB — URIC ACID: Uric Acid, Serum: 4.4 mg/dL (ref 2.4–7.0)

## 2011-06-18 MED ORDER — VALACYCLOVIR HCL 500 MG PO TABS
500.0000 mg | ORAL_TABLET | Freq: Every day | ORAL | Status: DC
  Administered 2011-06-18 – 2011-06-21 (×4): 500 mg via ORAL
  Filled 2011-06-18 (×6): qty 1

## 2011-06-18 NOTE — Progress Notes (Addendum)
Patient ID: Krystal Hartman, female   DOB: 08-30-1990, 20 y.o.   MRN: [id number] Krystal Hartman is a 20 y.o. G3P1011 at [redacted]w[redacted]d admitted for [facility] with SI Preeclampsia and elevated LFTs.  Hospital Day No: 9  Subjective: No complaints  Pregnancy complications: chronic HTN, pre-eclampsia  Objective: BP 161/91  Pulse 82  Temp(Src) 97.6 F (36.4 C) (Oral)  Resp 20  Ht 5\' 4"  (1.626 m)  Wt 175.315 kg (386 lb 8 oz)  BMI 66.34 kg/m2  SpO2 99% I/O last 3 completed shifts: In: 2140 [P.O.:2140] Out: 3975 [Urine:3975] Total I/O In: 240 [P.O.:240] Out: 0   Physical Exam:  Gen: alert Chest/Lungs: cta bilaterally  Heart/Pulse: RRR  Abdomen: soft, gravid, nontender, BX x4 quad Uterine fundus: soft, nontender Skin & Color: warm and dry  Neurological: AOx3, DTRs 1+ EXT: negative Homan's b/l, edema 1+  FHT:  FHR: 130s bpm, variability: moderate,  accelerations:  Present,  decelerations:  Absent UC:   none SVE:    deferred  Labs: Lab Results  Component Value Date   WBC 7.2 06/18/2011   HGB 8.9* 06/18/2011   HCT 29.2* 06/18/2011   MCV 78.3 06/18/2011   PLT 275 06/18/2011  BPP 6/8 (minus 2 for breathing) AFI polyhydramnios  Assessment and Plan: has Morbid obesity; History of MRSA infection; Recurrent boils; HSV-2 (herpes simplex virus 2) infection; Polyhydramnios in third trimester; Anemia; and Chronic hypertension with superimposed preeclampsia on her problem list. Currently stable.  Fetal status is reassuring. BPP 8/10. Continue current plan of care.   Krystal Hartman Y 06/18/2011, 11:05 AM

## 2011-06-19 ENCOUNTER — Inpatient Hospital Stay (HOSPITAL_COMMUNITY): Payer: Medicaid Other

## 2011-06-19 LAB — COMPREHENSIVE METABOLIC PANEL
ALT: 74 U/L — ABNORMAL HIGH (ref 0–35)
Albumin: 2.3 g/dL — ABNORMAL LOW (ref 3.5–5.2)
Alkaline Phosphatase: 90 U/L (ref 39–117)
BUN: 10 mg/dL (ref 6–23)
Chloride: 104 mEq/L (ref 96–112)
Glucose, Bld: 83 mg/dL (ref 70–99)
Potassium: 4.3 mEq/L (ref 3.5–5.1)
Sodium: 134 mEq/L — ABNORMAL LOW (ref 135–145)
Total Bilirubin: 0.3 mg/dL (ref 0.3–1.2)

## 2011-06-19 LAB — CBC
HCT: 28.8 % — ABNORMAL LOW (ref 36.0–46.0)
Hemoglobin: 8.9 g/dL — ABNORMAL LOW (ref 12.0–15.0)
RDW: 18.9 % — ABNORMAL HIGH (ref 11.5–15.5)
WBC: 7.3 10*3/uL (ref 4.0–10.5)

## 2011-06-19 NOTE — Progress Notes (Signed)
Patient ID: Krystal Hartman, female   DOB: 25-Feb-1991, 20 y.o.   MRN: [id number]  Hospital day # 9 pregnancy at [redacted]w[redacted]d GHTN vs. Pre-eclampsia  S: well, no complaints, denies ctx, vb, dc, lof, +FM       O: BP 149/68  Pulse 88  Temp(Src) 98.5 F (36.9 C) (Oral)  Resp 18  Ht 5\' 4"  (1.626 m)  Wt 175.315 kg (386 lb 8 oz)  BMI 66.34 kg/m2  SpO2 99% FHR 130 - reactive, no decels CAT I toco - quiet GEN: A&O x3 Lungs: CTAB Heart: RRR Abd: soft, NT, gravid EXT: neg homan's  BPP: 8/8   A: [redacted]w[redacted]d with GHTN  stable  P: continue current plan of care Repeat C/S Wednsday  Malissa Hippo  MD 06/19/2011 9:35 AM

## 2011-06-19 NOTE — Progress Notes (Signed)
Patient ID: Krystal Hartman, female   DOB: 1991/05/12, 20 y.o.   MRN: [id number] Pt without complaints.  No leakage of fluid or VB.  Good FM no cp no SOB  BP 137/71  Pulse 90  Temp(Src) 97.8 F (36.6 C) (Oral)  Resp 18  Ht 5\' 4"  (1.626 m)  Wt 177.447 kg (391 lb 3.2 oz)  BMI 67.15 kg/m2  SpO2 99%  FHTS Baseline: 130 bpm  Toco none  Pt in NAD CV RRR Lungs CTAB abd  Gravid soft and NT GU no vb EXt no calf tenderness Results for orders placed during the hospital encounter of 06/10/11 (from the past 72 hour(s))  CBC     Status: Abnormal   Collection Time   06/17/11  4:59 AM      Component Value Range Comment   WBC 7.2  4.0 - 10.5 (K/uL)    RBC 3.77 (*) 3.87 - 5.11 (MIL/uL)    Hemoglobin 9.1 (*) 12.0 - 15.0 (g/dL)    HCT 95.6 (*) 21.3 - 46.0 (%)    MCV 77.7 (*) 78.0 - 100.0 (fL)    MCH 24.1 (*) 26.0 - 34.0 (pg)    MCHC 31.1  30.0 - 36.0 (g/dL)    RDW 08.6 (*) 57.8 - 15.5 (%)    Platelets 277  150 - 400 (K/uL)   COMPREHENSIVE METABOLIC PANEL     Status: Abnormal   Collection Time   06/17/11  4:59 AM      Component Value Range Comment   Sodium 136  135 - 145 (mEq/L)    Potassium 4.2  3.5 - 5.1 (mEq/L)    Chloride 107  96 - 112 (mEq/L)    CO2 20  19 - 32 (mEq/L)    Glucose, Bld 95  70 - 99 (mg/dL)    BUN 11  6 - 23 (mg/dL)    Creatinine, Ser 4.69  0.50 - 1.10 (mg/dL)    Calcium 8.9  8.4 - 10.5 (mg/dL)    Total Protein 7.0  6.0 - 8.3 (g/dL)    Albumin 2.3 (*) 3.5 - 5.2 (g/dL)    AST 66 (*) 0 - 37 (U/L)    ALT 66 (*) 0 - 35 (U/L)    Alkaline Phosphatase 86  39 - 117 (U/L)    Total Bilirubin 0.2 (*) 0.3 - 1.2 (mg/dL)    GFR calc non Af Amer >90  >90 (mL/min)    GFR calc Af Amer >90  >90 (mL/min)   CBC     Status: Abnormal   Collection Time   06/18/11  5:52 AM      Component Value Range Comment   WBC 7.2  4.0 - 10.5 (K/uL)    RBC 3.73 (*) 3.87 - 5.11 (MIL/uL)    Hemoglobin 8.9 (*) 12.0 - 15.0 (g/dL)    HCT 62.9 (*) 52.8 - 46.0 (%)    MCV 78.3  78.0 - 100.0 (fL)    MCH 23.9 (*) 26.0 - 34.0 (pg)    MCHC 30.5  30.0 - 36.0 (g/dL)    RDW 41.3 (*) 24.4 - 15.5 (%)    Platelets 275  150 - 400 (K/uL)   COMPREHENSIVE METABOLIC PANEL     Status: Abnormal   Collection Time   06/18/11  5:52 AM      Component Value Range Comment   Sodium 136  135 - 145 (mEq/L)    Potassium 4.1  3.5 - 5.1 (mEq/L)    Chloride 107  96 -  112 (mEq/L)    CO2 20  19 - 32 (mEq/L)    Glucose, Bld 85  70 - 99 (mg/dL)    BUN 11  6 - 23 (mg/dL)    Creatinine, Ser 1.61  0.50 - 1.10 (mg/dL)    Calcium 9.0  8.4 - 10.5 (mg/dL)    Total Protein 7.3  6.0 - 8.3 (g/dL)    Albumin 2.3 (*) 3.5 - 5.2 (g/dL)    AST 64 (*) 0 - 37 (U/L)    ALT 65 (*) 0 - 35 (U/L)    Alkaline Phosphatase 81  39 - 117 (U/L)    Total Bilirubin 0.2 (*) 0.3 - 1.2 (mg/dL)    GFR calc non Af Amer >90  >90 (mL/min)    GFR calc Af Amer >90  >90 (mL/min)   URIC ACID     Status: Normal   Collection Time   06/18/11  5:52 AM      Component Value Range Comment   Uric Acid, Serum 4.4  2.4 - 7.0 (mg/dL)   CBC     Status: Abnormal   Collection Time   06/19/11  5:20 AM      Component Value Range Comment   WBC 7.3  4.0 - 10.5 (K/uL)    RBC 3.70 (*) 3.87 - 5.11 (MIL/uL)    Hemoglobin 8.9 (*) 12.0 - 15.0 (g/dL)    HCT 09.6 (*) 04.5 - 46.0 (%)    MCV 77.8 (*) 78.0 - 100.0 (fL)    MCH 24.1 (*) 26.0 - 34.0 (pg)    MCHC 30.9  30.0 - 36.0 (g/dL)    RDW 40.9 (*) 81.1 - 15.5 (%)    Platelets 289  150 - 400 (K/uL)   COMPREHENSIVE METABOLIC PANEL     Status: Abnormal   Collection Time   06/19/11  5:20 AM      Component Value Range Comment   Sodium 134 (*) 135 - 145 (mEq/L)    Potassium 4.3  3.5 - 5.1 (mEq/L)    Chloride 104  96 - 112 (mEq/L)    CO2 22  19 - 32 (mEq/L)    Glucose, Bld 83  70 - 99 (mg/dL)    BUN 10  6 - 23 (mg/dL)    Creatinine, Ser 9.14  0.50 - 1.10 (mg/dL)    Calcium 9.0  8.4 - 10.5 (mg/dL)    Total Protein 6.9  6.0 - 8.3 (g/dL)    Albumin 2.3 (*) 3.5 - 5.2 (g/dL)    AST 72 (*) 0 - 37 (U/L)    ALT 74 (*)  0 - 35 (U/L)    Alkaline Phosphatase 90  39 - 117 (U/L)    Total Bilirubin 0.3  0.3 - 1.2 (mg/dL)    GFR calc non Af Amer >90  >90 (mL/min)    GFR calc Af Amer >90  >90 (mL/min)     Assessment and Plan [redacted]w[redacted]d  Preeclampsia.  Pt stable plan cesarean for 37 weeks unless condition worsens.  Continue current care

## 2011-06-20 DIAGNOSIS — Z9889 Other specified postprocedural states: Secondary | ICD-10-CM

## 2011-06-20 DIAGNOSIS — R112 Nausea with vomiting, unspecified: Secondary | ICD-10-CM

## 2011-06-20 HISTORY — DX: Nausea with vomiting, unspecified: R11.2

## 2011-06-20 HISTORY — DX: Other specified postprocedural states: Z98.890

## 2011-06-20 LAB — COMPREHENSIVE METABOLIC PANEL
AST: 78 U/L — ABNORMAL HIGH (ref 0–37)
Albumin: 2.3 g/dL — ABNORMAL LOW (ref 3.5–5.2)
BUN: 11 mg/dL (ref 6–23)
Calcium: 8.8 mg/dL (ref 8.4–10.5)
Chloride: 103 mEq/L (ref 96–112)
Creatinine, Ser: 0.57 mg/dL (ref 0.50–1.10)
Total Bilirubin: 0.2 mg/dL — ABNORMAL LOW (ref 0.3–1.2)
Total Protein: 7.1 g/dL (ref 6.0–8.3)

## 2011-06-20 LAB — CBC
HCT: 28.7 % — ABNORMAL LOW (ref 36.0–46.0)
MCHC: 31.4 g/dL (ref 30.0–36.0)
MCV: 77.8 fL — ABNORMAL LOW (ref 78.0–100.0)
Platelets: 277 10*3/uL (ref 150–400)
RDW: 18.8 % — ABNORMAL HIGH (ref 11.5–15.5)
WBC: 6.9 10*3/uL (ref 4.0–10.5)

## 2011-06-20 NOTE — Progress Notes (Addendum)
Subjective:  The patient denies headaches, blurred vision, and right upper quadrant tenderness.  Objective:  BP 112/64  Pulse 87  Temp(Src) 98.3 F (36.8 C) (Oral)  Resp 20  Ht 5\' 4"  (1.626 m)  Wt 177.447 kg (391 lb 3.2 oz)  BMI 67.15 kg/m2  SpO2 99%  CBC    Component Value Date/Time   WBC 6.9 06/20/2011 0515   RBC 3.69* 06/20/2011 0515   HGB 9.0* 06/20/2011 0515   HCT 28.7* 06/20/2011 0515   PLT 277 06/20/2011 0515   MCV 77.8* 06/20/2011 0515   MCH 24.4* 06/20/2011 0515   MCHC 31.4 06/20/2011 0515   RDW 18.8* 06/20/2011 0515   LYMPHSABS 2.1 05/27/2011 0514   MONOABS 0.7 05/27/2011 0514   EOSABS 0.1 05/27/2011 0514   BASOSABS 0.0 05/27/2011 0514    CMP     Component Value Date/Time   NA 132* 06/20/2011 0515   K 4.0 06/20/2011 0515   CL 103 06/20/2011 0515   CO2 18* 06/20/2011 0515   GLUCOSE 88 06/20/2011 0515   BUN 11 06/20/2011 0515   CREATININE 0.57 06/20/2011 0515   CREATININE 0.54 11/08/2009 1123   CALCIUM 8.8 06/20/2011 0515   PROT 7.1 06/20/2011 0515   ALBUMIN 2.3* 06/20/2011 0515   AST 78* 06/20/2011 0515   ALT 79* 06/20/2011 0515   ALKPHOS 90 06/20/2011 0515   BILITOT 0.2* 06/20/2011 0515   GFRNONAA >90 06/20/2011 0515   GFRAA >90 06/20/2011 0515    Chest: Clear  Heart: Regular rate and rhythm  Abdomen: Nontender, gravid  Extremities: Obese, questionable edema  Neurologic: Normal reflexes  Nonstress test: Category 1  Assessment:  36 weeks and 6 day gestation  Mild preeclampsia  Chronic hypertension  Obesity  Prior cesarean section  Anemia  Plan:  We will continue to monitor the patient while she is in the hospital. She is scheduled for cesarean delivery on January 3 of 2012.

## 2011-06-20 NOTE — L&D Delivery Note (Signed)
Called to repeat C/S 37 weeks for worsening PIH. Infant cried immediately after delivery and was taken to the radiant warmer and dried. Infant continued to have good respiratory effort with Apgars of 9/9. Void x 1. Infant with no gross physical abnormalities. Infant left with nursery staff to do skin-to-skin with the mother. Routine care per pediatrician.  Jaquelyn Bitter, NNP-BC

## 2011-06-21 LAB — COMPREHENSIVE METABOLIC PANEL
AST: 72 U/L — ABNORMAL HIGH (ref 0–37)
BUN: 10 mg/dL (ref 6–23)
CO2: 20 mEq/L (ref 19–32)
Calcium: 9 mg/dL (ref 8.4–10.5)
Chloride: 105 mEq/L (ref 96–112)
Creatinine, Ser: 0.57 mg/dL (ref 0.50–1.10)
GFR calc Af Amer: >90 mL/min (ref >90)
GFR calc non Af Amer: >90 mL/min (ref >90)
Glucose, Bld: 89 mg/dL (ref 70–99)
Total Bilirubin: 0.2 mg/dL — ABNORMAL LOW (ref 0.3–1.2)

## 2011-06-21 LAB — CBC
HCT: 28.9 % — ABNORMAL LOW (ref 36.0–46.0)
Hemoglobin: 9 g/dL — ABNORMAL LOW (ref 12.0–15.0)
MCH: 24.3 pg — ABNORMAL LOW (ref 26.0–34.0)
MCV: 78.1 fL (ref 78.0–100.0)
Platelets: 273 10*3/uL (ref 150–400)
RBC: 3.7 MIL/uL — ABNORMAL LOW (ref 3.87–5.11)
WBC: 6.4 10*3/uL (ref 4.0–10.5)

## 2011-06-21 MED ORDER — LABETALOL HCL 300 MG PO TABS
300.0000 mg | ORAL_TABLET | Freq: Four times a day (QID) | ORAL | Status: DC
  Administered 2011-06-22 – 2011-06-25 (×12): 300 mg via ORAL
  Filled 2011-06-21 (×18): qty 1

## 2011-06-21 MED ORDER — LABETALOL HCL 300 MG PO TABS
300.0000 mg | ORAL_TABLET | Freq: Four times a day (QID) | ORAL | Status: DC
  Administered 2011-06-21 (×2): 300 mg via ORAL
  Filled 2011-06-21 (×4): qty 1

## 2011-06-21 MED ORDER — CEFAZOLIN SODIUM-DEXTROSE 2-3 GM-% IV SOLR
2.0000 g | Freq: Once | INTRAVENOUS | Status: AC
  Administered 2011-06-22: 2 g via INTRAVENOUS
  Filled 2011-06-21: qty 50

## 2011-06-21 NOTE — Progress Notes (Signed)
UR chart review completed.  

## 2011-06-21 NOTE — Progress Notes (Addendum)
Subjective: Doing well.  Anticipating C/S tomorrow at 9am.  Received dose of IV Labetalol at 6:26 am for elevated BP of 161/96, 191/115, with subsequent improvement in BP.  Denies HA,  Visual symptoms, or epigastric pain.  Denies significant contractions, reports +FM.  Denies any questions regarding upcoming C/S.  Objective: Filed Vitals:   06/21/11 8469 06/21/11 0635 06/21/11 0642 06/21/11 0815  BP: 176/94 168/65 159/84 143/72  Pulse: 82 84 86 86  Temp:    97.8 F (36.6 C)  TempSrc:    Oral  Resp: 20 20 20 20   Height:      Weight:      SpO2:       Results for orders placed during the hospital encounter of 06/10/11 (from the past 24 hour(s))  CBC     Status: Abnormal   Collection Time   06/21/11  5:40 AM      Component Value Range   WBC 6.4  4.0 - 10.5 (K/uL)   RBC 3.70 (*) 3.87 - 5.11 (MIL/uL)   Hemoglobin 9.0 (*) 12.0 - 15.0 (g/dL)   HCT 62.9 (*) 52.8 - 46.0 (%)   MCV 78.1  78.0 - 100.0 (fL)   MCH 24.3 (*) 26.0 - 34.0 (pg)   MCHC 31.1  30.0 - 36.0 (g/dL)   RDW 41.3 (*) 24.4 - 15.5 (%)   Platelets 273  150 - 400 (K/uL)  COMPREHENSIVE METABOLIC PANEL     Status: Abnormal   Collection Time   06/21/11  5:40 AM      Component Value Range   Sodium 135  135 - 145 (mEq/L)   Potassium 4.1  3.5 - 5.1 (mEq/L)   Chloride 105  96 - 112 (mEq/L)   CO2 20  19 - 32 (mEq/L)   Glucose, Bld 89  70 - 99 (mg/dL)   BUN 10  6 - 23 (mg/dL)   Creatinine, Ser 0.10  0.50 - 1.10 (mg/dL)   Calcium 9.0  8.4 - 27.2 (mg/dL)   Total Protein 7.6  6.0 - 8.3 (g/dL)   Albumin 2.4 (*) 3.5 - 5.2 (g/dL)   AST 72 (*) 0 - 37 (U/L)   ALT 80 (*) 0 - 35 (U/L)   Alkaline Phosphatase 95  39 - 117 (U/L)   Total Bilirubin 0.2 (*) 0.3 - 1.2 (mg/dL)   GFR calc non Af Amer >90  >90 (mL/min)   GFR calc Af Amer >90  >90 (mL/min)   Hgb and LFTs stable compared to 06/20/11 labs.  Chest clear Heart RRR without murmur Abd gravid, NT FHR reactive on am tracing, occasional mild variable.   Occasional, mild  contractions.  Assessment: IUP at 37 weeks Chronic hypertension, with mild superimposed pre-eclampsia--on Labetalol 300 mg po TID. Morbid obesity Anemia--on Fe Prior cesarean section, scheduled for repeat 06/22/11 with Dr. Pennie Rushing.  Plan: Continue current care. C/S scheduled 06/22/11--will be NPO after midnight. Routine pre-op orders for Dr. Pennie Rushing. Has CBC and CMP scheduled for daily draw 06/22/11.  Nigel Bridgeman, CNM, [place] 06/22/11 10:05am  Will plan increase in Labetolol dose to 300mg  q 6hrs. Orders for NPO after MN and Hold clot entered for C/S in am.

## 2011-06-22 ENCOUNTER — Encounter (HOSPITAL_COMMUNITY): Payer: Self-pay | Admitting: Anesthesiology

## 2011-06-22 ENCOUNTER — Encounter (HOSPITAL_COMMUNITY): Payer: Self-pay | Admitting: Cardiology

## 2011-06-22 ENCOUNTER — Inpatient Hospital Stay (HOSPITAL_COMMUNITY): Payer: Medicaid Other | Admitting: Anesthesiology

## 2011-06-22 ENCOUNTER — Encounter (HOSPITAL_COMMUNITY)
Admission: AD | Disposition: A | Discharge status: Home / Self Care | Payer: Self-pay | Source: Ambulatory Visit | Attending: Obstetrics and Gynecology

## 2011-06-22 DIAGNOSIS — A4902 Methicillin resistant Staphylococcus aureus infection, unspecified site: Secondary | ICD-10-CM

## 2011-06-22 DIAGNOSIS — L0390 Cellulitis, unspecified: Secondary | ICD-10-CM

## 2011-06-22 DIAGNOSIS — L0291 Cutaneous abscess, unspecified: Secondary | ICD-10-CM

## 2011-06-22 LAB — COMPREHENSIVE METABOLIC PANEL
ALT: 72 U/L — ABNORMAL HIGH (ref 0–35)
AST: 62 U/L — ABNORMAL HIGH (ref 0–37)
Alkaline Phosphatase: 87 U/L (ref 39–117)
CO2: 20 mEq/L (ref 19–32)
Calcium: 8.9 mg/dL (ref 8.4–10.5)
Potassium: 3.8 mEq/L (ref 3.5–5.1)
Sodium: 133 mEq/L — ABNORMAL LOW (ref 135–145)
Total Protein: 7.2 g/dL (ref 6.0–8.3)

## 2011-06-22 LAB — HIV ANTIBODY (ROUTINE TESTING W REFLEX): HIV: NONREACTIVE

## 2011-06-22 LAB — CBC
Hemoglobin: 9 g/dL — ABNORMAL LOW (ref 12.0–15.0)
MCH: 24.8 pg — ABNORMAL LOW (ref 26.0–34.0)
MCHC: 31.8 g/dL (ref 30.0–36.0)
Platelets: 286 10*3/uL (ref 150–400)
RBC: 3.63 MIL/uL — ABNORMAL LOW (ref 3.87–5.11)

## 2011-06-22 SURGERY — Surgical Case
Anesthesia: Regional

## 2011-06-22 MED ORDER — BUPIVACAINE HCL (PF) 0.25 % IJ SOLN
INTRAMUSCULAR | Status: DC | PRN
  Administered 2011-06-22: 30 mL

## 2011-06-22 MED ORDER — ONDANSETRON HCL 4 MG/2ML IJ SOLN
INTRAMUSCULAR | Status: AC
  Filled 2011-06-22: qty 2

## 2011-06-22 MED ORDER — LACTATED RINGERS IV SOLN
INTRAVENOUS | Status: DC | PRN
  Administered 2011-06-22 (×3): via INTRAVENOUS

## 2011-06-22 MED ORDER — DIPHENHYDRAMINE HCL 25 MG PO CAPS: 25.0000 mg | ORAL_CAPSULE | ORAL | Status: DC | PRN

## 2011-06-22 MED ORDER — MAGNESIUM SULFATE 40 G IN LACTATED RINGERS - SIMPLE
2.0000 g/h | INTRAVENOUS | Status: DC
  Administered 2011-06-22 – 2011-06-23 (×2): 2 g/h via INTRAVENOUS
  Filled 2011-06-22 (×2): qty 500

## 2011-06-22 MED ORDER — NALBUPHINE HCL 10 MG/ML IJ SOLN: 5.0000 mg | INTRAMUSCULAR | Status: DC | PRN

## 2011-06-22 MED ORDER — SODIUM BICARBONATE 8.4 % IV SOLN
INTRAVENOUS | Status: AC
  Filled 2011-06-22: qty 50

## 2011-06-22 MED ORDER — MENTHOL 3 MG MT LOZG: 1.0000 | LOZENGE | OROMUCOSAL | Status: DC | PRN

## 2011-06-22 MED ORDER — OXYTOCIN 10 UNIT/ML IJ SOLN
INTRAMUSCULAR | Status: DC | PRN
  Administered 2011-06-22: 20 [IU] via INTRAMUSCULAR

## 2011-06-22 MED ORDER — NALOXONE HCL 0.4 MG/ML IJ SOLN: 0.4000 mg | INTRAMUSCULAR | Status: DC | PRN

## 2011-06-22 MED ORDER — DIPHENHYDRAMINE HCL 50 MG/ML IJ SOLN: 12.5000 mg | INTRAMUSCULAR | Status: DC | PRN

## 2011-06-22 MED ORDER — TETANUS-DIPHTH-ACELL PERTUSSIS 5-2.5-18.5 LF-MCG/0.5 IM SUSP
0.5000 mL | Freq: Once | INTRAMUSCULAR | Status: AC
  Administered 2011-06-23: 0.5 mL via INTRAMUSCULAR
  Filled 2011-06-22: qty 0.5

## 2011-06-22 MED ORDER — ACETAMINOPHEN 325 MG PO TABS: 325.0000 mg | ORAL_TABLET | ORAL | Status: DC | PRN

## 2011-06-22 MED ORDER — CHLOROPROCAINE HCL 3 % IJ SOLN
INTRAMUSCULAR | Status: AC
  Filled 2011-06-22: qty 20

## 2011-06-22 MED ORDER — SODIUM CHLORIDE 0.9 % IJ SOLN: 3.0000 mL | INTRAMUSCULAR | Status: DC | PRN

## 2011-06-22 MED ORDER — SODIUM BICARBONATE 8.4 % IV SOLN
INTRAVENOUS | Status: AC
  Filled 2011-06-22: qty 150

## 2011-06-22 MED ORDER — SODIUM BICARBONATE 8.4 % IV SOLN
INTRAVENOUS | Status: DC | PRN
  Administered 2011-06-22: 5 mL via EPIDURAL

## 2011-06-22 MED ORDER — MEDROXYPROGESTERONE ACETATE 150 MG/ML IM SUSP: 150.0000 mg | INTRAMUSCULAR | Status: DC | PRN

## 2011-06-22 MED ORDER — IBUPROFEN 600 MG PO TABS
600.0000 mg | ORAL_TABLET | Freq: Four times a day (QID) | ORAL | Status: DC
  Administered 2011-06-22 – 2011-06-25 (×11): 600 mg via ORAL
  Filled 2011-06-22 (×7): qty 1

## 2011-06-22 MED ORDER — IBUPROFEN 600 MG PO TABS
600.0000 mg | ORAL_TABLET | Freq: Four times a day (QID) | ORAL | Status: DC | PRN
  Filled 2011-06-22 (×3): qty 1

## 2011-06-22 MED ORDER — SENNOSIDES-DOCUSATE SODIUM 8.6-50 MG PO TABS
2.0000 | ORAL_TABLET | Freq: Every day | ORAL | Status: DC
  Administered 2011-06-22 – 2011-06-24 (×3): 2 via ORAL

## 2011-06-22 MED ORDER — FENTANYL CITRATE 0.05 MG/ML IJ SOLN
INTRAMUSCULAR | Status: AC
  Filled 2011-06-22: qty 2

## 2011-06-22 MED ORDER — KETOROLAC TROMETHAMINE 30 MG/ML IJ SOLN: 15.0000 mg | Freq: Once | INTRAMUSCULAR | Status: DC | PRN

## 2011-06-22 MED ORDER — ONDANSETRON HCL 4 MG/2ML IJ SOLN
4.0000 mg | INTRAMUSCULAR | Status: DC | PRN
  Administered 2011-06-22: 4 mg via INTRAVENOUS
  Filled 2011-06-22: qty 2

## 2011-06-22 MED ORDER — ONDANSETRON HCL 4 MG/2ML IJ SOLN: 4.0000 mg | Freq: Three times a day (TID) | INTRAMUSCULAR | Status: DC | PRN

## 2011-06-22 MED ORDER — ONDANSETRON HCL 4 MG/2ML IJ SOLN
INTRAMUSCULAR | Status: DC | PRN
  Administered 2011-06-22: 4 mg via INTRAVENOUS

## 2011-06-22 MED ORDER — DIPHENHYDRAMINE HCL 50 MG/ML IJ SOLN: 25.0000 mg | INTRAMUSCULAR | Status: DC | PRN

## 2011-06-22 MED ORDER — KETOROLAC TROMETHAMINE 30 MG/ML IJ SOLN: 30.0000 mg | Freq: Four times a day (QID) | INTRAMUSCULAR | Status: AC | PRN

## 2011-06-22 MED ORDER — VANCOMYCIN HCL 1000 MG IV SOLR
1500.0000 mg | Freq: Once | INTRAVENOUS | Status: AC
  Administered 2011-06-22: 1500 mg via INTRAVENOUS
  Filled 2011-06-22: qty 1500

## 2011-06-22 MED ORDER — ACETAMINOPHEN 10 MG/ML IV SOLN: 1000.0000 mg | Freq: Four times a day (QID) | INTRAVENOUS | Status: AC | PRN

## 2011-06-22 MED ORDER — PRENATAL PLUS 27-1 MG PO TABS: 1.0000 | ORAL_TABLET | Freq: Every day | ORAL | Status: DC

## 2011-06-22 MED ORDER — ONDANSETRON HCL 4 MG PO TABS: 4.0000 mg | ORAL_TABLET | ORAL | Status: DC | PRN

## 2011-06-22 MED ORDER — LIDOCAINE-EPINEPHRINE (PF) 2 %-1:200000 IJ SOLN
INTRAMUSCULAR | Status: AC
  Filled 2011-06-22: qty 20

## 2011-06-22 MED ORDER — MORPHINE SULFATE (PF) 0.5 MG/ML IJ SOLN
INTRAMUSCULAR | Status: DC | PRN
  Administered 2011-06-22: 4 mg via EPIDURAL

## 2011-06-22 MED ORDER — MAGNESIUM SULFATE 40 MG/ML IJ SOLN
4.0000 g | Freq: Once | INTRAMUSCULAR | Status: DC
  Filled 2011-06-22: qty 100

## 2011-06-22 MED ORDER — OXYCODONE-ACETAMINOPHEN 5-325 MG PO TABS
1.0000 | ORAL_TABLET | ORAL | Status: DC | PRN
  Administered 2011-06-24 – 2011-06-25 (×3): 1 via ORAL
  Administered 2011-06-25: 2 via ORAL
  Filled 2011-06-22: qty 2
  Filled 2011-06-22 (×3): qty 1

## 2011-06-22 MED ORDER — CLINDAMYCIN HCL 300 MG PO CAPS
300.0000 mg | ORAL_CAPSULE | Freq: Four times a day (QID) | ORAL | Status: DC
  Administered 2011-06-22 – 2011-06-25 (×11): 300 mg via ORAL
  Filled 2011-06-22 (×15): qty 1

## 2011-06-22 MED ORDER — FENTANYL CITRATE 0.05 MG/ML IJ SOLN: 25.0000 ug | INTRAMUSCULAR | Status: DC | PRN

## 2011-06-22 MED ORDER — SIMETHICONE 80 MG PO CHEW
80.0000 mg | CHEWABLE_TABLET | Freq: Three times a day (TID) | ORAL | Status: DC
  Administered 2011-06-22 – 2011-06-24 (×8): 80 mg via ORAL

## 2011-06-22 MED ORDER — SCOPOLAMINE 1 MG/3DAYS TD PT72: 1.0000 | MEDICATED_PATCH | Freq: Once | TRANSDERMAL | Status: DC

## 2011-06-22 MED ORDER — DIBUCAINE 1 % RE OINT: 1.0000 "application " | TOPICAL_OINTMENT | RECTAL | Status: DC | PRN

## 2011-06-22 MED ORDER — PROMETHAZINE HCL 25 MG/ML IJ SOLN: 6.2500 mg | INTRAMUSCULAR | Status: DC | PRN

## 2011-06-22 MED ORDER — OXYTOCIN 20 UNITS IN LACTATED RINGERS INFUSION - SIMPLE
125.0000 mL/h | INTRAVENOUS | Status: AC
  Administered 2011-06-23: 125 mL/h via INTRAVENOUS
  Filled 2011-06-22: qty 1000

## 2011-06-22 MED ORDER — MAGNESIUM SULFATE 40 G IN LACTATED RINGERS - SIMPLE
2.0000 g/h | INTRAVENOUS | Status: DC
  Filled 2011-06-22: qty 500

## 2011-06-22 MED ORDER — MORPHINE SULFATE 0.5 MG/ML IJ SOLN
INTRAMUSCULAR | Status: AC
  Filled 2011-06-22: qty 10

## 2011-06-22 MED ORDER — LACTATED RINGERS IV SOLN
INTRAVENOUS | Status: DC
  Administered 2011-06-23: 10:00:00 via INTRAVENOUS

## 2011-06-22 MED ORDER — DIPHENHYDRAMINE HCL 25 MG PO CAPS: 25.0000 mg | ORAL_CAPSULE | Freq: Four times a day (QID) | ORAL | Status: DC | PRN

## 2011-06-22 MED ORDER — LIDOCAINE-EPINEPHRINE (PF) 2 %-1:200000 IJ SOLN
INTRAMUSCULAR | Status: AC
  Filled 2011-06-22: qty 40

## 2011-06-22 MED ORDER — WITCH HAZEL-GLYCERIN EX PADS: 1.0000 "application " | MEDICATED_PAD | CUTANEOUS | Status: DC | PRN

## 2011-06-22 MED ORDER — METOCLOPRAMIDE HCL 5 MG/ML IJ SOLN: 10.0000 mg | Freq: Three times a day (TID) | INTRAMUSCULAR | Status: DC | PRN

## 2011-06-22 MED ORDER — ZOLPIDEM TARTRATE 5 MG PO TABS: 5.0000 mg | ORAL_TABLET | Freq: Every evening | ORAL | Status: DC | PRN

## 2011-06-22 MED ORDER — LANOLIN HYDROUS EX OINT: 1.0000 "application " | TOPICAL_OINTMENT | CUTANEOUS | Status: DC | PRN

## 2011-06-22 MED ORDER — CITRIC ACID-SODIUM CITRATE 334-500 MG/5ML PO SOLN
ORAL | Status: AC
  Administered 2011-06-22: 30 mL
  Filled 2011-06-22: qty 15

## 2011-06-22 MED ORDER — MORPHINE SULFATE (PF) 0.5 MG/ML IJ SOLN
INTRAMUSCULAR | Status: DC | PRN
  Administered 2011-06-22: 1 mg via INTRAVENOUS

## 2011-06-22 MED ORDER — MAGNESIUM SULFATE BOLUS VIA INFUSION
4.0000 g | Freq: Once | INTRAVENOUS | Status: AC
  Administered 2011-06-22: 4 g via INTRAVENOUS
  Filled 2011-06-22: qty 500

## 2011-06-22 MED ORDER — SIMETHICONE 80 MG PO CHEW: 80.0000 mg | CHEWABLE_TABLET | ORAL | Status: DC | PRN

## 2011-06-22 MED ORDER — OXYTOCIN 10 UNIT/ML IJ SOLN
INTRAMUSCULAR | Status: AC
  Filled 2011-06-22: qty 2

## 2011-06-22 MED ORDER — SODIUM CHLORIDE 0.9 % IV SOLN: 1.0000 ug/kg/h | INTRAVENOUS | Status: DC | PRN

## 2011-06-22 MED ORDER — PRENATAL MULTIVITAMIN CH
1.0000 | ORAL_TABLET | Freq: Every day | ORAL | Status: DC
  Administered 2011-06-23 – 2011-06-24 (×2): 1 via ORAL
  Filled 2011-06-22 (×2): qty 1

## 2011-06-22 SURGICAL SUPPLY — 49 items
ADH SKN CLS APL DERMABOND .7 (GAUZE/BANDAGES/DRESSINGS) ×1
APL SKNCLS STERI-STRIP NONHPOA (GAUZE/BANDAGES/DRESSINGS)
BENZOIN TINCTURE PRP APPL 2/3 (GAUZE/BANDAGES/DRESSINGS) IMPLANT
BLADE EXTENDED COATED 6.5IN (ELECTRODE) IMPLANT
BLADE HEX COATED 2.75 (ELECTRODE) IMPLANT
BOOTIES KNEE HIGH SLOAN (MISCELLANEOUS) ×4 IMPLANT
CHLORAPREP W/TINT 26ML (MISCELLANEOUS) ×2 IMPLANT
CLOTH BEACON ORANGE TIMEOUT ST (SAFETY) ×2 IMPLANT
CONTAINER PREFILL 10% NBF 15ML (MISCELLANEOUS) ×4 IMPLANT
DERMABOND ADVANCED (GAUZE/BANDAGES/DRESSINGS) ×1
DERMABOND ADVANCED .7 DNX12 (GAUZE/BANDAGES/DRESSINGS) IMPLANT
DRAIN JACKSON PRT FLT 7MM (DRAIN) IMPLANT
DRESSING TELFA 8X3 (GAUZE/BANDAGES/DRESSINGS) IMPLANT
ELECT REM PT RETURN 9FT ADLT (ELECTROSURGICAL) ×2
ELECTRODE REM PT RTRN 9FT ADLT (ELECTROSURGICAL) ×1 IMPLANT
EVACUATOR SILICONE 100CC (DRAIN) IMPLANT
EXTRACTOR VACUUM KIWI (MISCELLANEOUS) IMPLANT
EXTRACTOR VACUUM M CUP 4 TUBE (SUCTIONS) IMPLANT
GAUZE SPONGE 4X4 12PLY STRL LF (GAUZE/BANDAGES/DRESSINGS) ×4 IMPLANT
GLOVE SURG SS PI 6.0 STRL IVOR (GLOVE) ×3 IMPLANT
GLOVE SURG SS PI 6.5 STRL IVOR (GLOVE) ×7 IMPLANT
GLOVE SURG SS PI 7.5 STRL IVOR (GLOVE) ×4 IMPLANT
GOWN PREVENTION PLUS LG XLONG (DISPOSABLE) ×6 IMPLANT
GOWN STRL REIN XL XLG (GOWN DISPOSABLE) ×7 IMPLANT
KIT ABG SYR 3ML LUER SLIP (SYRINGE) IMPLANT
NDL HYPO 25X5/8 SAFETYGLIDE (NEEDLE) ×1 IMPLANT
NDL SPNL 22GX3.5 QUINCKE BK (NEEDLE) ×1 IMPLANT
NEEDLE HYPO 25X5/8 SAFETYGLIDE (NEEDLE) ×2 IMPLANT
NEEDLE SPNL 22GX3.5 QUINCKE BK (NEEDLE) ×2 IMPLANT
NS IRRIG 1000ML POUR BTL (IV SOLUTION) ×2 IMPLANT
PACK C SECTION WH (CUSTOM PROCEDURE TRAY) ×2 IMPLANT
PAD ABD 7.5X8 STRL (GAUZE/BANDAGES/DRESSINGS) IMPLANT
SLEEVE SCD COMPRESS KNEE MED (MISCELLANEOUS) IMPLANT
STRIP CLOSURE SKIN 1/4X4 (GAUZE/BANDAGES/DRESSINGS) IMPLANT
SUT CHROMIC 2 0 SH (SUTURE) ×2 IMPLANT
SUT MNCRL AB 3-0 PS2 27 (SUTURE) ×2 IMPLANT
SUT SILK 0 FSL (SUTURE) IMPLANT
SUT VIC AB 0 CT1 27 (SUTURE) ×4
SUT VIC AB 0 CT1 27XBRD ANBCTR (SUTURE) ×2 IMPLANT
SUT VIC AB 0 CT1 36 (SUTURE) IMPLANT
SUT VIC AB 0 CTXB 36 (SUTURE) IMPLANT
SUT VIC AB 2-0 CT1 27 (SUTURE) ×4
SUT VIC AB 2-0 CT1 TAPERPNT 27 (SUTURE) ×2 IMPLANT
SUT VIC AB 2-0 SH 27 (SUTURE)
SUT VIC AB 2-0 SH 27XBRD (SUTURE) IMPLANT
SYR CONTROL 10ML LL (SYRINGE) ×2 IMPLANT
TOWEL OR 17X24 6PK STRL BLUE (TOWEL DISPOSABLE) ×4 IMPLANT
TRAY FOLEY CATH 14FR (SET/KITS/TRAYS/PACK) ×2 IMPLANT
WATER STERILE IRR 1000ML POUR (IV SOLUTION) ×2 IMPLANT

## 2011-06-22 NOTE — Consult Note (Signed)
Internal Medicine Teaching Service Attending Note Date: 06/22/2011  Patient name: Krystal Hartman  Medical record number: [id number]  Date of birth: Sep 11, 1990   Reason for Consult: MRSA infection  Consulting physician: Dr. Pennie Rushing  HPI: This is a 21 yo female with a history of reported draining lesions over the last five years that came in for repeat C/S complicated by [facility].  She is currently found in the ICU due to the Mg drip.  SHe tells me she has had rather regular draining lesions for 5 years that develop into pus collections and often spontaneously drain.  She is frequently on antibiotics due to these lesions that generally spontaneously resolve.  She also reports that she has had cultures of these in the past but has always been negative.  Her only positive culture to her knowledge has been the nasal screen.  She generally does not have systemic symptoms including no fever or chills.  Today, she does have active lesions including one near the incision site.  No history of immunosuppression.   Physical Exam: Blood pressure 157/82, pulse 81, temperature 97.8 F (36.6 C), temperature source Oral, resp. rate 16, height 5\' 5"  (1.651 m), weight 376 lb 11.2 oz (170.87 kg), SpO2 98.00%, unknown if currently breastfeeding. BP 157/82  Pulse 81  Temp(Src) 97.8 F (36.6 C) (Oral)  Resp 16  Ht 5\' 5"  (1.651 m)  Wt 376 lb 11.2 oz (170.87 kg)  BMI 62.69 kg/m2  SpO2 98%  Breastfeeding? Unknown General appearance: alert, cooperative, no distress and morbidly obese Lungs: clear to auscultation bilaterally Heart: regular rate and rhythm, S1, S2 normal, no murmur, click, rub or gallop Abdomen: soft, non-tender; bowel sounds normal; no masses,  no organomegaly Skin: + lesion on left inguinal area, about 3-4 mm.   Lymph nodes: Cervical, supraclavicular, and axillary nodes normal.  Lab results: Results for orders placed during the hospital encounter of 06/10/11 (from the past 24 hour(s))  HIV  ANTIBODY (ROUTINE TESTING)     Status: Normal      Component Value Range   HIV Non-reactive    CBC     Status: Abnormal   Collection Time   06/22/11  5:20 AM      Component Value Range   WBC 6.4  4.0 - 10.5 (K/uL)   RBC 3.63 (*) 3.87 - 5.11 (MIL/uL)   Hemoglobin 9.0 (*) 12.0 - 15.0 (g/dL)   HCT 40.9 (*) 81.1 - 46.0 (%)   MCV 78.0  78.0 - 100.0 (fL)   MCH 24.8 (*) 26.0 - 34.0 (pg)   MCHC 31.8  30.0 - 36.0 (g/dL)   RDW 91.4 (*) 78.2 - 15.5 (%)   Platelets 286  150 - 400 (K/uL)  COMPREHENSIVE METABOLIC PANEL     Status: Abnormal   Collection Time   06/22/11  5:20 AM      Component Value Range   Sodium 133 (*) 135 - 145 (mEq/L)   Potassium 3.8  3.5 - 5.1 (mEq/L)   Chloride 106  96 - 112 (mEq/L)   CO2 20  19 - 32 (mEq/L)   Glucose, Bld 90  70 - 99 (mg/dL)   BUN 10  6 - 23 (mg/dL)   Creatinine, Ser 9.56  0.50 - 1.10 (mg/dL)   Calcium 8.9  8.4 - 21.3 (mg/dL)   Total Protein 7.2  6.0 - 8.3 (g/dL)   Albumin 2.4 (*) 3.5 - 5.2 (g/dL)   AST 62 (*) 0 - 37 (U/L)   ALT 72 (*)  0 - 35 (U/L)   Alkaline Phosphatase 87  39 - 117 (U/L)   Total Bilirubin 0.1 (*) 0.3 - 1.2 (mg/dL)   GFR calc non Af Amer >90  >90 (mL/min)   GFR calc Af Amer >90  >90 (mL/min)  SAMPLE TO BLOOD BANK     Status: Normal   Collection Time   06/22/11  5:20 AM      Component Value Range   Blood Bank Specimen SAMPLE AVAILABLE FOR TESTING     Sample Expiration 06/25/2011      Imaging results:  No results found.  Assessment and Plan:   1) perioperative lesion.  By history, I am not sure if she has recurrent MRSA lesions or if it is something similar such as hydratinitis supparativa.  Typically, HS lesions are isolated to the groin or axilla and not seen in other areas.  With repeat negative cultures in the past though, this suggests HS.  However, at this time it is difficult to determine if it is recurrent MRSA or HS without verification of the cultures and further clinical follow up.  With this uncertainty and recent  surgery, I think empiric treatment for MRSA would be optimal.  Perioperatively, vancomycin is indicated and so I will give her one dose now and then treat her with po clindamycin for another 7 days.  I have also talked to her about decolonization with bleach after she has terminated breastfeeding her infant (in 6-12 months or so).  At that time, I have explained to her that she can fill a bathtub with water half way and use one capful of chlorine bleach and soak for 15-20 minutes and repeat that weekly x 3.

## 2011-06-22 NOTE — Transfer of Care (Signed)
Immediate Anesthesia Transfer of Care Note  Patient: Krystal Hartman  Procedure(s) Performed:  CESAREAN SECTION - MRSA positive, Latex Allergy  Patient Location: PACU  Anesthesia Type: Epidural  Level of Consciousness: awake, alert  and oriented  Airway & Oxygen Therapy: Patient Spontanous Breathing  Post-op Assessment: Report given to PACU RN and Post -op Vital signs reviewed and stable  Post vital signs: Reviewed and stable  Complications: No apparent anesthesia complications

## 2011-06-22 NOTE — Progress Notes (Addendum)
Krystal Hartman is a 21 y.o. G3P1011 at [redacted]w[redacted]d by LMP admitted for chronic hypertension with superimposed pre=eclamplsia.  Subjective: GI: negative GU: negative OB: Good fetal movement        Objective: BP 138/69  Pulse 80  Temp(Src) 98.2 F (36.8 C) (Oral)  Resp 20  Ht 5\' 4"  (1.626 m)  Wt 395 lb 6.4 oz (179.352 kg)  BMI 67.87 kg/m2  SpO2 99% I/O last 3 completed shifts: In: 1800 [P.O.:1800] Out: 3800 [Urine:3800]  Abdomen:  Morbidly obese with large pannus.  Previous incision without erythema, induraion or drainage.                   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  Meris Reede P 06/22/2011, 8:34 AM  Pre-op discussion with patient outlining indications for her repeat CS as well as the inherent risks of anesthesia, infection, bleeding and damage to adjacent organs.  I also reviewed the added risks associated with her morbid obesity, chronic hypertension and underlying MRSA history.  The patient acknowledges understanding and consents to proceed.

## 2011-06-22 NOTE — Anesthesia Postprocedure Evaluation (Signed)
Anesthesia Post Note  Patient: Krystal Hartman  Procedure(s) Performed:  CESAREAN SECTION - MRSA positive, Latex Allergy  Anesthesia type: Spinal  Patient location: PACU  Post pain: Pain level controlled  Post assessment: Post-op Vital signs reviewed  Last Vitals:  Filed Vitals:   06/22/11 1300  BP: 151/96  Pulse: 80  Temp: 36.8 C  Resp: 20    Post vital signs: Reviewed  Level of consciousness: awake  Complications: No apparent anesthesia complications

## 2011-06-22 NOTE — Anesthesia Procedure Notes (Addendum)
Epidural Patient location during procedure: OB Start time: 06/22/2011 10:03 AM  Staffing Performed by: anesthesiologist   Preanesthetic Checklist Completed: patient identified, site marked, surgical consent, pre-op evaluation, timeout performed, IV checked, risks and benefits discussed and monitors and equipment checked  Epidural Patient position: sitting Prep: site prepped and draped and DuraPrep Patient monitoring: continuous pulse ox and blood pressure Approach: midline Injection technique: LOR air  Needle:  Needle type: Tuohy  Needle gauge: 17 G Needle length: 9 cm Catheter type: closed end flexible Catheter size: 19 Gauge Test dose: negative  Assessment Events: blood not aspirated, injection not painful, no injection resistance, negative IV test and no paresthesia  Additional Notes After two previous epidural placements which resulted in incomplete sensory block, I positioned patient sitting and made an attempt at epidural anesthesia.  Skin prep with duraprep, 1% lido to skin at L4-5, LOR to air with 9 cm tuohy at 9-10 cm, flushed easily with saline, catheter threaded easily and secured at 15 cm at skin.  Test dose given via catheter was negative for IV or SAB symptoms.  Patient returned to supine and dosed to total of 10 ml of 2%lido with epi and bicarb.  Sensory level T6 bilaterally.  No apparent complications, patient tolerated procedure well.  Jasmine December, MD

## 2011-06-22 NOTE — Progress Notes (Signed)
Anesthesia into room and talked with pt about what to expect, and about the spinal anesthesia.

## 2011-06-22 NOTE — Progress Notes (Signed)
Epidural catheter removed per orders Dr. Jean Rosenthal.  Catheter intact with tip  Visualized.  Site wnl.

## 2011-06-22 NOTE — Progress Notes (Signed)
Pt to bed and placed on the monitor.  CHG wipes completed.  Weeping wounds x 2 in the crease of her left thigh/abdoman covered with gauze and occlusive clear bandage.

## 2011-06-22 NOTE — Anesthesia Preprocedure Evaluation (Addendum)
Anesthesia Evaluation  Patient identified by MRN, date of birth, ID band Patient awake    Reviewed: Allergy & Precautions, H&P , NPO status , Patient's Chart, lab work & pertinent test results  Airway Mallampati: IV      Dental No notable dental hx.    Pulmonary neg pulmonary ROS,  clear to auscultation  Pulmonary exam normal       Cardiovascular Exercise Tolerance: Good hypertension, neg cardio ROS regular Normal    Neuro/Psych Negative Neurological ROS  Negative Psych ROS   GI/Hepatic negative GI ROS, Neg liver ROS,   Endo/Other  Negative Endocrine ROSMorbid obesity  Renal/GU negative Renal ROS  Genitourinary negative   Musculoskeletal   Abdominal Normal abdominal exam  (+)   Peds  Hematology negative hematology ROS (+)   Anesthesia Other Findings Weeping MRSA faruncles  ? Superimposed pre-eclampsia?  Reproductive/Obstetrics (+) Pregnancy                           Anesthesia Physical Anesthesia Plan  ASA: III  Anesthesia Plan: Spinal   Post-op Pain Management:    Induction:   Airway Management Planned:   Additional Equipment:   Intra-op Plan:   Post-operative Plan:   Informed Consent: I have reviewed the patients History and Physical, chart, labs and discussed the procedure including the risks, benefits and alternatives for the proposed anesthesia with the patient or authorized representative who has indicated his/her understanding and acceptance.     Plan Discussed with: Anesthesiologist, CRNA and Surgeon  Anesthesia Plan Comments:         Anesthesia Quick Evaluation

## 2011-06-22 NOTE — Progress Notes (Signed)
MD in to talk to pt about procedure, magnesium sulfate after c/s. Culture obtained by MD from the wound on pt inner left thigh area.

## 2011-06-23 LAB — CBC
HCT: 25 % — ABNORMAL LOW (ref 36.0–46.0)
MCHC: 32 g/dL (ref 30.0–36.0)
MCV: 77.2 fL — ABNORMAL LOW (ref 78.0–100.0)
Platelets: 257 10*3/uL (ref 150–400)
RDW: 18.6 % — ABNORMAL HIGH (ref 11.5–15.5)
WBC: 7.9 10*3/uL (ref 4.0–10.5)

## 2011-06-23 LAB — COMPREHENSIVE METABOLIC PANEL
AST: 76 U/L — ABNORMAL HIGH (ref 0–37)
Albumin: 2.1 g/dL — ABNORMAL LOW (ref 3.5–5.2)
BUN: 6 mg/dL (ref 6–23)
Calcium: 8.2 mg/dL — ABNORMAL LOW (ref 8.4–10.5)
Chloride: 99 mEq/L (ref 96–112)
Creatinine, Ser: 0.63 mg/dL (ref 0.50–1.10)
Total Bilirubin: 0.4 mg/dL (ref 0.3–1.2)
Total Protein: 6.2 g/dL (ref 6.0–8.3)

## 2011-06-23 MED ORDER — FERROUS SULFATE 300 (60 FE) MG/5ML PO SYRP
300.0000 mg | ORAL_SOLUTION | Freq: Two times a day (BID) | ORAL | Status: DC
  Administered 2011-06-23 – 2011-06-25 (×4): 300 mg via ORAL
  Filled 2011-06-23 (×6): qty 5

## 2011-06-23 MED ORDER — CHLORHEXIDINE GLUCONATE CLOTH 2 % EX PADS: 6.0000 | MEDICATED_PAD | Freq: Every day | CUTANEOUS | Status: DC

## 2011-06-23 MED ORDER — FERROUS SULFATE 220 (44 FE) MG/5ML PO ELIX
220.0000 mg | ORAL_SOLUTION | Freq: Two times a day (BID) | ORAL | Status: DC
  Filled 2011-06-23 (×2): qty 5

## 2011-06-23 MED ORDER — MUPIROCIN 2 % EX OINT
1.0000 "application " | TOPICAL_OINTMENT | Freq: Two times a day (BID) | CUTANEOUS | Status: DC
  Filled 2011-06-23: qty 22

## 2011-06-23 MED ORDER — VALACYCLOVIR HCL 500 MG PO TABS
500.0000 mg | ORAL_TABLET | Freq: Two times a day (BID) | ORAL | Status: DC
  Administered 2011-06-23 – 2011-06-25 (×5): 500 mg via ORAL
  Filled 2011-06-23 (×7): qty 1

## 2011-06-23 NOTE — Progress Notes (Signed)
Subjective: Postpartum Day #1: Cesarean Delivery Patient reports No dizziness with ambulation.  Tolerating regular diet..    Objective: Vital signs in last 24 hours: Temp:  [97.8 F (36.6 C)-98.5 F (36.9 C)] 97.9 F (36.6 C) (01/04 1205) Pulse Rate:  [65-117] 81  (01/04 0700) Resp:  [16-22] 18  (01/04 1000) BP: (118-170)/(51-97) 126/63 mmHg (01/04 1000) SpO2:  [95 %-98 %] 96 % (01/04 0700) Weight:  [376 lb 11.2 oz (170.87 kg)-385 lb 9.6 oz (174.907 kg)] 385 lb 9.6 oz (174.907 kg) (01/04 0500)  Physical Exam:  General: Alert,  Morbidly obese appearing older than stated age. Lochia: appropriate Uterine Fundus: Full evaluation limited by body habitus. Incision: healing well, no significant drainage, no dehiscence, no significant erythema  Draining abscesses stable without erythema DVT Evaluation: No evidence of DVT seen on physical exam.   Basename 06/22/11 0520 06/21/11 0540  HGB 9.0* 9.0*  HCT 28.3* 28.9*    Assessment/Plan: Status post Cesarean section. Doing well postoperatively. 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  Has not yet started significant diuresis Continue current care with Magnesium sulfate for 6 additional hours then reassess for diuresis. Continue labetolol Recheck PIH labs.  Urijah Arko P 06/23/2011, 1:00 PM

## 2011-06-23 NOTE — Progress Notes (Signed)
UR chart review completed.  

## 2011-06-23 NOTE — Consult Note (Signed)
WOC consult Note Reason for Consult: req for wound care eval for patient with chronic hidradenitis supperativa versus MRSA infection.  In my opinion the areas of concern appear to be chronic HD based on presentation, drainage and location.  Pt reports these areas are recurrent over the last 5 years and are always in the same location.  Wound type: suspicious for chronic HD  Measurement: 2 open lesions of left groin, both aprox 0.5cm in diameter  Wound ION:GEXB  Drainage (amount, consistency, odor) yellow minimal amount on previous dressing, no odor noted at time of my assessment   Periwound: intact with scarring in bil groins from previous lesions  Dressing procedure/placement/frequency: Placed Interdry Ag in both groins for moisture wicking and tx of area with silver antimicrobial properties.  I have explained to pt that even though no current open lesions in R groin we would place product in this area as well because she does have a couple of suspicious areas (hardened but not open), hopefully this will prevent these areas from opening.  Discussed tx with this product with beside nurse and pt, this product should be left in up to 7 days, can be removed for bathing but then replaced into skin folds.  If material is washed out the silver will become ineffective so would be better to just hang over chair during bath if needed.  I have also provided pt with additional pc to take home at dc to use for left groin that has the open areas.    Bedside nurse to pass along in report need for dressing material to be kept in place up to 7 days and not to be discarded. Should additional product be needed please contact WOC nurse for additional order.  Re consult if needed, will not follow at this time. Thanks  Lavance [employer], CWOCN [phone])

## 2011-06-23 NOTE — Addendum Note (Signed)
Addendum  created 06/23/11 0809 by Truitt Leep, CRNA   Modules edited:Notes Section

## 2011-06-23 NOTE — Anesthesia Postprocedure Evaluation (Signed)
  Anesthesia Post-op Note  Patient: Krystal Hartman  Procedure(s) Performed:  CESAREAN SECTION - MRSA positive, Latex Allergy  Patient Location: PACU and ICU  Anesthesia Type: Epidural  Level of Consciousness: awake, alert , oriented and responds to stimulation  Airway and Oxygen Therapy: Patient Spontanous Breathing  Post-op Pain: mild  Post-op Assessment: Post-op Vital signs reviewed, Patient's Cardiovascular Status Stable and Respiratory Function Stable  Post-op Vital Signs: stable  Complications: No apparent anesthesia complications

## 2011-06-24 LAB — WOUND CULTURE: Special Requests: NORMAL

## 2011-06-24 NOTE — Progress Notes (Addendum)
06/24/11 1230 SBar  Telephone report to Lorretta Harp, RN for pt transfer to [facility] rm #[id number].            1325 Pt tranferred ambulatory to rm [id number]

## 2011-06-24 NOTE — Progress Notes (Signed)
Subjective: Postpartum Day 2: Cesarean Delivery Patient reports + flatus and no problems voiding.    Objective: Vital signs in last 24 hours: Temp:  [97.5 F (36.4 C)-98.2 F (36.8 C)] 98.2 F (36.8 C) (01/05 0736) Pulse Rate:  [87-105] 99  (01/05 1014) Resp:  [16-22] 20  (01/05 1014) BP: (121-154)/(45-70) 127/45 mmHg (01/05 0736) SpO2:  [97 %-99 %] 99 % (01/05 1014) Weight:  [379 lb (171.913 kg)] 379 lb (171.913 kg) (01/05 0736)  Physical Exam:  General: alert Lochia: appropriate Uterine Fundus: difficult to assess secondary to habitus Incision: healing well, no significant drainage, no dehiscence, no significant erythema, Adjacent draining areas without erythema. DVT Evaluation: No evidence of DVT seen on physical exam.   Basename 06/23/11 1255 06/22/11 0520  HGB 8.0* 9.0*  HCT 25.0* 28.3*    Assessment/Plan: Status post Cesarean section. Doing well postoperatively.  Chronic hypertension controlled on current labetalol regimen. Incision without current evidence of infection  Plan: Transfer from ICU Continue labetalol Continue wound care per wound care team  HAYGOOD,VANESSA P 06/24/2011, 11:44 AM

## 2011-06-25 DIAGNOSIS — O909 Complication of the puerperium, unspecified: Secondary | ICD-10-CM

## 2011-06-25 HISTORY — DX: Complication of the puerperium, unspecified: O90.9

## 2011-06-25 MED ORDER — CLINDAMYCIN HCL 300 MG PO CAPS: 300.0000 mg | ORAL_CAPSULE | Freq: Four times a day (QID) | ORAL | Status: AC

## 2011-06-25 MED ORDER — FERROUS SULFATE 300 (60 FE) MG/5ML PO SYRP: 300.0000 mg | ORAL_SOLUTION | Freq: Two times a day (BID) | ORAL | Status: DC

## 2011-06-25 MED ORDER — OXYCODONE-ACETAMINOPHEN 5-325 MG PO TABS: 1.0000 | ORAL_TABLET | ORAL | Status: AC | PRN

## 2011-06-25 MED ORDER — IBUPROFEN 600 MG PO TABS: 600.0000 mg | ORAL_TABLET | Freq: Four times a day (QID) | ORAL | Status: AC | PRN

## 2011-06-25 MED ORDER — LABETALOL HCL 300 MG PO TABS: 300.0000 mg | ORAL_TABLET | Freq: Four times a day (QID) | ORAL | Status: DC

## 2011-06-25 NOTE — Discharge Summary (Signed)
Physician Discharge Summary  Patient ID: Krystal Hartman MRN: [id number] DOB/AGE: 10/22/1990 21 y.o.  Admit date: 06/10/2011 Discharge date: 06/25/2011  Admission Diagnoses: 35.3 week IUP CHTN w/superimposed PreEclampsia Morbid obesity polyhydraminos 3rd trimester Recent MRSA tx Hx C/S Persistant elevatedf LFTs anemia           Discharge Diagnoses:  Repeat Cesarean Section MRSA vs Hydratinitis Supparativa treated Clindamycin, vancomycin Anemia  Principal Problem:  *Chronic hypertension with superimposed preeclampsia Active Problems:  Cesarean section wound complication   Discharged Condition: stable  Hospital Course:  admission at 35.3 weeks see admission diagnosis list above, repeat cesarean section 37 weeks, Magnesium post op,  tx by Dr. Staci Righter with ID vancomycin, clindamycin, and Interdy Ag+ x 7 days, home on labetalol 300 mg qid, clindamycin, iron, pnv, percocet, ibuprofen, and interdy Ag+.   Significant Diagnostic Studies: labs: ultrasounds  Treatments: cardiac meds: labetalol 300 mg qid Subjective: Denies headaches, visual spots or blurring, no swelling, breastfeeding well, pain relief with po medication, can explain use of Interdy Ag+ for 7 day total only. Discharge Exam: Blood pressure 149/102, pulse 90, temperature 98.3 F (36.8 C), temperature source Oral, resp. rate 22, height 5\' 5"  (1.651 m), weight 374 lb 12.8 oz (170.008 kg), SpO2 98.00%, unknown if currently breastfeeding. General appearance: alert, cooperative and no distress Lungs clear bilaterally, AP regular, abd soft, nt, bowel sounds active, ff small serosa flow. R thigh abcess healing without redness, heat, or drainage, on L thigh/groin 1/4 cm area with yellow drainage sm amount, without redness, or warmth,Interdy Ag+ in place. Incision well approximated without redness, drainage, or edema. -Homan's sign bilaterally, +1 edema bilaterally lower legs, DTRS 0 bilaterally, no clonus.  Disposition:  Home or Self Care  Discharge Orders    Future Orders Please Complete By Expires   HIV antibody      Comments:   This external order was created through the Results Console.   HIV antibody      Comments:   This external order was created through the Results Console.   Antibody screen      Comments:   This external order was created through the Results Console.     Medication List  As of 06/25/2011  2:27 PM   START taking these medications         clindamycin 300 MG capsule   Commonly known as: CLEOCIN   Take 1 capsule (300 mg total) by mouth every 6 (six) hours.      ferrous sulfate 300 (60 FE) MG/5ML syrup   Take 5 mLs (300 mg total) by mouth 2 (two) times daily with a meal.      ibuprofen 600 MG tablet   Commonly known as: ADVIL,MOTRIN   Take 1 tablet (600 mg total) by mouth every 6 (six) hours as needed for pain.      oxyCODONE-acetaminophen 5-325 MG per tablet   Commonly known as: PERCOCET   Take 1-2 tablets by mouth every 4 (four) hours as needed (moderate - severe pain).         CHANGE how you take these medications         labetalol 300 MG tablet   Commonly known as: NORMODYNE   Take 1 tablet (300 mg total) by mouth every 6 (six) hours.   What changed: - medication strength - dose - how often to take the med         CONTINUE taking these medications         prenatal  vitamin w/FE, FA 27-1 MG Tabs         STOP taking these medications         terconazole 0.4 % vaginal cream          Where to get your medications    These are the prescriptions that you need to pick up.   You may get these medications from any pharmacy.         clindamycin 300 MG capsule   ferrous sulfate 300 (60 FE) MG/5ML syrup   ibuprofen 600 MG tablet   labetalol 300 MG tablet   oxyCODONE-acetaminophen 5-325 MG per tablet           Follow-up Information    Follow up with [facility] today. (WED or Thursday with Dr. Pennie Rushing)        Plan: reviewed s/s post op, post partum to  report, continue Ag+ bands.  SignedLavera Guise 06/25/2011, 2:27 PM

## 2011-06-26 ENCOUNTER — Encounter (HOSPITAL_COMMUNITY): Payer: Self-pay | Admitting: Obstetrics and Gynecology

## 2011-06-26 NOTE — Op Note (Signed)
Cesarean Section Procedure Note   Indications: previous uterine incision LOW TRANSVERSE                      Chronic hypertension with superimposed pre-eclampsia  Pre-operative Diagnosis: 37 week 1 day pregnancy.                                                Chronic hypertension with superimposed pre-eclampsia                                                Morbid obesity                                                Prior cs                                                History of MRSA     Post-operative Diagnosis: same   Surgeon: Hal Morales   First Assistant:  Osborn Coho, MD   Surgeon: Hal Morales    Assistants: Nigel Bridgeman   Anesthesia: Epidural anesthesia   ASA Class: 4   Procedure Details   The patient was seen in the Holding Room. The risks, benefits, complications, treatment options, and expected outcomes were discussed with the patient.  The patient concurred with the proposed plan, giving informed consent.  The site of surgery properly noted/marked. The patient was taken to Operating Room #9, identified as Krystal Hartman and the procedure verified as C-Section Delivery. A Time Out was held and the above information confirmed.   After induction of anesthesia, which required three epidural placements for adequate surgical anesthesia, the patient was placed in the supine position with a left lateral tilt.  The abdominal pannus was taped to expose the suprapubic region.  That region was  prepped with Chloraprep in the usual sterile manner.A foley catheter was placed under sterile conditions. Two draining abscess areas adjacent to the prepped area had been separately cleaned and covered with Tegaderm prior to the abdominal prep.  The patient was then draped in the usual fashion.    Suprapubicsubcutaneous injection of 025% Bupivacaine   A Pfannenstiel incision was made and carried down through the subcutaneous tissue to the fascia. Fascial  incision was made and extended transversely. The fascia was separated from the underlying rectus tissue superiorly and inferiorly. The peritoneum was identified and entered. Peritoneal incision was extended longitudinally.  An Alexis retractor  was placed.   A low transverse uterine incision was made two cm above the uterovesical fold, and that incision extended transversely bluntly.The infant was  delivered from the occiput transverse presentation was a 7lb, 15oz female infant  with Apgar scores of 9 at one minute and 9 at five minutes. After the umbilical cord was clamped and cut cord blood was obtained for evaluation. The placenta was removed intact and appeared normal. The uterine outline, tubes and ovaries appeared norma for the gravid  state. The uterine incision was closed with running locked sutures of 0 Vicryl. An imbricating layer of sutures was placed. Hemostasis was observed. Lavage was carried out until clear. The peritoneum was closed with a running suture of 2-0 Vicryl.  The rectus muscles were reapproximated with a figure of 8 suture of 2-0 Vicryl.  The fascia was then reapproximated with a running sutures of 0 Vicryl .Renforcing figure of 8 sutures of 0Vicryl were placed on either side of midline.   The skin was reapproximated with 3-0 moncryl.  A sterile dressing was applied.   Instrument, sponge, and needle counts were correct prior to the abdominal closure and at the conclusion of the case.    Findings:   Placenta contained a 3 vessel cord and was sent to [facility]       Estimated Blood Loss:  500 cc          Drains: none          Total IV Fluids:          Specimens: Placenta          Implants: nonje          Complications: ::"Anesthetic as mentioned above but patient tolerated the procedure well."          Disposition: PACU - hemodynamically stable.          Condition: stable   Attending Attestation: I performed the procedure.   Will be admitted to AICU from  PACU   [facility] P   06/22/2011 12:43 PM            Routing History...     Date/Time From To Method   06/22/2011 1:02 PM Hal Morales, MD [facility] Fax

## 2011-07-11 ENCOUNTER — Inpatient Hospital Stay (HOSPITAL_COMMUNITY)
Admission: AD | Admit: 2011-07-11 | Discharge: 2011-07-11 | Disposition: A | Discharge status: Home / Self Care | Payer: Medicaid Other | Source: Ambulatory Visit | Attending: Obstetrics and Gynecology | Admitting: Obstetrics and Gynecology

## 2011-07-11 DIAGNOSIS — O135 Gestational [pregnancy-induced] hypertension without significant proteinuria, complicating the puerperium: Secondary | ICD-10-CM | POA: Insufficient documentation

## 2011-07-11 LAB — URINALYSIS, ROUTINE W REFLEX MICROSCOPIC
Bilirubin Urine: NEGATIVE
Ketones, ur: NEGATIVE mg/dL
Nitrite: NEGATIVE
pH: 6 (ref 5.0–8.0)

## 2011-07-11 LAB — COMPREHENSIVE METABOLIC PANEL
ALT: 11 U/L (ref 0–35)
Alkaline Phosphatase: 102 U/L (ref 39–117)
CO2: 27 mEq/L (ref 19–32)
GFR calc Af Amer: >90 mL/min (ref >90)
GFR calc non Af Amer: 89 mL/min — ABNORMAL LOW (ref >90)
Glucose, Bld: 71 mg/dL (ref 70–99)
Potassium: 3.6 mEq/L (ref 3.5–5.1)
Sodium: 140 mEq/L (ref 135–145)
Total Protein: 8.1 g/dL (ref 6.0–8.3)

## 2011-07-11 LAB — URIC ACID: Uric Acid, Serum: 7.6 mg/dL — ABNORMAL HIGH (ref 2.4–7.0)

## 2011-07-11 LAB — CBC
HCT: 25.5 % — ABNORMAL LOW (ref 36.0–46.0)
MCHC: 29.8 g/dL — ABNORMAL LOW (ref 30.0–36.0)
RDW: 18.7 % — ABNORMAL HIGH (ref 11.5–15.5)

## 2011-07-11 LAB — URINE MICROSCOPIC-ADD ON

## 2011-07-11 MED ORDER — NIFEDIPINE ER OSMOTIC RELEASE 30 MG PO TB24: 30.0000 mg | ORAL_TABLET | Freq: Every day | ORAL | Status: DC

## 2011-07-11 NOTE — ED Provider Notes (Signed)
History     Chief Complaint  Patient presents with  . Hypertension   HPI Comments: Pt is a G3P2 s/p c-section on 1/3. Pregnancy was complicated by HTN and pre-eclampsia. She received a course of Mag Sulfate after delivery. She currently is 300mg  labetalol TID, which she last took at 2pm. [facility] arrives today after nurse took BP at home and was elevated. She denies any HA/N/V/epigastric pain or visual disturbances.   Hypertension      Past Medical History  Diagnosis Date  . Hypertension   . MRSA (methicillin resistant Staphylococcus aureus) colonization     Past Surgical History  Procedure Date  . Eye surgery   . Cesarean section   . Cesarean section 06/22/2011    Procedure: CESAREAN SECTION;  Surgeon: Hal Morales, MD;  Location: [facility] ORS;  Service: Gynecology;  Laterality: N/A;  MRSA positive, Latex Allergy    Family History  Problem Relation Age of Onset  . Diabetes Mother   . Diabetes Maternal Aunt     History  Substance Use Topics  . Smoking status: Never Smoker   . Smokeless tobacco: Never Used  . Alcohol Use: No    Allergies:  Allergies  Allergen Reactions  . Latex Hives and Itching  . Pork-Derived Products Other (See Comments)    Religous reasons does not eat    No prescriptions prior to admission    Review of Systems  All other systems reviewed and are negative.   Physical Exam   Blood pressure 152/82, pulse 70, temperature 98.8 F (37.1 C), temperature source Oral, resp. rate 20, height 5\' 4"  (1.626 m), weight 165.62 kg (365 lb 2 oz), currently breastfeeding.  Physical Exam  Nursing note and vitals reviewed. Constitutional: She is oriented to person, place, and time. She appears well-developed and well-nourished.  HENT:  Head: Normocephalic.  Neck: Normal range of motion.  Cardiovascular: Normal rate and normal heart sounds.   Respiratory: Effort normal and breath sounds normal.  GI: Soft. She exhibits no distension.  Genitourinary:   deferred  Musculoskeletal: Normal range of motion. She exhibits no edema and no tenderness.  Neurological: She is alert and oriented to person, place, and time. She has normal reflexes.       Neg clonus  Skin: Skin is warm and dry.  Psychiatric: She has a normal mood and affect. Her behavior is normal.   Results for orders placed during the hospital encounter of 07/11/11 (from the past 24 hour(s))  COMPREHENSIVE METABOLIC PANEL     Status: Abnormal   Collection Time   07/11/11  4:22 PM      Component Value Range   Sodium 140  135 - 145 (mEq/L)   Potassium 3.6  3.5 - 5.1 (mEq/L)   Chloride 103  96 - 112 (mEq/L)   CO2 27  19 - 32 (mEq/L)   Glucose, Bld 71  70 - 99 (mg/dL)   BUN 10  6 - 23 (mg/dL)   Creatinine, Ser 9.52  0.50 - 1.10 (mg/dL)   Calcium 9.0  8.4 - 84.1 (mg/dL)   Total Protein 8.1  6.0 - 8.3 (g/dL)   Albumin 3.1 (*) 3.5 - 5.2 (g/dL)   AST 17  0 - 37 (U/L)   ALT 11  0 - 35 (U/L)   Alkaline Phosphatase 102  39 - 117 (U/L)   Total Bilirubin 0.2 (*) 0.3 - 1.2 (mg/dL)   GFR calc non Af Amer 89 (*) >90 (mL/min)   GFR calc Af  Amer >90  >90 (mL/min)  CBC     Status: Abnormal   Collection Time   07/11/11  4:22 PM      Component Value Range   WBC 7.8  4.0 - 10.5 (K/uL)   RBC 3.26 (*) 3.87 - 5.11 (MIL/uL)   Hemoglobin 7.6 (*) 12.0 - 15.0 (g/dL)   HCT 40.9 (*) 81.1 - 46.0 (%)   MCV 78.2  78.0 - 100.0 (fL)   MCH 23.3 (*) 26.0 - 34.0 (pg)   MCHC 29.8 (*) 30.0 - 36.0 (g/dL)   RDW 91.4 (*) 78.2 - 15.5 (%)   Platelets 372  150 - 400 (K/uL)  LACTATE DEHYDROGENASE     Status: Normal   Collection Time   07/11/11  4:22 PM      Component Value Range   LD 172  94 - 250 (U/L)  URIC ACID     Status: Abnormal   Collection Time   07/11/11  4:22 PM      Component Value Range   Uric Acid, Serum 7.6 (*) 2.4 - 7.0 (mg/dL)  URINALYSIS, ROUTINE W REFLEX MICROSCOPIC     Status: Abnormal   Collection Time   07/11/11  4:43 PM      Component Value Range   Color, Urine YELLOW  YELLOW     APPearance CLEAR  CLEAR    Specific Gravity, Urine 1.010  1.005 - 1.030    pH 6.0  5.0 - 8.0    Glucose, UA NEGATIVE  NEGATIVE (mg/dL)   Hgb urine dipstick LARGE (*) NEGATIVE    Bilirubin Urine NEGATIVE  NEGATIVE    Ketones, ur NEGATIVE  NEGATIVE (mg/dL)   Protein, ur NEGATIVE  NEGATIVE (mg/dL)   Urobilinogen, UA 0.2  0.0 - 1.0 (mg/dL)   Nitrite NEGATIVE  NEGATIVE    Leukocytes, UA SMALL (*) NEGATIVE   URINE MICROSCOPIC-ADD ON     Status: Abnormal   Collection Time   07/11/11  4:43 PM      Component Value Range   Squamous Epithelial / LPF MANY (*) RARE    WBC, UA 3-6  <3 (WBC/hpf)   RBC / HPF 11-20  <3 (RBC/hpf)   Bacteria, UA RARE  RARE     Filed Vitals:   07/11/11 1800 07/11/11 1930 07/11/11 2000 07/11/11 2015  BP: 164/82 152/82 163/66 152/82  Pulse: 78 82 83 70  Temp:      TempSrc:      Resp:      Height:      Weight:        MAU Course  Procedures  Assessment and Plan  S/p c-section on 1/3 Hypertension PIH labs normal  Add procardia XL 30mg  PO daily D/C home with PIH precautions Continue labetalol 300mg  TID  F/u office next week.  Discussed with Dr. Marcy Siren M 07/11/2011, 9:54 PM

## 2011-07-11 NOTE — Progress Notes (Signed)
Pt states, " I delivered by C/S on Jun 22, 2011. My bp has been up since I was 34 wks, and I'm labetalol. They told me to come here as my blood pressure was still up."

## 2011-07-27 ENCOUNTER — Encounter: Payer: Self-pay | Admitting: Internal Medicine

## 2011-07-27 ENCOUNTER — Telehealth: Payer: Self-pay | Admitting: *Deleted

## 2011-07-27 ENCOUNTER — Ambulatory Visit (INDEPENDENT_AMBULATORY_CARE_PROVIDER_SITE_OTHER): Payer: Medicaid Other | Admitting: Internal Medicine

## 2011-07-27 VITALS — BP 159/73 | HR 87 | Temp 98.1°F | Ht 64.0 in | Wt 366.0 lb

## 2011-07-27 DIAGNOSIS — L732 Hidradenitis suppurativa: Secondary | ICD-10-CM

## 2011-07-27 NOTE — Assessment & Plan Note (Signed)
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  I have explained the nature of this and suggested she consider evaluation by a dermatologist.  Most of the lesions do spontaneously resolve, though occasionally do require I&D and therefore I also suggested she get into contact with a surgeon that can help manage these when they flare up.  There are no infectious disease issues otherwise identified.  I will send this to her primary physician, Dr. Leslee Home, for appropriate referrals as indicated.

## 2011-07-27 NOTE — Telephone Encounter (Signed)
Office note faxed to Dr. Ruben Im at [phone], patients PCP Wendall Mola CMA

## 2011-07-27 NOTE — Progress Notes (Signed)
  Subjective:    Patient ID: Krystal Hartman, female    DOB: 01-31-1991, 21 y.o.   MRN: [id number]  HPI comes in here after referral by her obstetrician for hydradinitis supparativa.  She was seen by me in the hospital after delivery with concern of an abscess at the site of the c-section incision.  Prior to cultures, she was placed on antibiotics based on the history of having had MRSA infection and concern for the recent incision becoming infected.  However, It was cultured and did not grow anything significant (diphtheroids is considered a contaminate).  She did report a history of recurrent abscesses that have needed I&D by the ED numerous times, but all the cultures had been negative.  The only MRSA culture was a nasal screen.  All of her lesions have been in the groin, axilla or under her breast c/w hydradinitis.  No fever or chills have been associated with this.      Review of Systems  Constitutional: Negative for fever, chills, fatigue and unexpected weight change.  Gastrointestinal: Negative for abdominal pain, diarrhea and constipation.  Musculoskeletal: Negative for myalgias and arthralgias.  Skin: Negative for rash and wound.  Hematological: Negative for adenopathy.  Psychiatric/Behavioral: Negative for dysphoric mood. The patient is not nervous/anxious.        Objective:   Physical Exam  Constitutional: She appears well-developed and well-nourished. No distress.       Morbidly obese  Cardiovascular: Normal rate, regular rhythm and normal heart sounds.  Exam reveals no gallop and no friction rub.   No murmur heard. Pulmonary/Chest: Effort normal and breath sounds normal. No respiratory distress. She has no wheezes. She has no rales.  Abdominal: Soft. Bowel sounds are normal. She exhibits no distension. There is no tenderness. There is no rebound.  Lymphadenopathy:    She has no cervical adenopathy.  Skin: Skin is warm and dry. No rash noted. No erythema.            Assessment & Plan:

## 2011-07-27 NOTE — Patient Instructions (Signed)
Follow up with your primary physician

## 2011-09-13 ENCOUNTER — Encounter: Payer: Self-pay | Admitting: Obstetrics and Gynecology

## 2012-06-19 HISTORY — DX: Maternal care for unspecified type scar from previous cesarean delivery: O34.219

## 2012-07-03 ENCOUNTER — Inpatient Hospital Stay (HOSPITAL_COMMUNITY)
Admission: AD | Admit: 2012-07-03 | Discharge: 2012-07-03 | Disposition: A | Discharge status: Home / Self Care | Payer: Medicaid Other | Source: Ambulatory Visit | Attending: Obstetrics & Gynecology | Admitting: Obstetrics & Gynecology

## 2012-07-03 ENCOUNTER — Encounter (HOSPITAL_COMMUNITY): Payer: Self-pay | Admitting: *Deleted

## 2012-07-03 DIAGNOSIS — R42 Dizziness and giddiness: Secondary | ICD-10-CM

## 2012-07-03 DIAGNOSIS — O99891 Other specified diseases and conditions complicating pregnancy: Secondary | ICD-10-CM | POA: Insufficient documentation

## 2012-07-03 DIAGNOSIS — L732 Hidradenitis suppurativa: Secondary | ICD-10-CM | POA: Insufficient documentation

## 2012-07-03 DIAGNOSIS — Z3201 Encounter for pregnancy test, result positive: Secondary | ICD-10-CM

## 2012-07-03 HISTORY — DX: Furuncle, unspecified: L02.92

## 2012-07-03 LAB — URINALYSIS, ROUTINE W REFLEX MICROSCOPIC
Hgb urine dipstick: NEGATIVE
Leukocytes, UA: NEGATIVE
Nitrite: NEGATIVE
Specific Gravity, Urine: 1.015 (ref 1.005–1.030)
Urobilinogen, UA: 0.2 mg/dL (ref 0.0–1.0)

## 2012-07-03 MED ORDER — HYDROCODONE-ACETAMINOPHEN 5-500 MG PO TABS: 1.0000 | ORAL_TABLET | Freq: Four times a day (QID) | ORAL | Status: DC | PRN

## 2012-07-03 MED ORDER — CEPHALEXIN 500 MG PO CAPS: 500.0000 mg | ORAL_CAPSULE | Freq: Four times a day (QID) | ORAL | Status: DC

## 2012-07-03 NOTE — MAU Note (Signed)
Has abscess on side (3-4 days).  Been having some dizziness, get real hot, feel like I'm going to pass out.

## 2012-07-03 NOTE — MAU Provider Note (Signed)
History     CSN: [id number]  Arrival date and time: 07/03/12 1753   None     No chief complaint on file.  HPI This is a 22 y.o. at [redacted]w[redacted]d who presents with c/o boils on Right side of abdomen. Has had lots of these before and was told she had a MRSA nasal swab but boils have never grown out MRSA.  Was seen by Infectious diseas doctor. States the large one has gotten bigger and hard. No fever. No bleeding   RN Note: Has abscess on side (3-4 days). Been having some dizziness, get real hot, feel like I'm going to pass out.       OB History    Grav Para Term Preterm Abortions TAB SAB Ect Mult Living   5 2 2  0 1 0 1 0 0 2      Past Medical History  Diagnosis Date  . Hypertension   . MRSA (methicillin resistant Staphylococcus aureus) colonization   . Boil     on side    Past Surgical History  Procedure Date  . Eye surgery   . Cesarean section   . Cesarean section 06/22/2011    Procedure: CESAREAN SECTION;  Surgeon: Hal Morales, MD;  Location: [facility]S;  Service: Gynecology;  Laterality: N/A;  MRSA positive, Latex Allergy    Family History  Problem Relation Age of Onset  . Diabetes Mother   . Diabetes Maternal Aunt     History  Substance Use Topics  . Smoking status: Never Smoker   . Smokeless tobacco: Never Used  . Alcohol Use: No    Allergies:  Allergies  Allergen Reactions  . Latex Hives and Itching  . Pork-Derived Products Other (See Comments)    Religous reasons does not eat    Prescriptions prior to admission  Medication Sig Dispense Refill  . acetaminophen (TYLENOL) 500 MG tablet Take 1,000 mg by mouth every 6 (six) hours as needed. pain      . neomycin-bacitracin-polymyxin (NEOSPORIN) OINT Apply 1 application topically 2 (two) times daily.        Review of Systems  Constitutional: Negative for fever and chills.  Respiratory: Negative for cough.   Cardiovascular: Negative for chest pain.  Genitourinary: Negative for dysuria.  Skin:   Boils    Physical Exam   Blood pressure 146/86, pulse 116, temperature 98.2 F (36.8 C), temperature source Oral, resp. rate 20, height 5' 4.5" (1.638 m), weight 179.171 kg (395 lb), last menstrual period 03/13/2012, unknown if currently breastfeeding.  Physical Exam  Constitutional: She is oriented to person, place, and time. She appears well-developed and well-nourished. No distress.       Morbidly obese   HENT:  Head: Normocephalic.  Cardiovascular: Normal rate.   Respiratory: Effort normal.  GI: Soft. She exhibits no distension and no mass. There is no tenderness. There is no rebound and no guarding.  Musculoskeletal: Normal range of motion.  Neurological: She is alert and oriented to person, place, and time.  Skin: Skin is warm and dry.       3-4cm induration on right side of abdomen, no point to it, no fluctuance  Small area below that less than 1cm, with opening, draining scant clear fluid  Psychiatric: She has a normal mood and affect.    MAU Course  Procedures  MDM Results for orders placed during the hospital encounter of 07/03/12 (from the past 72 hour(s))  URINALYSIS, ROUTINE W REFLEX MICROSCOPIC     Status:  Normal   Collection Time   07/03/12  6:15 PM      Component Value Range Comment   Color, Urine YELLOW  YELLOW    APPearance CLEAR  CLEAR    Specific Gravity, Urine 1.015  1.005 - 1.030    pH 6.5  5.0 - 8.0    Glucose, UA NEGATIVE  NEGATIVE mg/dL    Hgb urine dipstick NEGATIVE  NEGATIVE    Bilirubin Urine NEGATIVE  NEGATIVE    Ketones, ur NEGATIVE  NEGATIVE mg/dL    Protein, ur NEGATIVE  NEGATIVE mg/dL    Urobilinogen, UA 0.2  0.0 - 1.0 mg/dL    Nitrite NEGATIVE  NEGATIVE    Leukocytes, UA NEGATIVE  NEGATIVE MICROSCOPIC NOT DONE ON URINES WITH NEGATIVE PROTEIN, BLOOD, LEUKOCYTES, NITRITE, OR GLUCOSE <1000 mg/dL.     Assessment and Plan  A:  SIUP at [redacted]w[redacted]d with fetal heart tones heard      Hydradenitis suppuritiva P:  Discharge      Proof of pregn       Keep clean and dry, warm compresses to area      Rx Keflex for 14 days      May go to [facility] again  [facility] 07/03/2012, 7:46 PM

## 2012-07-08 NOTE — MAU Provider Note (Signed)
Attestation of Attending Supervision of Advanced Practitioner (PA/CNM/NP): Evaluation and management procedures were performed by the Advanced Practitioner under my supervision and collaboration.  I have reviewed the Advanced Practitioner's note and chart, and I agree with the management and plan.  Keshana Klemz, MD, FACOG Attending Obstetrician & Gynecologist Faculty Practice, [facility]  

## 2012-07-22 ENCOUNTER — Ambulatory Visit (INDEPENDENT_AMBULATORY_CARE_PROVIDER_SITE_OTHER): Payer: Self-pay | Admitting: Obstetrics & Gynecology

## 2012-07-22 ENCOUNTER — Encounter: Payer: Self-pay | Admitting: Obstetrics & Gynecology

## 2012-07-22 VITALS — BP 141/100 | Temp 96.9°F | Wt 397.6 lb

## 2012-07-22 DIAGNOSIS — Z23 Encounter for immunization: Secondary | ICD-10-CM

## 2012-07-22 DIAGNOSIS — O0990 Supervision of high risk pregnancy, unspecified, unspecified trimester: Secondary | ICD-10-CM | POA: Insufficient documentation

## 2012-07-22 DIAGNOSIS — O10019 Pre-existing essential hypertension complicating pregnancy, unspecified trimester: Secondary | ICD-10-CM

## 2012-07-22 LAB — POCT URINALYSIS DIP (DEVICE)
Bilirubin Urine: NEGATIVE
Hgb urine dipstick: NEGATIVE
Ketones, ur: NEGATIVE mg/dL
Protein, ur: 30 mg/dL — AB
Specific Gravity, Urine: 1.025 (ref 1.005–1.030)
pH: 6 (ref 5.0–8.0)

## 2012-07-22 LAB — HIV ANTIBODY (ROUTINE TESTING W REFLEX): HIV: NONREACTIVE

## 2012-07-22 MED ORDER — INFLUENZA VIRUS VACC SPLIT PF IM SUSP
0.5000 mL | Freq: Once | INTRAMUSCULAR | Status: AC
  Administered 2012-07-22: 0.5 mL via INTRAMUSCULAR

## 2012-07-22 MED ORDER — LABETALOL HCL 200 MG PO TABS: 200.0000 mg | ORAL_TABLET | Freq: Two times a day (BID) | ORAL | Status: DC

## 2012-07-22 NOTE — Progress Notes (Signed)
H/O CHTN. Obesity, 2 cesarean sections.  Subjective:    Krystal Hartman is a [id number] [redacted]w[redacted]d being seen today for her first obstetrical visit.  Her obstetrical history is significant for obesity, hypertension and 2 previous cesarean sections. Patient does intend to breast feed. Pregnancy history fully reviewed.  Patient reports ankle sore.  Filed Vitals:   07/22/12 0845  BP: 141/100  Temp: 96.9 F (36.1 C)  Weight: 397 lb 9.6 oz (180.35 kg)    HISTORY: OB History    Grav Para Term Preterm Abortions TAB SAB Ect Mult Living   4 2 2  0 1 0 1 0 0 2     # Outc Date GA Lbr Len/2nd Wgt Sex Del Anes PTL Lv   1 TRM 5/11 [redacted]w[redacted]d  7lb3oz(3.26kg) M LTCS      2 TRM 1/13 [redacted]w[redacted]d 00:00 7lb15.5oz(3.615kg) F LTCS EPI  Yes   Comments: No gross physical abnormalities   3 SAB            4 CUR              Past Medical History  Diagnosis Date  . Hypertension   . MRSA (methicillin resistant Staphylococcus aureus) colonization   . Boil     on side   Past Surgical History  Procedure Date  . Eye surgery   . Cesarean section   . Cesarean section 06/22/2011    Procedure: CESAREAN SECTION;  Surgeon: Hal Morales, MD;  Location: [facility];  Service: Gynecology;  Laterality: N/A;  MRSA positive, Latex Allergy   Family History  Problem Relation Age of Onset  . Diabetes Mother   . Diabetes Maternal Aunt      Exam    Uterus:     Pelvic Exam:    Perineum: No Hemorrhoids   Vulva: normal   Vagina:  normal mucosa   pH:    Cervix: no lesions   Adnexa: normal adnexa   Bony Pelvis: average  System: Breast:  normal appearance, no masses or tenderness   Skin: normal coloration and turgor, no rashes    Neurologic: oriented, normal, normal mood   Extremities: normal strength, tone, and muscle mass   HEENT oropharynx clear, no lesions   Mouth/Teeth mucous membranes moist, pharynx normal without lesions and dental hygiene good   Neck supple and no masses   Cardiovascular: regular rate and rhythm,  no murmurs or gallops   Respiratory:  appears well, vitals normal, no respiratory distress, acyanotic, normal RR, neck free of mass or lymphadenopathy, chest clear, no wheezing, crepitations, rhonchi, normal symmetric air entry   Abdomen: obese, not tender   Urinary: urethral meatus normal      Assessment:    Pregnancy: [id number] Patient Active Problem List  Diagnosis  . Morbid obesity  . Hidradenitis suppurativa  . HSV-2 (herpes simplex virus 2) infection  . Anemia  . Chronic hypertension complicating or reason for care during pregnancy  . Cesarean section wound complication  . High-risk pregnancy supervision        Plan:     Initial labs drawn. Prenatal vitamins. Problem list reviewed and updated. Genetic Screening discussed Quad Screen: requested.  Ultrasound discussed; fetal survey: requested.  Follow up in 2 weeks. 50% of 30 min visit spent on counseling and coordination of care.  Labetalol 200 mg BID   Sorren Vallier 07/22/2012

## 2012-07-22 NOTE — Progress Notes (Signed)
U/S scheduled 07/25/12 at 2 pm.

## 2012-07-22 NOTE — Patient Instructions (Signed)
Pregnancy - Second Trimester The second trimester of pregnancy (3 to 6 months) is a period of rapid growth for you and your baby. At the end of the sixth month, your baby is about 9 inches long and weighs 1 1/2 pounds. You will begin to feel the baby move between 18 and 20 weeks of the pregnancy. This is called quickening. Weight gain is faster. A clear fluid (colostrum) may leak out of your breasts. You may feel small contractions of the womb (uterus). This is known as false labor or Braxton-Hicks contractions. This is like a practice for labor when the baby is ready to be born. Usually, the problems with morning sickness have usually passed by the end of your first trimester. Some women develop small dark blotches (called cholasma, mask of pregnancy) on their face that usually goes away after the baby is born. Exposure to the sun makes the blotches worse. Acne may also develop in some pregnant women and pregnant women who have acne, may find that it goes away. PRENATAL EXAMS  Blood work may continue to be done during prenatal exams. These tests are done to check on your health and the probable health of your baby. Blood work is used to follow your blood levels (hemoglobin). Anemia (low hemoglobin) is common during pregnancy. Iron and vitamins are given to help prevent this. You will also be checked for diabetes between 24 and 28 weeks of the pregnancy. Some of the previous blood tests may be repeated.  The size of the uterus is measured during each visit. This is to make sure that the baby is continuing to grow properly according to the dates of the pregnancy.  Your blood pressure is checked every prenatal visit. This is to make sure you are not getting toxemia.  Your urine is checked to make sure you do not have an infection, diabetes or protein in the urine.  Your weight is checked often to make sure gains are happening at the suggested rate. This is to ensure that both you and your baby are growing  normally.  Sometimes, an ultrasound is performed to confirm the proper growth and development of the baby. This is a test which bounces harmless sound waves off the baby so your caregiver can more accurately determine due dates. Sometimes, a specialized test is done on the amniotic fluid surrounding the baby. This test is called an amniocentesis. The amniotic fluid is obtained by sticking a needle into the belly (abdomen). This is done to check the chromosomes in instances where there is a concern about possible genetic problems with the baby. It is also sometimes done near the end of pregnancy if an early delivery is required. In this case, it is done to help make sure the baby's lungs are mature enough for the baby to live outside of the womb. CHANGES OCCURING IN THE SECOND TRIMESTER OF PREGNANCY Your body goes through many changes during pregnancy. They vary from person to person. Talk to your caregiver about changes you notice that you are concerned about.  During the second trimester, you will likely have an increase in your appetite. It is normal to have cravings for certain foods. This varies from person to person and pregnancy to pregnancy.  Your lower abdomen will begin to bulge.  You may have to urinate more often because the uterus and baby are pressing on your bladder. It is also common to get more bladder infections during pregnancy (pain with urination). You can help this by   drinking lots of fluids and emptying your bladder before and after intercourse.  You may begin to get stretch marks on your hips, abdomen, and breasts. These are normal changes in the body during pregnancy. There are no exercises or medications to take that prevent this change.  You may begin to develop swollen and bulging veins (varicose veins) in your legs. Wearing support hose, elevating your feet for 15 minutes, 3 to 4 times a day and limiting salt in your diet helps lessen the problem.  Heartburn may develop  as the uterus grows and pushes up against the stomach. Antacids recommended by your caregiver helps with this problem. Also, eating smaller meals 4 to 5 times a day helps.  Constipation can be treated with a stool softener or adding bulk to your diet. Drinking lots of fluids, vegetables, fruits, and whole grains are helpful.  Exercising is also helpful. If you have been very active up until your pregnancy, most of these activities can be continued during your pregnancy. If you have been less active, it is helpful to start an exercise program such as walking.  Hemorrhoids (varicose veins in the rectum) may develop at the end of the second trimester. Warm sitz baths and hemorrhoid cream recommended by your caregiver helps hemorrhoid problems.  Backaches may develop during this time of your pregnancy. Avoid heavy lifting, wear low heal shoes and practice good posture to help with backache problems.  Some pregnant women develop tingling and numbness of their hand and fingers because of swelling and tightening of ligaments in the wrist (carpel tunnel syndrome). This goes away after the baby is born.  As your breasts enlarge, you may have to get a bigger bra. Get a comfortable, cotton, support bra. Do not get a nursing bra until the last month of the pregnancy if you will be nursing the baby.  You may get a dark line from your belly button to the pubic area called the linea nigra.  You may develop rosy cheeks because of increase blood flow to the face.  You may develop spider looking lines of the face, neck, arms and chest. These go away after the baby is born. HOME CARE INSTRUCTIONS   It is extremely important to avoid all smoking, herbs, alcohol, and unprescribed drugs during your pregnancy. These chemicals affect the formation and growth of the baby. Avoid these chemicals throughout the pregnancy to ensure the delivery of a healthy infant.  Most of your home care instructions are the same as  suggested for the first trimester of your pregnancy. Keep your caregiver's appointments. Follow your caregiver's instructions regarding medication use, exercise and diet.  During pregnancy, you are providing food for you and your baby. Continue to eat regular, well-balanced meals. Choose foods such as meat, fish, milk and other low fat dairy products, vegetables, fruits, and whole-grain breads and cereals. Your caregiver will tell you of the ideal weight gain.  A physical sexual relationship may be continued up until near the end of pregnancy if there are no other problems. Problems could include early (premature) leaking of amniotic fluid from the membranes, vaginal bleeding, abdominal pain, or other medical or pregnancy problems.  Exercise regularly if there are no restrictions. Check with your caregiver if you are unsure of the safety of some of your exercises. The greatest weight gain will occur in the last 2 trimesters of pregnancy. Exercise will help you:  Control your weight.  Get you in shape for labor and delivery.  Lose weight   after you have the baby.  Wear a good support or jogging bra for breast tenderness during pregnancy. This may help if worn during sleep. Pads or tissues may be used in the bra if you are leaking colostrum.  Do not use hot tubs, steam rooms or saunas throughout the pregnancy.  Wear your seat belt at all times when driving. This protects you and your baby if you are in an accident.  Avoid raw meat, uncooked cheese, cat litter boxes and soil used by cats. These carry germs that can cause birth defects in the baby.  The second trimester is also a good time to visit your dentist for your dental health if this has not been done yet. Getting your teeth cleaned is OK. Use a soft toothbrush. Brush gently during pregnancy.  It is easier to loose urine during pregnancy. Tightening up and strengthening the pelvic muscles will help with this problem. Practice stopping your  urination while you are going to the bathroom. These are the same muscles you need to strengthen. It is also the muscles you would use as if you were trying to stop from passing gas. You can practice tightening these muscles up 10 times a set and repeating this about 3 times per day. Once you know what muscles to tighten up, do not perform these exercises during urination. It is more likely to contribute to an infection by backing up the urine.  Ask for help if you have financial, counseling or nutritional needs during pregnancy. Your caregiver will be able to offer counseling for these needs as well as refer you for other special needs.  Your skin may become oily. If so, wash your face with mild soap, use non-greasy moisturizer and oil or cream based makeup. MEDICATIONS AND DRUG USE IN PREGNANCY  Take prenatal vitamins as directed. The vitamin should contain 1 milligram of folic acid. Keep all vitamins out of reach of children. Only a couple vitamins or tablets containing iron may be fatal to a baby or young child when ingested.  Avoid use of all medications, including herbs, over-the-counter medications, not prescribed or suggested by your caregiver. Only take over-the-counter or prescription medicines for pain, discomfort, or fever as directed by your caregiver. Do not use aspirin.  Let your caregiver also know about herbs you may be using.  Alcohol is related to a number of birth defects. This includes fetal alcohol syndrome. All alcohol, in any form, should be avoided completely. Smoking will cause low birth rate and premature babies.  Street or illegal drugs are very harmful to the baby. They are absolutely forbidden. A baby born to an addicted mother will be addicted at birth. The baby will go through the same withdrawal an adult does. SEEK MEDICAL CARE IF:  You have any concerns or worries during your pregnancy. It is better to call with your questions if you feel they cannot wait, rather  than worry about them. SEEK IMMEDIATE MEDICAL CARE IF:   An unexplained oral temperature above 102 F (38.9 C) develops, or as your caregiver suggests.  You have leaking of fluid from the vagina (birth canal). If leaking membranes are suspected, take your temperature and tell your caregiver of this when you call.  There is vaginal spotting, bleeding, or passing clots. Tell your caregiver of the amount and how many pads are used. Light spotting in pregnancy is common, especially following intercourse.  You develop a bad smelling vaginal discharge with a change in the color from clear   to white.  You continue to feel sick to your stomach (nauseated) and have no relief from remedies suggested. You vomit blood or coffee ground-like materials.  You lose more than 2 pounds of weight or gain more than 2 pounds of weight over 1 week, or as suggested by your caregiver.  You notice swelling of your face, hands, feet, or legs.  You get exposed to German measles and have never had them.  You are exposed to fifth disease or chickenpox.  You develop belly (abdominal) pain. Round ligament discomfort is a common non-cancerous (benign) cause of abdominal pain in pregnancy. Your caregiver still must evaluate you.  You develop a bad headache that does not go away.  You develop fever, diarrhea, pain with urination, or shortness of breath.  You develop visual problems, blurry, or double vision.  You fall or are in a car accident or any kind of trauma.  There is mental or physical violence at home. Document Released: 05/30/2001 Document Revised: 08/28/2011 Document Reviewed: 12/02/2008 [employer] Patient Information 2013 [employer].  

## 2012-07-22 NOTE — Progress Notes (Signed)
Patient reports trouble bearing weight on right ankle.  It was a problem before pregnancy but has been getting progressively worse.   Patient has some burning and occasional itching in her vaginal area and has a history of yeast infections during pregnancy.  Patient has a hx of hypertension and is currently not treated with any medications. Has a hx of being on labetalol.

## 2012-07-23 ENCOUNTER — Encounter: Payer: Self-pay | Admitting: *Deleted

## 2012-07-23 LAB — OBSTETRIC PANEL
Antibody Screen: NEGATIVE
Eosinophils Absolute: 0.1 10*3/uL (ref 0.0–0.7)
Eosinophils Relative: 2 % (ref 0–5)
HCT: 27.7 % — ABNORMAL LOW (ref 36.0–46.0)
Lymphs Abs: 1.9 10*3/uL (ref 0.7–4.0)
MCH: 24 pg — ABNORMAL LOW (ref 26.0–34.0)
MCV: 73.9 fL — ABNORMAL LOW (ref 78.0–100.0)
Monocytes Absolute: 0.3 10*3/uL (ref 0.1–1.0)
Platelets: 317 10*3/uL (ref 150–400)
RBC: 3.75 MIL/uL — ABNORMAL LOW (ref 3.87–5.11)
Rh Type: POSITIVE
Rubella: 2.62 Index — ABNORMAL HIGH (ref <0.90)

## 2012-07-24 LAB — HEMOGLOBINOPATHY EVALUATION
Hgb A: 97.6 % (ref 96.8–97.8)
Hgb S Quant: 0 %

## 2012-07-25 ENCOUNTER — Ambulatory Visit (HOSPITAL_COMMUNITY)
Admission: RE | Admit: 2012-07-25 | Discharge: 2012-07-25 | Disposition: A | Discharge status: Home / Self Care | Payer: Medicaid Other | Source: Ambulatory Visit | Attending: Obstetrics & Gynecology | Admitting: Obstetrics & Gynecology

## 2012-07-25 DIAGNOSIS — O0990 Supervision of high risk pregnancy, unspecified, unspecified trimester: Secondary | ICD-10-CM

## 2012-07-25 DIAGNOSIS — Z23 Encounter for immunization: Secondary | ICD-10-CM

## 2012-07-25 DIAGNOSIS — O09899 Supervision of other high risk pregnancies, unspecified trimester: Secondary | ICD-10-CM | POA: Insufficient documentation

## 2012-08-05 ENCOUNTER — Encounter: Payer: Self-pay | Admitting: Obstetrics and Gynecology

## 2012-08-05 ENCOUNTER — Ambulatory Visit (INDEPENDENT_AMBULATORY_CARE_PROVIDER_SITE_OTHER): Payer: Self-pay | Admitting: Obstetrics and Gynecology

## 2012-08-05 ENCOUNTER — Other Ambulatory Visit: Payer: Self-pay | Admitting: Obstetrics and Gynecology

## 2012-08-05 VITALS — BP 144/92 | Temp 97.5°F | Wt 399.6 lb

## 2012-08-05 DIAGNOSIS — O0990 Supervision of high risk pregnancy, unspecified, unspecified trimester: Secondary | ICD-10-CM

## 2012-08-05 DIAGNOSIS — B009 Herpesviral infection, unspecified: Secondary | ICD-10-CM

## 2012-08-05 DIAGNOSIS — O10019 Pre-existing essential hypertension complicating pregnancy, unspecified trimester: Secondary | ICD-10-CM

## 2012-08-05 DIAGNOSIS — L732 Hidradenitis suppurativa: Secondary | ICD-10-CM

## 2012-08-05 DIAGNOSIS — O4440 Low lying placenta NOS or without hemorrhage, unspecified trimester: Secondary | ICD-10-CM | POA: Insufficient documentation

## 2012-08-05 DIAGNOSIS — O4400 Placenta previa specified as without hemorrhage, unspecified trimester: Secondary | ICD-10-CM

## 2012-08-05 DIAGNOSIS — O909 Complication of the puerperium, unspecified: Secondary | ICD-10-CM

## 2012-08-05 MED ORDER — PRENATAL VITAMINS 0.8 MG PO TABS: 1.0000 | ORAL_TABLET | Freq: Every day | ORAL | Status: DC

## 2012-08-05 NOTE — Progress Notes (Signed)
Patient doing well without complaints. Reviewed ultrasound results with patient and low lying placenta. Patient now aware that she needs to start taking labetalol. Quad screen today. Patient desires Mirena IUD for contraception

## 2012-08-05 NOTE — Progress Notes (Signed)
P=100 States has not started labetolol yet- didn't realize was sent to pharmacy, States only has swelling in right ankle when walks a lot. C/o pain LLQ for 2 days when tries to get up fast.

## 2012-08-05 NOTE — Progress Notes (Signed)
Nutrition note: 1st nutr visit Pt has h/o of obesity.  Pt has gained 19.6# @ [redacted]w[redacted]d, which is > expected. Pt reports eating 2 meals & 2 snacks/d. Pt reports no N&V but has heartburn occ. NKFA. Pt is not taking PNV because she ran out - disc ordering them with MD. Pt received verbal & written education on general nutrition during pregnancy. Disc tips to decrease heartburn & encouraged PNV daily. Disc wt gain goals of 11-20# or 0.5#/wk. Pt agrees to start taking PNV again. Pt has WIC & plans to BF. F/u if referred Blondell Reveal, MS, RD, LDN

## 2012-08-06 ENCOUNTER — Encounter: Payer: Self-pay | Admitting: *Deleted

## 2012-08-06 LAB — POCT URINALYSIS DIP (DEVICE)
Bilirubin Urine: NEGATIVE
Hgb urine dipstick: NEGATIVE
Ketones, ur: NEGATIVE mg/dL
Protein, ur: NEGATIVE mg/dL
Specific Gravity, Urine: 1.01 (ref 1.005–1.030)
pH: 7 (ref 5.0–8.0)

## 2012-08-19 ENCOUNTER — Ambulatory Visit (INDEPENDENT_AMBULATORY_CARE_PROVIDER_SITE_OTHER): Payer: Medicaid Other | Admitting: Obstetrics and Gynecology

## 2012-08-19 ENCOUNTER — Encounter: Payer: Self-pay | Admitting: Obstetrics and Gynecology

## 2012-08-19 VITALS — BP 134/86 | Temp 96.9°F | Wt >= 6400 oz

## 2012-08-19 DIAGNOSIS — O4402 Placenta previa specified as without hemorrhage, second trimester: Secondary | ICD-10-CM

## 2012-08-19 DIAGNOSIS — O10019 Pre-existing essential hypertension complicating pregnancy, unspecified trimester: Secondary | ICD-10-CM

## 2012-08-19 DIAGNOSIS — O4400 Placenta previa specified as without hemorrhage, unspecified trimester: Secondary | ICD-10-CM

## 2012-08-19 DIAGNOSIS — O34219 Maternal care for unspecified type scar from previous cesarean delivery: Secondary | ICD-10-CM

## 2012-08-19 LAB — POCT URINALYSIS DIP (DEVICE)
Hgb urine dipstick: NEGATIVE
Protein, ur: 30 mg/dL — AB
Specific Gravity, Urine: >=1.03 (ref 1.005–1.030)
Urobilinogen, UA: 1 mg/dL (ref 0.0–1.0)

## 2012-08-19 MED ORDER — CEPHALEXIN 500 MG PO CAPS: 500.0000 mg | ORAL_CAPSULE | Freq: Four times a day (QID) | ORAL | Status: DC

## 2012-08-19 NOTE — Progress Notes (Signed)
Patient doing well without complaints. BP better controlled. Patient reports presence of breast abscess on right breast. She noticed in 2 days ago. She states that she often has breast abscesses during pregnancy. PE: 4 cm fluctuant mass, slightly tender to touch on medial side of right breast, at 4 o'clock position near chest wall. Patient advised to apply warm compresses to area every hour for 15 minutes. rx Keflex provided

## 2012-09-02 ENCOUNTER — Other Ambulatory Visit: Payer: Self-pay | Admitting: Obstetrics and Gynecology

## 2012-09-02 ENCOUNTER — Ambulatory Visit (INDEPENDENT_AMBULATORY_CARE_PROVIDER_SITE_OTHER): Payer: Medicaid Other | Admitting: Obstetrics and Gynecology

## 2012-09-02 ENCOUNTER — Encounter: Payer: Self-pay | Admitting: Obstetrics and Gynecology

## 2012-09-02 VITALS — BP 141/83 | Temp 97.0°F | Wt >= 6400 oz

## 2012-09-02 DIAGNOSIS — O10012 Pre-existing essential hypertension complicating pregnancy, second trimester: Secondary | ICD-10-CM

## 2012-09-02 DIAGNOSIS — O34219 Maternal care for unspecified type scar from previous cesarean delivery: Secondary | ICD-10-CM

## 2012-09-02 DIAGNOSIS — B009 Herpesviral infection, unspecified: Secondary | ICD-10-CM

## 2012-09-02 DIAGNOSIS — O10019 Pre-existing essential hypertension complicating pregnancy, unspecified trimester: Secondary | ICD-10-CM

## 2012-09-02 LAB — POCT URINALYSIS DIP (DEVICE)
Glucose, UA: NEGATIVE mg/dL
Hgb urine dipstick: NEGATIVE
Nitrite: NEGATIVE
Specific Gravity, Urine: 1.02 (ref 1.005–1.030)
Urobilinogen, UA: 0.2 mg/dL (ref 0.0–1.0)
pH: 7 (ref 5.0–8.0)

## 2012-09-02 NOTE — Patient Instructions (Signed)
Pregnancy - Second Trimester The second trimester of pregnancy (3 to 6 months) is a period of rapid growth for you and your baby. At the end of the sixth month, your baby is about 9 inches long and weighs 1 1/2 pounds. You will begin to feel the baby move between 18 and 20 weeks of the pregnancy. This is called quickening. Weight gain is faster. A clear fluid (colostrum) may leak out of your breasts. You may feel small contractions of the womb (uterus). This is known as false labor or Braxton-Hicks contractions. This is like a practice for labor when the baby is ready to be born. Usually, the problems with morning sickness have usually passed by the end of your first trimester. Some women develop small dark blotches (called cholasma, mask of pregnancy) on their face that usually goes away after the baby is born. Exposure to the sun makes the blotches worse. Acne may also develop in some pregnant women and pregnant women who have acne, may find that it goes away. PRENATAL EXAMS  Blood work may continue to be done during prenatal exams. These tests are done to check on your health and the probable health of your baby. Blood work is used to follow your blood levels (hemoglobin). Anemia (low hemoglobin) is common during pregnancy. Iron and vitamins are given to help prevent this. You will also be checked for diabetes between 24 and 28 weeks of the pregnancy. Some of the previous blood tests may be repeated.  The size of the uterus is measured during each visit. This is to make sure that the baby is continuing to grow properly according to the dates of the pregnancy.  Your blood pressure is checked every prenatal visit. This is to make sure you are not getting toxemia.  Your urine is checked to make sure you do not have an infection, diabetes or protein in the urine.  Your weight is checked often to make sure gains are happening at the suggested rate. This is to ensure that both you and your baby are growing  normally.  Sometimes, an ultrasound is performed to confirm the proper growth and development of the baby. This is a test which bounces harmless sound waves off the baby so your caregiver can more accurately determine due dates. Sometimes, a specialized test is done on the amniotic fluid surrounding the baby. This test is called an amniocentesis. The amniotic fluid is obtained by sticking a needle into the belly (abdomen). This is done to check the chromosomes in instances where there is a concern about possible genetic problems with the baby. It is also sometimes done near the end of pregnancy if an early delivery is required. In this case, it is done to help make sure the baby's lungs are mature enough for the baby to live outside of the womb. CHANGES OCCURING IN THE SECOND TRIMESTER OF PREGNANCY Your body goes through many changes during pregnancy. They vary from person to person. Talk to your caregiver about changes you notice that you are concerned about.  During the second trimester, you will likely have an increase in your appetite. It is normal to have cravings for certain foods. This varies from person to person and pregnancy to pregnancy.  Your lower abdomen will begin to bulge.  You may have to urinate more often because the uterus and baby are pressing on your bladder. It is also common to get more bladder infections during pregnancy (pain with urination). You can help this by   drinking lots of fluids and emptying your bladder before and after intercourse.  You may begin to get stretch marks on your hips, abdomen, and breasts. These are normal changes in the body during pregnancy. There are no exercises or medications to take that prevent this change.  You may begin to develop swollen and bulging veins (varicose veins) in your legs. Wearing support hose, elevating your feet for 15 minutes, 3 to 4 times a day and limiting salt in your diet helps lessen the problem.  Heartburn may develop  as the uterus grows and pushes up against the stomach. Antacids recommended by your caregiver helps with this problem. Also, eating smaller meals 4 to 5 times a day helps.  Constipation can be treated with a stool softener or adding bulk to your diet. Drinking lots of fluids, vegetables, fruits, and whole grains are helpful.  Exercising is also helpful. If you have been very active up until your pregnancy, most of these activities can be continued during your pregnancy. If you have been less active, it is helpful to start an exercise program such as walking.  Hemorrhoids (varicose veins in the rectum) may develop at the end of the second trimester. Warm sitz baths and hemorrhoid cream recommended by your caregiver helps hemorrhoid problems.  Backaches may develop during this time of your pregnancy. Avoid heavy lifting, wear low heal shoes and practice good posture to help with backache problems.  Some pregnant women develop tingling and numbness of their hand and fingers because of swelling and tightening of ligaments in the wrist (carpel tunnel syndrome). This goes away after the baby is born.  As your breasts enlarge, you may have to get a bigger bra. Get a comfortable, cotton, support bra. Do not get a nursing bra until the last month of the pregnancy if you will be nursing the baby.  You may get a dark line from your belly button to the pubic area called the linea nigra.  You may develop rosy cheeks because of increase blood flow to the face.  You may develop spider looking lines of the face, neck, arms and chest. These go away after the baby is born. HOME CARE INSTRUCTIONS   It is extremely important to avoid all smoking, herbs, alcohol, and unprescribed drugs during your pregnancy. These chemicals affect the formation and growth of the baby. Avoid these chemicals throughout the pregnancy to ensure the delivery of a healthy infant.  Most of your home care instructions are the same as  suggested for the first trimester of your pregnancy. Keep your caregiver's appointments. Follow your caregiver's instructions regarding medication use, exercise and diet.  During pregnancy, you are providing food for you and your baby. Continue to eat regular, well-balanced meals. Choose foods such as meat, fish, milk and other low fat dairy products, vegetables, fruits, and whole-grain breads and cereals. Your caregiver will tell you of the ideal weight gain.  A physical sexual relationship may be continued up until near the end of pregnancy if there are no other problems. Problems could include early (premature) leaking of amniotic fluid from the membranes, vaginal bleeding, abdominal pain, or other medical or pregnancy problems.  Exercise regularly if there are no restrictions. Check with your caregiver if you are unsure of the safety of some of your exercises. The greatest weight gain will occur in the last 2 trimesters of pregnancy. Exercise will help you:  Control your weight.  Get you in shape for labor and delivery.  Lose weight   after you have the baby.  Wear a good support or jogging bra for breast tenderness during pregnancy. This may help if worn during sleep. Pads or tissues may be used in the bra if you are leaking colostrum.  Do not use hot tubs, steam rooms or saunas throughout the pregnancy.  Wear your seat belt at all times when driving. This protects you and your baby if you are in an accident.  Avoid raw meat, uncooked cheese, cat litter boxes and soil used by cats. These carry germs that can cause birth defects in the baby.  The second trimester is also a good time to visit your dentist for your dental health if this has not been done yet. Getting your teeth cleaned is OK. Use a soft toothbrush. Brush gently during pregnancy.  It is easier to loose urine during pregnancy. Tightening up and strengthening the pelvic muscles will help with this problem. Practice stopping your  urination while you are going to the bathroom. These are the same muscles you need to strengthen. It is also the muscles you would use as if you were trying to stop from passing gas. You can practice tightening these muscles up 10 times a set and repeating this about 3 times per day. Once you know what muscles to tighten up, do not perform these exercises during urination. It is more likely to contribute to an infection by backing up the urine.  Ask for help if you have financial, counseling or nutritional needs during pregnancy. Your caregiver will be able to offer counseling for these needs as well as refer you for other special needs.  Your skin may become oily. If so, wash your face with mild soap, use non-greasy moisturizer and oil or cream based makeup. MEDICATIONS AND DRUG USE IN PREGNANCY  Take prenatal vitamins as directed. The vitamin should contain 1 milligram of folic acid. Keep all vitamins out of reach of children. Only a couple vitamins or tablets containing iron may be fatal to a baby or young child when ingested.  Avoid use of all medications, including herbs, over-the-counter medications, not prescribed or suggested by your caregiver. Only take over-the-counter or prescription medicines for pain, discomfort, or fever as directed by your caregiver. Do not use aspirin.  Let your caregiver also know about herbs you may be using.  Alcohol is related to a number of birth defects. This includes fetal alcohol syndrome. All alcohol, in any form, should be avoided completely. Smoking will cause low birth rate and premature babies.  Street or illegal drugs are very harmful to the baby. They are absolutely forbidden. A baby born to an addicted mother will be addicted at birth. The baby will go through the same withdrawal an adult does. SEEK MEDICAL CARE IF:  You have any concerns or worries during your pregnancy. It is better to call with your questions if you feel they cannot wait, rather  than worry about them. SEEK IMMEDIATE MEDICAL CARE IF:   An unexplained oral temperature above 102 F (38.9 C) develops, or as your caregiver suggests.  You have leaking of fluid from the vagina (birth canal). If leaking membranes are suspected, take your temperature and tell your caregiver of this when you call.  There is vaginal spotting, bleeding, or passing clots. Tell your caregiver of the amount and how many pads are used. Light spotting in pregnancy is common, especially following intercourse.  You develop a bad smelling vaginal discharge with a change in the color from clear   to white.  You continue to feel sick to your stomach (nauseated) and have no relief from remedies suggested. You vomit blood or coffee ground-like materials.  You lose more than 2 pounds of weight or gain more than 2 pounds of weight over 1 week, or as suggested by your caregiver.  You notice swelling of your face, hands, feet, or legs.  You get exposed to German measles and have never had them.  You are exposed to fifth disease or chickenpox.  You develop belly (abdominal) pain. Round ligament discomfort is a common non-cancerous (benign) cause of abdominal pain in pregnancy. Your caregiver still must evaluate you.  You develop a bad headache that does not go away.  You develop fever, diarrhea, pain with urination, or shortness of breath.  You develop visual problems, blurry, or double vision.  You fall or are in a car accident or any kind of trauma.  There is mental or physical violence at home. Document Released: 05/30/2001 Document Revised: 08/28/2011 Document Reviewed: 12/02/2008 [employer] Patient Information 2013 [employer].  

## 2012-09-02 NOTE — Progress Notes (Signed)
Doing well. Breast abscess cleared on Keflex. Continue labetalol. Return 3 wks for glucola.

## 2012-09-02 NOTE — Progress Notes (Signed)
Pulse: 103

## 2012-09-12 ENCOUNTER — Telehealth: Payer: Self-pay | Admitting: *Deleted

## 2012-09-12 DIAGNOSIS — L732 Hidradenitis suppurativa: Secondary | ICD-10-CM

## 2012-09-12 MED ORDER — CEPHALEXIN 500 MG PO CAPS: 500.0000 mg | ORAL_CAPSULE | Freq: Four times a day (QID) | ORAL | Status: DC

## 2012-09-12 NOTE — Telephone Encounter (Signed)
Krystal Hartman called 09/11/12 and left a message she is a patient of Dr. Jolayne Panther and has a cyst under her stomach and said Dr. York Spaniel to call if she gets another one- may need antibiotics and wants pain med.   Called Deirdre and spoke to a female who said that she is her Mom and will tell her we called and to call us back.

## 2012-09-12 NOTE — Telephone Encounter (Signed)
Krystal Hartman called back and left a message to call her back. Called [name] and she states she gets these cysts often and last pregnancy had to have a big one cut off.  States the one she had under her breast earlier this month got better, but now has one under her abdomen that is small- about size of quarter and painful to touch- is using warm compresses- wants to know if Dr. Jolayne Panther wants her to take Keflex 500mg  QID x 7days again or come in sooner to be seen. Also wants pain med- states usually gets vicodin with good relief. Informed her I would need to talk with a doctor and call her back .

## 2012-09-12 NOTE — Telephone Encounter (Signed)
Discussed with Dr. Debroah Loop and renewed Keflex, but not vicodin. Called Krystal Hartman and informed her Keflex was renewed ,but not Vicodin-may take tylenol as needed.; continue warm compresses.   Instructed her if the area doesn't improve or worsens- to call clinic back- may need to be seen- if is after hours/weekend please go to [facility]. Patient voices understanding.

## 2012-09-13 IMAGING — US US FETAL BPP W/O NONSTRESS
1 series · 13 of 14 positions shown · non-contrast
Comparison: none

[Series 1: us fetal bpp w/o nonstress · non-contrast · 14 acquisitions, 13 frames shown]
[im 1/14]
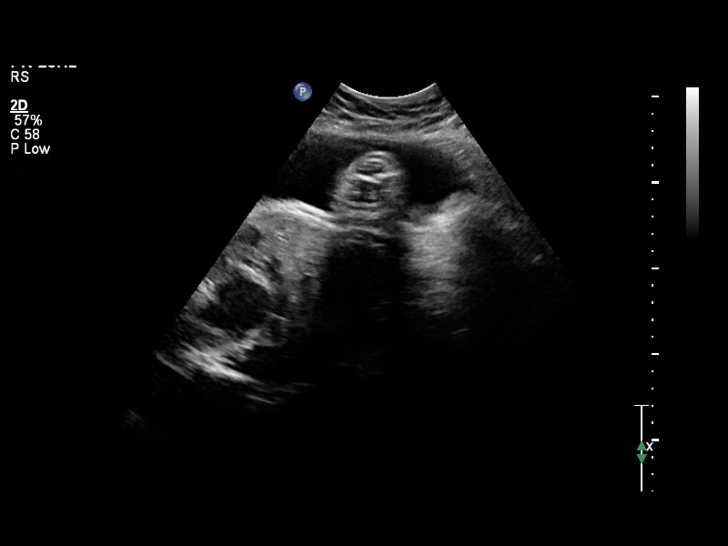
[im 2/14]
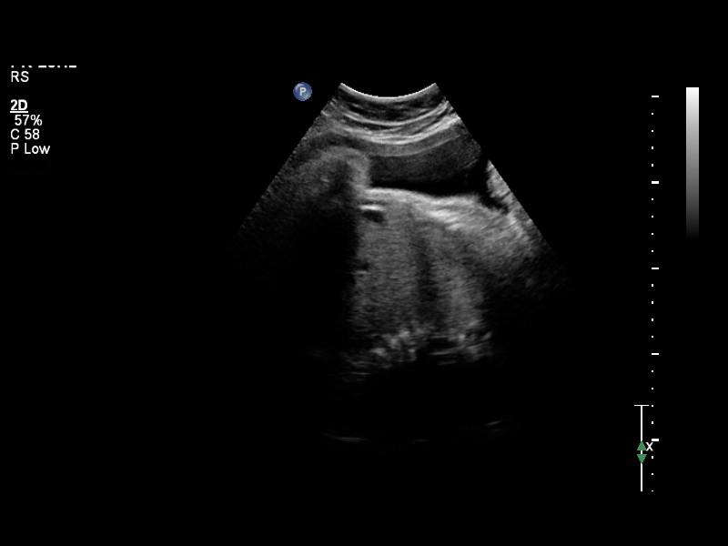
[im 3/14]
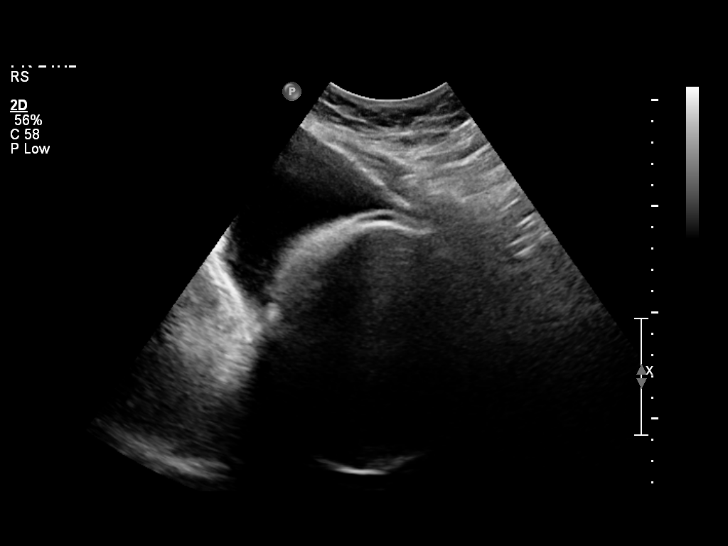
[im 4/14]
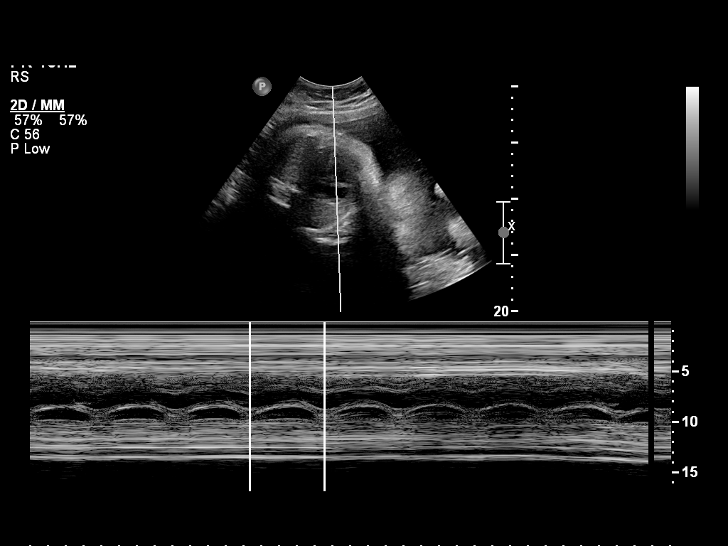
[im 5/14]
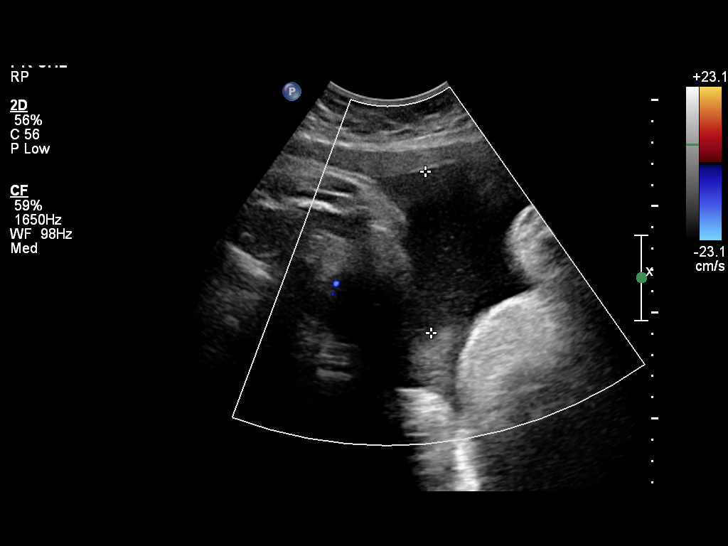
[im 6/14]
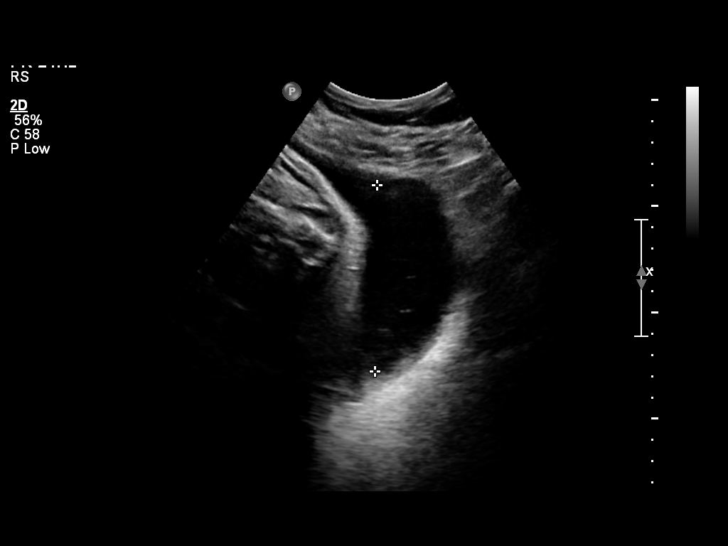
[im 8/14]
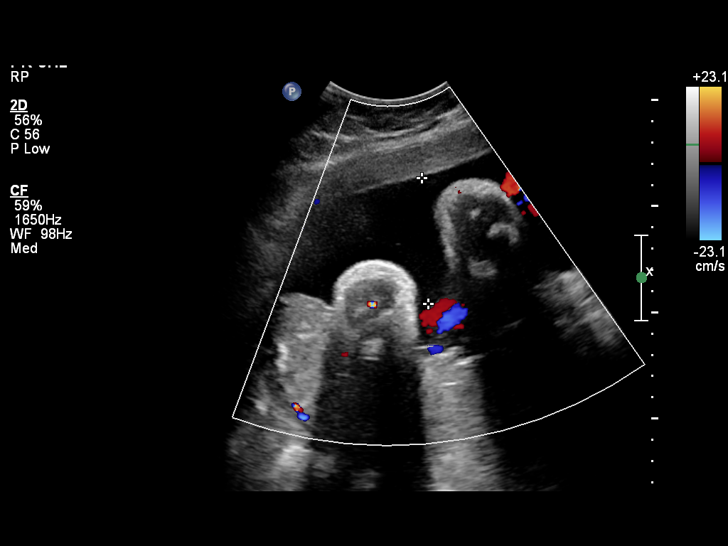
[im 9/14]
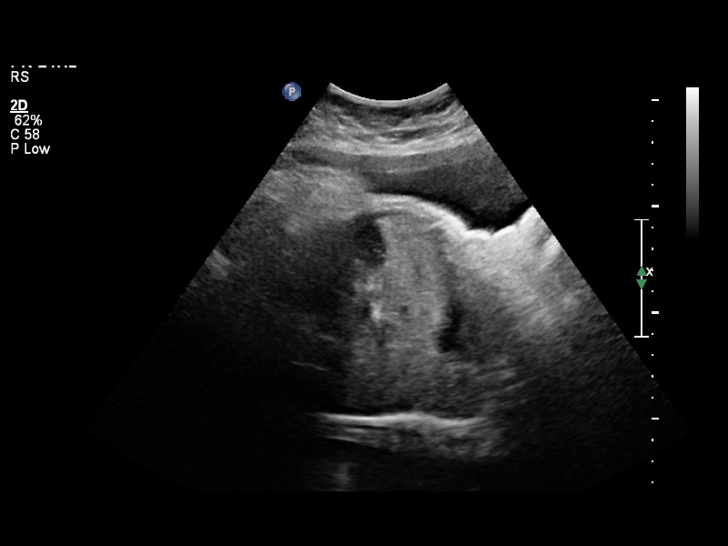
[im 10/14]
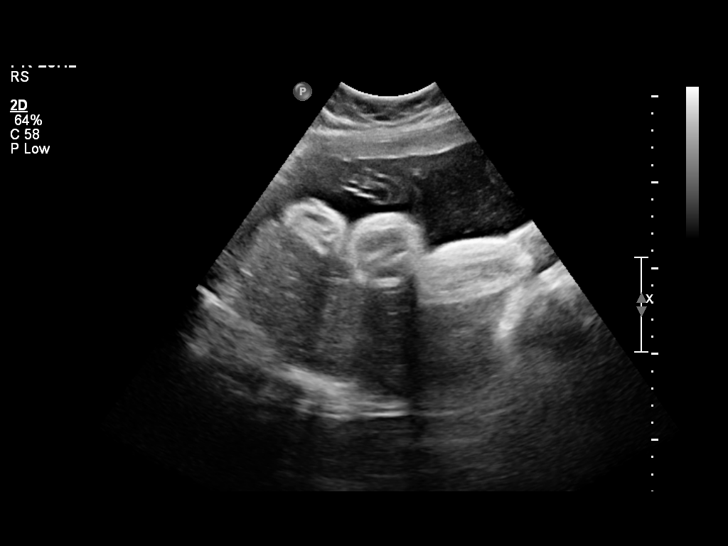
[im 11/14]
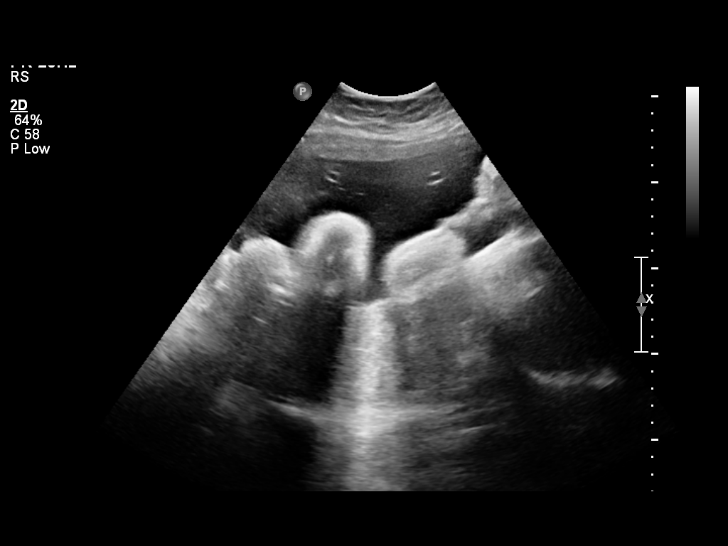
[im 12/14]
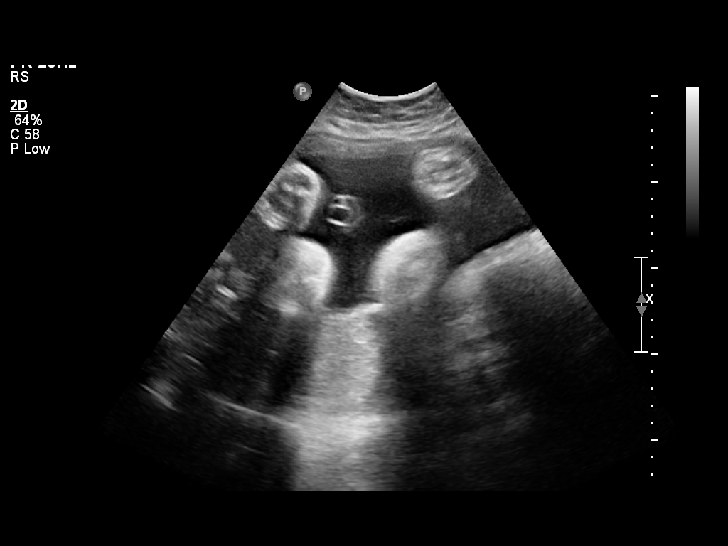
[im 13/14]
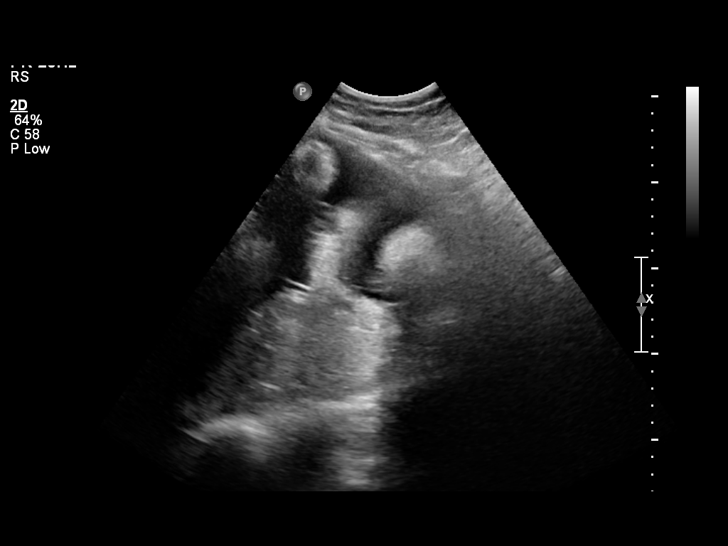
[im 14/14]
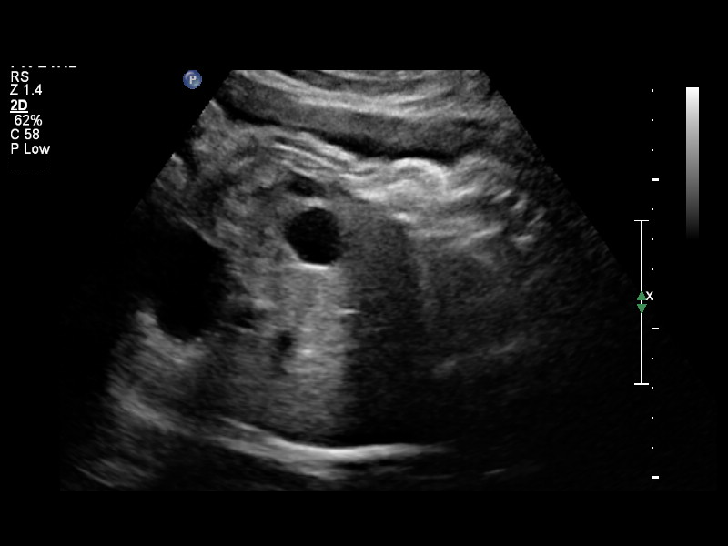

[13 of 14 positions shown; findings below may reference images not displayed]

OBSTETRICS REPORT
                      (Signed Final 06/17/2011 [DATE])

 Order#:         [id number]
Procedures

Indications

 Assess fetal well being
 Chronic hypertension
 Increased BMI  (393 lbs.)
 Polyhydramnios
Fetal Evaluation

 Fetal Heart Rate:  143                         bpm
 Cardiac Activity:  Observed
 Presentation:      Cephalic
 Placenta:          Posterior, above cervical
                    os

 Amniotic Fluid
 AFI FV:      Polyhydramnios
 AFI Sum:     28.67   cm     > 97  %Tile     Larg Pckt:   8.73   cm
 RUQ:   7.57   cm    RLQ:    5.9    cm    LUQ:   6.47    cm   LLQ:    8.73   cm
Biophysical Evaluation

 Amniotic F.V:   Polyhydramnios             F. Tone:        Observed
 F. Movement:    Observed                   Score:          [DATE]
 F. Breathing:   Not Observed
Gestational Age

 Clinical EDD:  36w 3d                                        EDD:   07/12/11
 Best:          36w 3d    Det. By:   Clinical EDD             EDD:   07/12/11
Cervix Uterus Adnexa

 Cervix:       Not visualized (advanced GA >34 wks)
Impression

 Single living IUP with assigned GA of 36w 3d.
 BPP is [DATE]. Sustained fetal breathing was not seen.
 Polyhydramnios.

## 2012-09-14 IMAGING — US US FETAL BPP W/O NONSTRESS
1 series · 8 of 8 positions shown · non-contrast
Comparison: none

[Series 1: us fetal bpp w/o nonstress · non-contrast · 8 acquisitions, 8 frames shown]
[im 1/8]
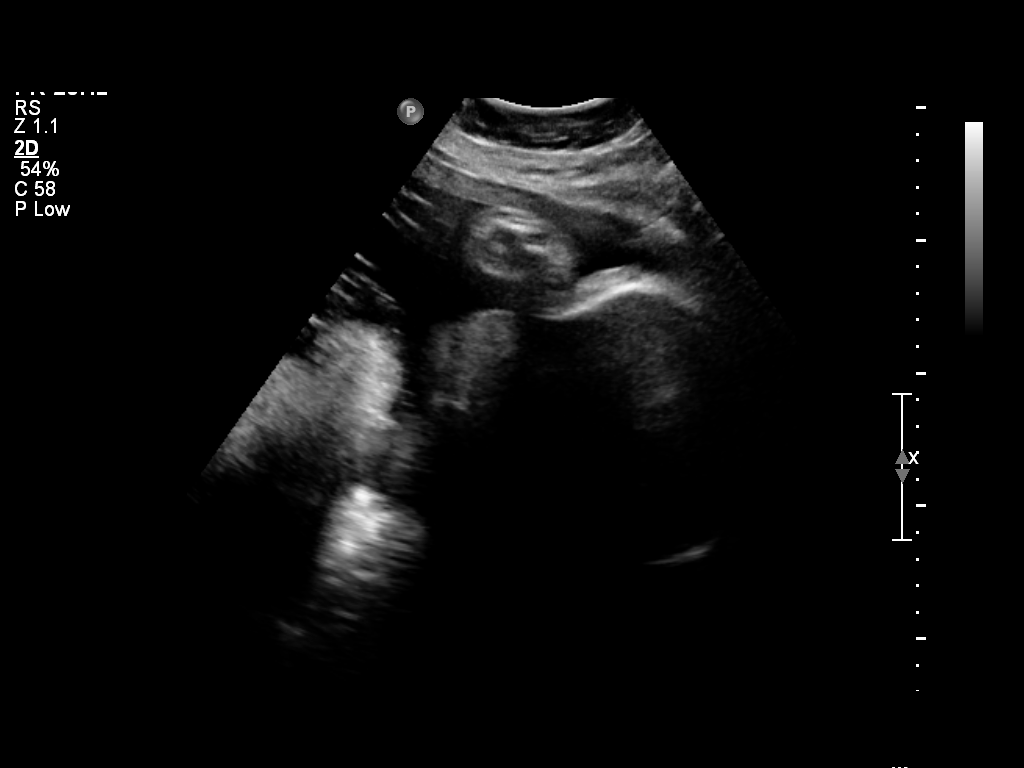
[im 2/8]
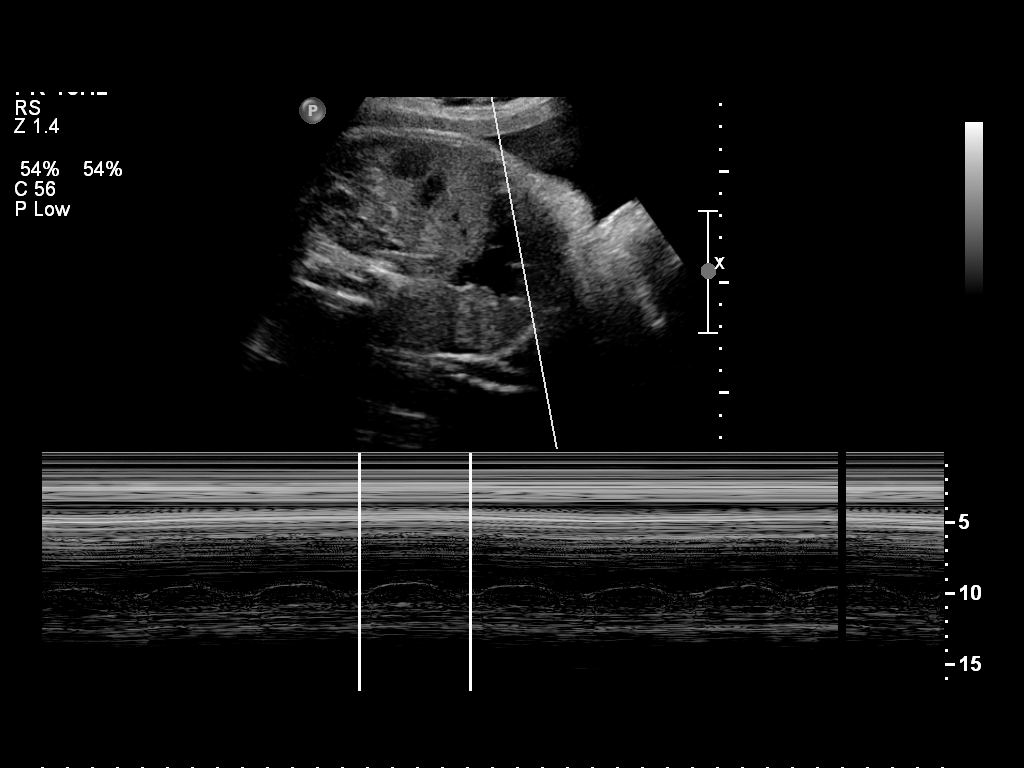
[im 3/8]
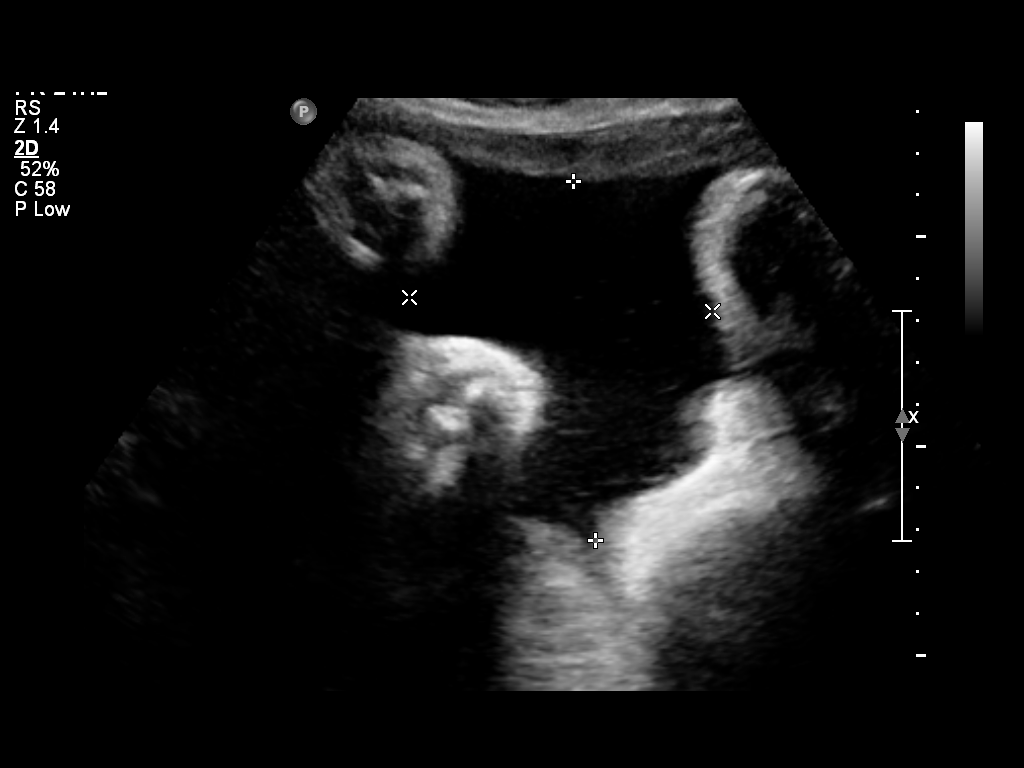
[im 4/8]
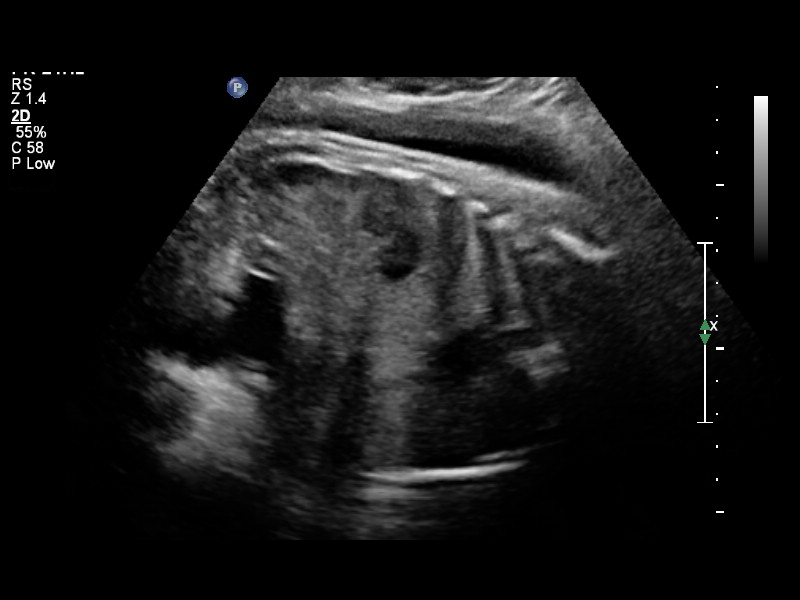
[im 5/8]
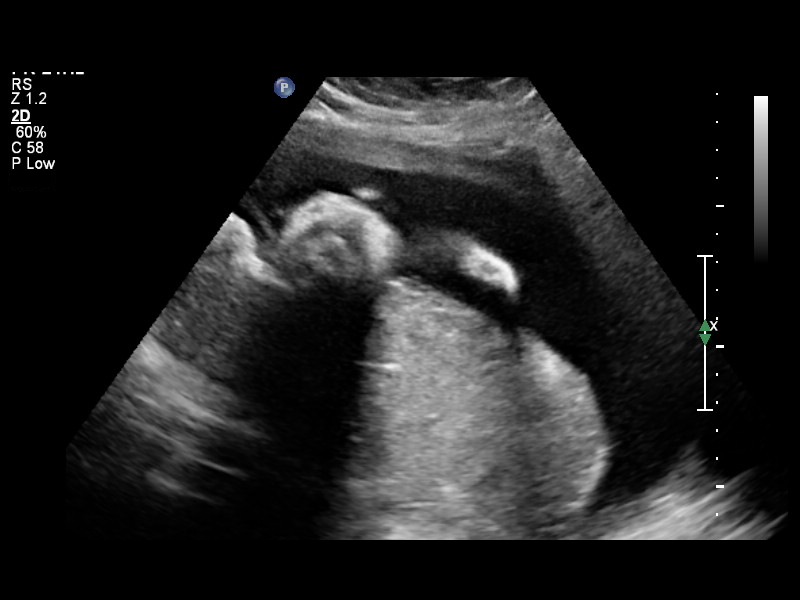
[im 6/8]
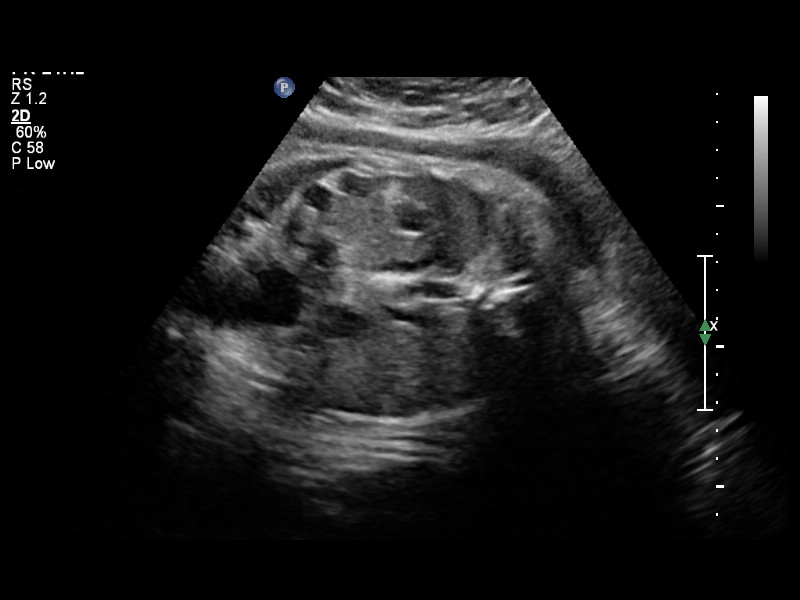
[im 7/8]
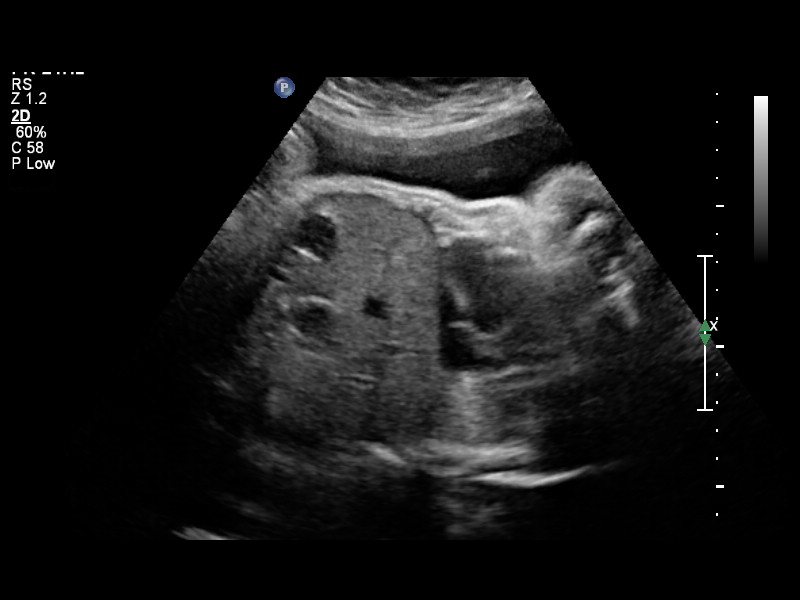
[im 8/8]
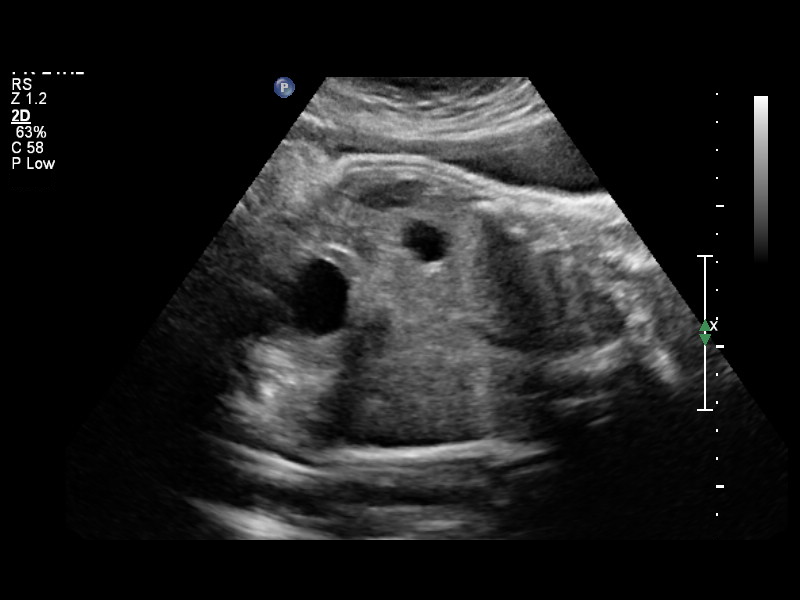

[8 of 8 positions shown; findings below may reference images not displayed]

OBSTETRICS REPORT
                      (Signed Final 06/19/2011 [DATE])

 Order#:         [id number]
Procedures

Indications

 Assess fetal well being
 Chronic hypertension
 Increased BMI  (393 lbs.)
 Polyhydramnios
Fetal Evaluation

 Fetal Heart Rate:  135                         bpm
 Cardiac Activity:  Observed
 Presentation:      Cephalic
 Placenta:          Posterior, above cervical
                    os

 Amniotic Fluid
 AFI FV:      Polyhydramnios
                                             Larg Pckt:     8.6  cm
Biophysical Evaluation

 Amniotic F.V:   Polyhydramnios             F. Tone:        Observed
 F. Movement:    Observed                   Score:          [DATE]
 F. Breathing:   Not Observed
Gestational Age

 Clinical EDD:  36w 4d                                        EDD:   07/12/11
 Best:          36w 4d    Det. By:   Clinical EDD             EDD:   07/12/11
Impression

 Single live IUP in cephalic presentation.  BPP [DATE] (breathing
 not observed).
 Subjectively and quantitatively increased amniotic fluid
 volume.

## 2012-09-23 ENCOUNTER — Encounter: Payer: Medicaid Other | Admitting: Advanced Practice Midwife

## 2012-09-30 ENCOUNTER — Ambulatory Visit (INDEPENDENT_AMBULATORY_CARE_PROVIDER_SITE_OTHER): Payer: Medicaid Other | Admitting: Obstetrics & Gynecology

## 2012-09-30 ENCOUNTER — Encounter: Payer: Self-pay | Admitting: *Deleted

## 2012-09-30 VITALS — BP 138/80 | Wt >= 6400 oz

## 2012-09-30 DIAGNOSIS — Z23 Encounter for immunization: Secondary | ICD-10-CM

## 2012-09-30 DIAGNOSIS — O10019 Pre-existing essential hypertension complicating pregnancy, unspecified trimester: Secondary | ICD-10-CM

## 2012-09-30 DIAGNOSIS — S99921A Unspecified injury of right foot, initial encounter: Secondary | ICD-10-CM

## 2012-09-30 DIAGNOSIS — O34219 Maternal care for unspecified type scar from previous cesarean delivery: Secondary | ICD-10-CM

## 2012-09-30 DIAGNOSIS — O10013 Pre-existing essential hypertension complicating pregnancy, third trimester: Secondary | ICD-10-CM

## 2012-09-30 DIAGNOSIS — O4403 Placenta previa specified as without hemorrhage, third trimester: Secondary | ICD-10-CM

## 2012-09-30 DIAGNOSIS — O4400 Placenta previa specified as without hemorrhage, unspecified trimester: Secondary | ICD-10-CM

## 2012-09-30 LAB — POCT URINALYSIS DIP (DEVICE)
Bilirubin Urine: NEGATIVE
Hgb urine dipstick: NEGATIVE
Ketones, ur: NEGATIVE mg/dL
Nitrite: NEGATIVE
Protein, ur: 30 mg/dL — AB
pH: 7 (ref 5.0–8.0)

## 2012-09-30 LAB — CBC
HCT: 28.3 % — ABNORMAL LOW (ref 36.0–46.0)
Hemoglobin: 8.8 g/dL — ABNORMAL LOW (ref 12.0–15.0)
MCH: 23.7 pg — ABNORMAL LOW (ref 26.0–34.0)
MCHC: 31.1 g/dL (ref 30.0–36.0)
RDW: 19.1 % — ABNORMAL HIGH (ref 11.5–15.5)

## 2012-09-30 MED ORDER — TETANUS-DIPHTH-ACELL PERTUSSIS 5-2.5-18.5 LF-MCG/0.5 IM SUSP
0.5000 mL | Freq: Once | INTRAMUSCULAR | Status: AC
  Administered 2012-09-30: 0.5 mL via INTRAMUSCULAR

## 2012-09-30 NOTE — Progress Notes (Signed)
U/S and x-ray scheduled 10/04/12 at 215pm.

## 2012-09-30 NOTE — Progress Notes (Signed)
Pulse: 108 Has some swelling in her ankle. 28 week labs today. 1hr due at 0930

## 2012-09-30 NOTE — Progress Notes (Signed)
Followup scan ordered for growth and low-lying placenta.  Normal BP.  1 hr GTT, labs today.  TDaP vaccine given today.  Patient reports right ankle pain s/p fall a few weeks ago, no tenderness or anomaly on exam, foot X ray ordered.  Routine pregnancy work restrictions letter given to patient.  Also has abscess in the fold of skin on her right flank; she is on Keflex for this, recommended warm compresses for it.  No other complaints or concerns.  Fetal movement and labor precautions reviewed.

## 2012-09-30 NOTE — Patient Instructions (Signed)
Return to clinic for any obstetric concerns or go to [facility] for evaluation  

## 2012-10-01 ENCOUNTER — Telehealth: Payer: Self-pay

## 2012-10-01 ENCOUNTER — Inpatient Hospital Stay (HOSPITAL_COMMUNITY)
Admission: AD | Admit: 2012-10-01 | Discharge: 2012-10-01 | Disposition: A | Discharge status: Home / Self Care | Payer: Medicaid Other | Source: Ambulatory Visit | Attending: Obstetrics & Gynecology | Admitting: Obstetrics & Gynecology

## 2012-10-01 ENCOUNTER — Encounter (HOSPITAL_COMMUNITY): Payer: Self-pay | Admitting: *Deleted

## 2012-10-01 ENCOUNTER — Encounter: Payer: Self-pay | Admitting: Obstetrics & Gynecology

## 2012-10-01 DIAGNOSIS — N859 Noninflammatory disorder of uterus, unspecified: Secondary | ICD-10-CM

## 2012-10-01 DIAGNOSIS — O10012 Pre-existing essential hypertension complicating pregnancy, second trimester: Secondary | ICD-10-CM

## 2012-10-01 DIAGNOSIS — O99891 Other specified diseases and conditions complicating pregnancy: Secondary | ICD-10-CM | POA: Insufficient documentation

## 2012-10-01 DIAGNOSIS — L02219 Cutaneous abscess of trunk, unspecified: Secondary | ICD-10-CM | POA: Insufficient documentation

## 2012-10-01 DIAGNOSIS — L732 Hidradenitis suppurativa: Secondary | ICD-10-CM | POA: Insufficient documentation

## 2012-10-01 DIAGNOSIS — O99013 Anemia complicating pregnancy, third trimester: Secondary | ICD-10-CM | POA: Insufficient documentation

## 2012-10-01 DIAGNOSIS — O99019 Anemia complicating pregnancy, unspecified trimester: Secondary | ICD-10-CM

## 2012-10-01 DIAGNOSIS — O99810 Abnormal glucose complicating pregnancy: Secondary | ICD-10-CM | POA: Insufficient documentation

## 2012-10-01 DIAGNOSIS — O34219 Maternal care for unspecified type scar from previous cesarean delivery: Secondary | ICD-10-CM

## 2012-10-01 DIAGNOSIS — O99012 Anemia complicating pregnancy, second trimester: Secondary | ICD-10-CM | POA: Insufficient documentation

## 2012-10-01 DIAGNOSIS — O4700 False labor before 37 completed weeks of gestation, unspecified trimester: Secondary | ICD-10-CM | POA: Insufficient documentation

## 2012-10-01 DIAGNOSIS — L03319 Cellulitis of trunk, unspecified: Secondary | ICD-10-CM | POA: Insufficient documentation

## 2012-10-01 HISTORY — DX: Gestational (pregnancy-induced) hypertension without significant proteinuria, unspecified trimester: O13.9

## 2012-10-01 LAB — COMPREHENSIVE METABOLIC PANEL
AST: 12 U/L (ref 0–37)
Alkaline Phosphatase: 75 U/L (ref 39–117)
BUN: 7 mg/dL (ref 6–23)
Creat: 0.5 mg/dL (ref 0.50–1.10)
Glucose, Bld: 153 mg/dL — ABNORMAL HIGH (ref 70–99)
Total Bilirubin: 0.2 mg/dL — ABNORMAL LOW (ref 0.3–1.2)

## 2012-10-01 LAB — GLUCOSE TOLERANCE, 1 HOUR (50G) W/O FASTING: Glucose, 1 Hour GTT: 157 mg/dL — ABNORMAL HIGH (ref 70–140)

## 2012-10-01 LAB — HIV ANTIBODY (ROUTINE TESTING W REFLEX): HIV: NONREACTIVE

## 2012-10-01 LAB — FETAL FIBRONECTIN: Fetal Fibronectin: NEGATIVE

## 2012-10-01 MED ORDER — ACETAMINOPHEN 325 MG PO TABS
650.0000 mg | ORAL_TABLET | Freq: Once | ORAL | Status: AC
  Administered 2012-10-01: 650 mg via ORAL
  Filled 2012-10-01: qty 2

## 2012-10-01 MED ORDER — NIFEDIPINE 10 MG PO CAPS
20.0000 mg | ORAL_CAPSULE | Freq: Once | ORAL | Status: AC
  Administered 2012-10-01: 20 mg via ORAL
  Filled 2012-10-01: qty 1
  Filled 2012-10-01: qty 2

## 2012-10-01 MED ORDER — SULFAMETHOXAZOLE-TRIMETHOPRIM 800-160 MG PO TABS: 1.0000 | ORAL_TABLET | Freq: Two times a day (BID) | ORAL | Status: DC

## 2012-10-01 MED ORDER — NIFEDIPINE 10 MG PO CAPS
10.0000 mg | ORAL_CAPSULE | Freq: Once | ORAL | Status: AC
  Administered 2012-10-01: 10 mg via ORAL
  Filled 2012-10-01: qty 1

## 2012-10-01 MED ORDER — HYDROCODONE-ACETAMINOPHEN 10-500 MG PO TABS: 1.0000 | ORAL_TABLET | Freq: Four times a day (QID) | ORAL | Status: DC | PRN

## 2012-10-01 MED ORDER — SULFAMETHOXAZOLE-TMP DS 800-160 MG PO TABS
1.0000 | ORAL_TABLET | Freq: Once | ORAL | Status: AC
  Administered 2012-10-01: 1 via ORAL
  Filled 2012-10-01: qty 1

## 2012-10-01 NOTE — [facility] Note (Signed)
Patient states she has had an abscess in the abdominal crease on the right side for three days. Becoming more painful. Was seen at [facility] yesterday and given antibiotics. Denies any problems with this pregnancy. Reports good fetal movement, no bleeding, leaking or contractions.

## 2012-10-01 NOTE — [facility] Note (Signed)
o left on , pt states feels cramping at time

## 2012-10-01 NOTE — MAU Provider Note (Signed)
History     CSN: [id number]  Arrival date and time: 10/01/12 1436   First Provider Initiated Contact with Patient 10/01/12 1523      Chief Complaint  Patient presents with  . Abscess   HPI This is a 22 y.o. female at [redacted]w[redacted]d who presents with c/o pain from abscess on her right side. 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  Is currently on Keflex.  Not getting better. Has been to Derm once and he told her to use antibiotics and drainage as needed.   Also noted to have preterm contractions, but she barely feels them. No history of PTL  OB History   Grav Para Term Preterm Abortions TAB SAB Ect Mult Living   4 2 2  0 1 0 1 0 0 2      Past Medical History  Diagnosis Date  . Hypertension   . MRSA (methicillin resistant Staphylococcus aureus) colonization   . Boil     on side  . Pregnancy induced hypertension     Past Surgical History  Procedure Laterality Date  . Eye surgery    . Cesarean section    . Cesarean section  06/22/2011    Procedure: CESAREAN SECTION;  Surgeon: Hal Morales, MD;  Location: [facility];  Service: Gynecology;  Laterality: N/A;  MRSA positive, Latex Allergy    Family History  Problem Relation Age of Onset  . Diabetes Mother   . Diabetes Maternal Aunt   . Diabetes Father   . Heart disease Father   . Cancer Maternal Grandmother     History  Substance Use Topics  . Smoking status: Never Smoker   . Smokeless tobacco: Never Used  . Alcohol Use: No    Allergies:  Allergies  Allergen Reactions  . Latex Hives and Itching  . Pork-Derived Products Other (See Comments)    Religous reasons does not eat    Prescriptions prior to admission  Medication Sig Dispense Refill  . acetaminophen (TYLENOL) 500 MG tablet Take 1,000 mg by mouth every 6 (six) hours as needed. pain      . cephALEXin (KEFLEX) 500 MG capsule Take 1 capsule (500 mg total) by mouth 4 (four) times daily.  28 capsule  0  . labetalol (NORMODYNE) 200 MG  tablet Take 1 tablet (200 mg total) by mouth 2 (two) times daily.  60 tablet  3  . Prenatal Multivit-Min-Fe-FA (PRENATAL VITAMINS) 0.8 MG tablet Take 1 tablet by mouth daily.  30 tablet  12    Review of Systems  Constitutional: Negative for fever, chills and malaise/fatigue.  Gastrointestinal: Negative for nausea, vomiting, abdominal pain (pain on skin of right abdomen), diarrhea and constipation.  Neurological: Negative for weakness and headaches.   Physical Exam   Blood pressure 148/82, pulse 100, temperature 98 F (36.7 C), temperature source Oral, resp. rate 20, height 5' 3.5" (1.613 m), weight 406 lb 3.2 oz (184.251 kg), last menstrual period 03/13/2012, SpO2 97.00%.  Physical Exam  Constitutional: She is oriented to person, place, and time. She appears well-developed and well-nourished. No distress.  HENT:  Head: Normocephalic.  Cardiovascular: Normal rate.   Respiratory: Effort normal.  GI: Soft. She exhibits no distension. There is no tenderness. There is no rebound and no guarding.  Genitourinary: Vagina normal and uterus normal. No vaginal discharge found.  Musculoskeletal: Normal range of motion.  Neurological: She is alert and oriented to person, place, and time.  Skin: Skin is warm and dry. Lesion (Cellulitis  with some central softening on Right side of skin roll) noted.     Psychiatric: She has a normal mood and affect.   Fetal heart rate reactive Uterine contractions noted every 3-4 minutes Dilation: Closed Effacement (%): Thick Station:  (hgih) Exam by:: Mayford Knife CNM  FFn collected  MAU Course  Procedures  MDM Gave two doses of Procardia.  UCs stopped after second dose.   FFn Negative  Assessment and Plan  A:  [facility] at [redacted]w[redacted]d       Hydradenitis Suppuritiva      Abscess of right abdominal skin fold      Preterm contractions with no cervical change and negative FFn  P:  Discharge home       Will change antibiotic to Septra DS for better coverage        PTL precautions.         [facility] 10/01/2012, 4:38 PM

## 2012-10-01 NOTE — Telephone Encounter (Signed)
Was seen in the office yesterday for a routine prenatal visit.  Patient has an abscess under a skin fold on her right flank.  She was RX keflex for the abscess.  She called in reporting severe pain where the abscess is.  She rates it on a scale of 8-9.  She would like some medication for this.  Patient is [redacted]w[redacted]d pregnant.

## 2012-10-01 NOTE — MAU Note (Signed)
Been on rx for boil, pain is increasing

## 2012-10-02 ENCOUNTER — Telehealth: Payer: Self-pay

## 2012-10-02 NOTE — Telephone Encounter (Signed)
Called patient to inform her of the need for a 3 hr. Gtt.  Spoke with a female who said Krystal Hartman would not be available until 1:00pm today.  I left a message with her to have her call us back at the clinic and also that if she got our voicemail to please let us know if we have permission to leave a detailed message on her voicemail.  She is able to come in tomorrow morning at 7:30, fasting, for her 3 hr glucose.

## 2012-10-02 NOTE — Telephone Encounter (Signed)
Message copied by Trudie Reed on Wed Oct 02, 2012  8:56 AM ------      Message from: Jaynie Collins A      Created: Tue Oct 01, 2012 12:20 PM       1 hr GTT 157.  Please schedule for 3 hr GTT.  Patient also has chronic anemia, ensure she is taking iron supplements (ferrous sulfate 325 mg po tid, this is over the counter).  Check anemia panel (ask Tobi Bastos) when she returns for 3 hour GTT; draw with the fasting sample. ------

## 2012-10-02 NOTE — Telephone Encounter (Signed)
I spoke with patient and she stated that she went to [facility]U yesterday and had this taken care of.

## 2012-10-02 NOTE — Telephone Encounter (Signed)
Opened in error

## 2012-10-02 NOTE — Telephone Encounter (Signed)
I spoke with patient and she stated that she cannot come tomorrow but she can come on Monday 10/07/12. I asked to to arrive at 0800 and be npo after midnight. Pt agreed.

## 2012-10-03 ENCOUNTER — Other Ambulatory Visit: Payer: Medicaid Other

## 2012-10-03 ENCOUNTER — Encounter: Payer: Self-pay | Admitting: Family

## 2012-10-03 NOTE — MAU Provider Note (Signed)
Attestation of Attending Supervision of Advanced Practitioner (CNM/NP): Evaluation and management procedures were performed by the Advanced Practitioner under my supervision and collaboration.  I have reviewed the Advanced Practitioner's note and chart, and I agree with the management and plan.  HARRAWAY-SMITH, Shera Laubach 2:57 PM     

## 2012-10-04 ENCOUNTER — Ambulatory Visit (HOSPITAL_COMMUNITY)
Admission: RE | Admit: 2012-10-04 | Discharge: 2012-10-04 | Disposition: A | Discharge status: Home / Self Care | Payer: Medicaid Other | Source: Ambulatory Visit | Attending: Obstetrics & Gynecology | Admitting: Obstetrics & Gynecology

## 2012-10-04 DIAGNOSIS — O10013 Pre-existing essential hypertension complicating pregnancy, third trimester: Secondary | ICD-10-CM

## 2012-10-04 DIAGNOSIS — O139 Gestational [pregnancy-induced] hypertension without significant proteinuria, unspecified trimester: Secondary | ICD-10-CM | POA: Insufficient documentation

## 2012-10-04 DIAGNOSIS — E669 Obesity, unspecified: Secondary | ICD-10-CM | POA: Insufficient documentation

## 2012-10-04 DIAGNOSIS — S99921A Unspecified injury of right foot, initial encounter: Secondary | ICD-10-CM

## 2012-10-04 DIAGNOSIS — O4400 Placenta previa specified as without hemorrhage, unspecified trimester: Secondary | ICD-10-CM | POA: Insufficient documentation

## 2012-10-05 ENCOUNTER — Encounter: Payer: Self-pay | Admitting: Obstetrics & Gynecology

## 2012-10-07 ENCOUNTER — Other Ambulatory Visit: Payer: Medicaid Other

## 2012-10-07 ENCOUNTER — Encounter: Payer: Self-pay | Admitting: Obstetrics and Gynecology

## 2012-10-07 DIAGNOSIS — R7309 Other abnormal glucose: Secondary | ICD-10-CM

## 2012-10-08 ENCOUNTER — Encounter: Payer: Self-pay | Admitting: *Deleted

## 2012-10-08 ENCOUNTER — Telehealth: Payer: Self-pay | Admitting: *Deleted

## 2012-10-08 LAB — GLUCOSE TOLERANCE, 3 HOURS
Glucose Tolerance, 1 hour: 154 mg/dL (ref 70–189)
Glucose Tolerance, 2 hour: 148 mg/dL (ref 70–164)
Glucose Tolerance, Fasting: 88 mg/dL (ref 70–104)

## 2012-10-08 NOTE — Telephone Encounter (Addendum)
Message copied by Jill Side on Tue Oct 08, 2012  9:23 AM ------      Message from: Jaynie Collins A      Created: Sat Oct 05, 2012 10:40 AM       Normal right foot X ray.  Pes planus is just flat feet, she has had this for a long time on both feet.  Soft tissue swelling consistent with edema in pregnancy. No need for any intervention. ------      Called pt and left message to call us back for test result information. Please leave a message stating if it is ok to leave her results on her voice mail.

## 2012-10-09 ENCOUNTER — Encounter: Payer: Self-pay | Admitting: *Deleted

## 2012-10-09 NOTE — Telephone Encounter (Signed)
Called pt and left message that we are trying to reach to please give the clinics a call back.

## 2012-10-10 ENCOUNTER — Telehealth: Payer: Self-pay

## 2012-10-10 NOTE — Telephone Encounter (Signed)
Patient called back and stated to call her back at the [phone] number. Called patient back and informed her that i did not anticipate a doctor would write her out of work just because she has a high risk pregnancy and that that was not enough of a reason to be out of work for the rest of her pregnancy but that she could discuss her concerns at her appt on 4/28. Patient asked about 3 hr gtt results. I told the patient they came back normal. Patient verbalized understanding and had no further questions

## 2012-10-10 NOTE — Telephone Encounter (Signed)
Patient called and left message stating she would like a doctors note to be out of work, she knows the x-rays are normal but she is still having problems standing on her feet at work and has a lot of sweating and sometimes feels like she is going to faint and she talked to her Child psychotherapist about what's been going on and was told to call and talk to [place] since she has a high risk pregnancy and everything- she needs a note to be out of work the rest of pregnancy. Called patient back at home number- no answer left message stating we are returning her phone call and to please give [place] a call back.

## 2012-10-10 NOTE — Telephone Encounter (Signed)
Patient called and left a message this morning that it was ok for [place] to leave a detailed message on her voicemail.  I called the patient this morning to inform her of her xray results.  I got her voicemail and left her a message telling her that her foot xray came back normal and the swelling was related to the pregnancy and that there is no need for any intervention.

## 2012-10-14 ENCOUNTER — Encounter: Payer: Self-pay | Admitting: Obstetrics and Gynecology

## 2012-10-14 ENCOUNTER — Ambulatory Visit (INDEPENDENT_AMBULATORY_CARE_PROVIDER_SITE_OTHER): Payer: Medicaid Other | Admitting: Obstetrics and Gynecology

## 2012-10-14 ENCOUNTER — Other Ambulatory Visit: Payer: Self-pay | Admitting: Obstetrics and Gynecology

## 2012-10-14 VITALS — BP 130/83 | Temp 96.4°F | Wt >= 6400 oz

## 2012-10-14 DIAGNOSIS — O3660X0 Maternal care for excessive fetal growth, unspecified trimester, not applicable or unspecified: Secondary | ICD-10-CM

## 2012-10-14 DIAGNOSIS — O3663X1 Maternal care for excessive fetal growth, third trimester, fetus 1: Secondary | ICD-10-CM

## 2012-10-14 LAB — POCT URINALYSIS DIP (DEVICE)
Bilirubin Urine: NEGATIVE
Glucose, UA: NEGATIVE mg/dL
Hgb urine dipstick: NEGATIVE
Nitrite: NEGATIVE
Urobilinogen, UA: 0.2 mg/dL (ref 0.0–1.0)

## 2012-10-14 NOTE — Addendum Note (Signed)
Addended by: Caren Griffins C on: 10/14/2012 10:12 AM   Modules accepted: Orders

## 2012-10-14 NOTE — Progress Notes (Signed)
Suspect LGA> [facility]CB[facility] next visit. Ankle injury> work excuse x 1 wk. Discussed Mirena.

## 2012-10-14 NOTE — Patient Instructions (Signed)
Pregnancy - Third Trimester  The third trimester of pregnancy (the last 3 months) is a period of the most rapid growth for you and your baby. The baby approaches a length of 20 inches and a weight of 6 to 10 pounds. The baby is adding on fat and getting ready for life outside your body. While inside, babies have periods of sleeping and waking, suck their thumbs, and hiccups. You can often feel small contractions of the uterus. This is false labor. It is also called Braxton-Hicks contractions. This is like a practice for labor. The usual problems in this stage of pregnancy include more difficulty breathing, swelling of the hands and feet from water retention, and having to urinate more often because of the uterus and baby pressing on your bladder.   PRENATAL EXAMS  · Blood work may continue to be done during prenatal exams. These tests are done to check on your health and the probable health of your baby. Blood work is used to follow your blood levels (hemoglobin). Anemia (low hemoglobin) is common during pregnancy. Iron and vitamins are given to help prevent this. You may also continue to be checked for diabetes. Some of the past blood tests may be done again.  · The size of the uterus is measured during each visit. This makes sure your baby is growing properly according to your pregnancy dates.  · Your blood pressure is checked every prenatal visit. This is to make sure you are not getting toxemia.  · Your urine is checked every prenatal visit for infection, diabetes and protein.  · Your weight is checked at each visit. This is done to make sure gains are happening at the suggested rate and that you and your baby are growing normally.  · Sometimes, an ultrasound is performed to confirm the position and the proper growth and development of the baby. This is a test done that bounces harmless sound waves off the baby so your caregiver can more accurately determine due dates.  · Discuss the type of pain medication and  anesthesia you will have during your labor and delivery.  · Discuss the possibility and anesthesia if a Cesarean Section might be necessary.  · Inform your caregiver if there is any mental or physical violence at home.  Sometimes, a specialized non-stress test, contraction stress test and biophysical profile are done to make sure the baby is not having a problem. Checking the amniotic fluid surrounding the baby is called an amniocentesis. The amniotic fluid is removed by sticking a needle into the belly (abdomen). This is sometimes done near the end of pregnancy if an early delivery is required. In this case, it is done to help make sure the baby's lungs are mature enough for the baby to live outside of the womb. If the lungs are not mature and it is unsafe to deliver the baby, an injection of cortisone medication is given to the mother 1 to 2 days before the delivery. This helps the baby's lungs mature and makes it safer to deliver the baby.  CHANGES OCCURING IN THE THIRD TRIMESTER OF PREGNANCY  Your body goes through many changes during pregnancy. They vary from person to person. Talk to your caregiver about changes you notice and are concerned about.  · During the last trimester, you have probably had an increase in your appetite. It is normal to have cravings for certain foods. This varies from person to person and pregnancy to pregnancy.  · You may begin to   get stretch marks on your hips, abdomen, and breasts. These are normal changes in the body during pregnancy. There are no exercises or medications to take which prevent this change.  · Constipation may be treated with a stool softener or adding bulk to your diet. Drinking lots of fluids, fiber in vegetables, fruits, and whole grains are helpful.  · Exercising is also helpful. If you have been very active up until your pregnancy, most of these activities can be continued during your pregnancy. If you have been less active, it is helpful to start an exercise  program such as walking. Consult your caregiver before starting exercise programs.  · Avoid all smoking, alcohol, un-prescribed drugs, herbs and "street drugs" during your pregnancy. These chemicals affect the formation and growth of the baby. Avoid chemicals throughout the pregnancy to ensure the delivery of a healthy infant.  · Backache, varicose veins and hemorrhoids may develop or get worse.  · You will tire more easily in the third trimester, which is normal.  · The baby's movements may be stronger and more often.  · You may become short of breath easily.  · Your belly button may stick out.  · A yellow discharge may leak from your breasts called colostrum.  · You may have a bloody mucus discharge. This usually occurs a few days to a week before labor begins.  HOME CARE INSTRUCTIONS   · Keep your caregiver's appointments. Follow your caregiver's instructions regarding medication use, exercise, and diet.  · During pregnancy, you are providing food for you and your baby. Continue to eat regular, well-balanced meals. Choose foods such as meat, fish, milk and other low fat dairy products, vegetables, fruits, and whole-grain breads and cereals. Your caregiver will tell you of the ideal weight gain.  · A physical sexual relationship may be continued throughout pregnancy if there are no other problems such as early (premature) leaking of amniotic fluid from the membranes, vaginal bleeding, or belly (abdominal) pain.  · Exercise regularly if there are no restrictions. Check with your caregiver if you are unsure of the safety of your exercises. Greater weight gain will occur in the last 2 trimesters of pregnancy. Exercising helps:  · Control your weight.  · Get you in shape for labor and delivery.  · You lose weight after you deliver.  · Rest a lot with legs elevated, or as needed for leg cramps or low back pain.  · Wear a good support or jogging bra for breast tenderness during pregnancy. This may help if worn during  sleep. Pads or tissues may be used in the bra if you are leaking colostrum.  · Do not use hot tubs, steam rooms, or saunas.  · Wear your seat belt when driving. This protects you and your baby if you are in an accident.  · Avoid raw meat, cat litter boxes and soil used by cats. These carry germs that can cause birth defects in the baby.  · It is easier to loose urine during pregnancy. Tightening up and strengthening the pelvic muscles will help with this problem. You can practice stopping your urination while you are going to the bathroom. These are the same muscles you need to strengthen. It is also the muscles you would use if you were trying to stop from passing gas. You can practice tightening these muscles up 10 times a set and repeating this about 3 times per day. Once you know what muscles to tighten up, do not perform these   exercises during urination. It is more likely to cause an infection by backing up the urine.  · Ask for help if you have financial, counseling or nutritional needs during pregnancy. Your caregiver will be able to offer counseling for these needs as well as refer you for other special needs.  · Make a list of emergency phone numbers and have them available.  · Plan on getting help from family or friends when you go home from the hospital.  · Make a trial run to the hospital.  · Take prenatal classes with the father to understand, practice and ask questions about the labor and delivery.  · Prepare the baby's room/nursery.  · Do not travel out of the city unless it is absolutely necessary and with the advice of your caregiver.  · Wear only low or no heal shoes to have better balance and prevent falling.  MEDICATIONS AND DRUG USE IN PREGNANCY  · Take prenatal vitamins as directed. The vitamin should contain 1 milligram of folic acid. Keep all vitamins out of reach of children. Only a couple vitamins or tablets containing iron may be fatal to a baby or young child when ingested.  · Avoid use  of all medications, including herbs, over-the-counter medications, not prescribed or suggested by your caregiver. Only take over-the-counter or prescription medicines for pain, discomfort, or fever as directed by your caregiver. Do not use aspirin, ibuprofen (Motrin®, Advil®, Nuprin®) or naproxen (Aleve®) unless OK'd by your caregiver.  · Let your caregiver also know about herbs you may be using.  · Alcohol is related to a number of birth defects. This includes fetal alcohol syndrome. All alcohol, in any form, should be avoided completely. Smoking will cause low birth rate and premature babies.  · Street/illegal drugs are very harmful to the baby. They are absolutely forbidden. A baby born to an addicted mother will be addicted at birth. The baby will go through the same withdrawal an adult does.  SEEK MEDICAL CARE IF:  You have any concerns or worries during your pregnancy. It is better to call with your questions if you feel they cannot wait, rather than worry about them.  DECISIONS ABOUT CIRCUMCISION  You may or may not know the sex of your baby. If you know your baby is a boy, it may be time to think about circumcision. Circumcision is the removal of the foreskin of the penis. This is the skin that covers the sensitive end of the penis. There is no proven medical need for this. Often this decision is made on what is popular at the time or based upon religious beliefs and social issues. You can discuss these issues with your caregiver or pediatrician.  SEEK IMMEDIATE MEDICAL CARE IF:   · An unexplained oral temperature above 102° F (38.9° C) develops, or as your caregiver suggests.  · You have leaking of fluid from the vagina (birth canal). If leaking membranes are suspected, take your temperature and tell your caregiver of this when you call.  · There is vaginal spotting, bleeding or passing clots. Tell your caregiver of the amount and how many pads are used.  · You develop a bad smelling vaginal discharge with  a change in the color from clear to white.  · You develop vomiting that lasts more than 24 hours.  · You develop chills or fever.  · You develop shortness of breath.  · You develop burning on urination.  · You loose more than 2 pounds of weight   or gain more than 2 pounds of weight or as suggested by your caregiver.  · You notice sudden swelling of your face, hands, and feet or legs.  · You develop belly (abdominal) pain. Round ligament discomfort is a common non-cancerous (benign) cause of abdominal pain in pregnancy. Your caregiver still must evaluate you.  · You develop a severe headache that does not go away.  · You develop visual problems, blurred or double vision.  · If you have not felt your baby move for more than 1 hour. If you think the baby is not moving as much as usual, eat something with sugar in it and lie down on your left side for an hour. The baby should move at least 4 to 5 times per hour. Call right away if your baby moves less than that.  · You fall, are in a car accident or any kind of trauma.  · There is mental or physical violence at home.  Document Released: 05/30/2001 Document Revised: 08/28/2011 Document Reviewed: 12/02/2008  [employer]® Patient Information ©2013 [employer].

## 2012-10-21 ENCOUNTER — Other Ambulatory Visit: Payer: Self-pay | Admitting: Obstetrics & Gynecology

## 2012-10-21 ENCOUNTER — Ambulatory Visit (INDEPENDENT_AMBULATORY_CARE_PROVIDER_SITE_OTHER): Payer: Medicaid Other | Admitting: Obstetrics & Gynecology

## 2012-10-21 VITALS — BP 135/88 | Temp 98.1°F | Wt >= 6400 oz

## 2012-10-21 DIAGNOSIS — O10013 Pre-existing essential hypertension complicating pregnancy, third trimester: Secondary | ICD-10-CM

## 2012-10-21 DIAGNOSIS — O10019 Pre-existing essential hypertension complicating pregnancy, unspecified trimester: Secondary | ICD-10-CM

## 2012-10-21 LAB — POCT URINALYSIS DIP (DEVICE)
Bilirubin Urine: NEGATIVE
Nitrite: NEGATIVE
Protein, ur: 30 mg/dL — AB
pH: 7 (ref 5.0–8.0)

## 2012-10-21 MED ORDER — DICLOFENAC SODIUM 75 MG PO TBEC: 75.0000 mg | DELAYED_RELEASE_TABLET | Freq: Two times a day (BID) | ORAL | Status: DC

## 2012-10-21 NOTE — Progress Notes (Signed)
3 hr GTT only one abnl value. Toothache right side, needs to see dentist. Ankle pain right, swelling, need ortho referral

## 2012-10-21 NOTE — Patient Instructions (Signed)
Pregnancy - Third Trimester  The third trimester of pregnancy (the last 3 months) is a period of the most rapid growth for you and your baby. The baby approaches a length of 20 inches and a weight of 6 to 10 pounds. The baby is adding on fat and getting ready for life outside your body. While inside, babies have periods of sleeping and waking, suck their thumbs, and hiccups. You can often feel small contractions of the uterus. This is false labor. It is also called Braxton-Hicks contractions. This is like a practice for labor. The usual problems in this stage of pregnancy include more difficulty breathing, swelling of the hands and feet from water retention, and having to urinate more often because of the uterus and baby pressing on your bladder.   PRENATAL EXAMS  · Blood work may continue to be done during prenatal exams. These tests are done to check on your health and the probable health of your baby. Blood work is used to follow your blood levels (hemoglobin). Anemia (low hemoglobin) is common during pregnancy. Iron and vitamins are given to help prevent this. You may also continue to be checked for diabetes. Some of the past blood tests may be done again.  · The size of the uterus is measured during each visit. This makes sure your baby is growing properly according to your pregnancy dates.  · Your blood pressure is checked every prenatal visit. This is to make sure you are not getting toxemia.  · Your urine is checked every prenatal visit for infection, diabetes and protein.  · Your weight is checked at each visit. This is done to make sure gains are happening at the suggested rate and that you and your baby are growing normally.  · Sometimes, an ultrasound is performed to confirm the position and the proper growth and development of the baby. This is a test done that bounces harmless sound waves off the baby so your caregiver can more accurately determine due dates.  · Discuss the type of pain medication and  anesthesia you will have during your labor and delivery.  · Discuss the possibility and anesthesia if a Cesarean Section might be necessary.  · Inform your caregiver if there is any mental or physical violence at home.  Sometimes, a specialized non-stress test, contraction stress test and biophysical profile are done to make sure the baby is not having a problem. Checking the amniotic fluid surrounding the baby is called an amniocentesis. The amniotic fluid is removed by sticking a needle into the belly (abdomen). This is sometimes done near the end of pregnancy if an early delivery is required. In this case, it is done to help make sure the baby's lungs are mature enough for the baby to live outside of the womb. If the lungs are not mature and it is unsafe to deliver the baby, an injection of cortisone medication is given to the mother 1 to 2 days before the delivery. This helps the baby's lungs mature and makes it safer to deliver the baby.  CHANGES OCCURING IN THE THIRD TRIMESTER OF PREGNANCY  Your body goes through many changes during pregnancy. They vary from person to person. Talk to your caregiver about changes you notice and are concerned about.  · During the last trimester, you have probably had an increase in your appetite. It is normal to have cravings for certain foods. This varies from person to person and pregnancy to pregnancy.  · You may begin to   get stretch marks on your hips, abdomen, and breasts. These are normal changes in the body during pregnancy. There are no exercises or medications to take which prevent this change.  · Constipation may be treated with a stool softener or adding bulk to your diet. Drinking lots of fluids, fiber in vegetables, fruits, and whole grains are helpful.  · Exercising is also helpful. If you have been very active up until your pregnancy, most of these activities can be continued during your pregnancy. If you have been less active, it is helpful to start an exercise  program such as walking. Consult your caregiver before starting exercise programs.  · Avoid all smoking, alcohol, un-prescribed drugs, herbs and "street drugs" during your pregnancy. These chemicals affect the formation and growth of the baby. Avoid chemicals throughout the pregnancy to ensure the delivery of a healthy infant.  · Backache, varicose veins and hemorrhoids may develop or get worse.  · You will tire more easily in the third trimester, which is normal.  · The baby's movements may be stronger and more often.  · You may become short of breath easily.  · Your belly button may stick out.  · A yellow discharge may leak from your breasts called colostrum.  · You may have a bloody mucus discharge. This usually occurs a few days to a week before labor begins.  HOME CARE INSTRUCTIONS   · Keep your caregiver's appointments. Follow your caregiver's instructions regarding medication use, exercise, and diet.  · During pregnancy, you are providing food for you and your baby. Continue to eat regular, well-balanced meals. Choose foods such as meat, fish, milk and other low fat dairy products, vegetables, fruits, and whole-grain breads and cereals. Your caregiver will tell you of the ideal weight gain.  · A physical sexual relationship may be continued throughout pregnancy if there are no other problems such as early (premature) leaking of amniotic fluid from the membranes, vaginal bleeding, or belly (abdominal) pain.  · Exercise regularly if there are no restrictions. Check with your caregiver if you are unsure of the safety of your exercises. Greater weight gain will occur in the last 2 trimesters of pregnancy. Exercising helps:  · Control your weight.  · Get you in shape for labor and delivery.  · You lose weight after you deliver.  · Rest a lot with legs elevated, or as needed for leg cramps or low back pain.  · Wear a good support or jogging bra for breast tenderness during pregnancy. This may help if worn during  sleep. Pads or tissues may be used in the bra if you are leaking colostrum.  · Do not use hot tubs, steam rooms, or saunas.  · Wear your seat belt when driving. This protects you and your baby if you are in an accident.  · Avoid raw meat, cat litter boxes and soil used by cats. These carry germs that can cause birth defects in the baby.  · It is easier to loose urine during pregnancy. Tightening up and strengthening the pelvic muscles will help with this problem. You can practice stopping your urination while you are going to the bathroom. These are the same muscles you need to strengthen. It is also the muscles you would use if you were trying to stop from passing gas. You can practice tightening these muscles up 10 times a set and repeating this about 3 times per day. Once you know what muscles to tighten up, do not perform these   exercises during urination. It is more likely to cause an infection by backing up the urine.  · Ask for help if you have financial, counseling or nutritional needs during pregnancy. Your caregiver will be able to offer counseling for these needs as well as refer you for other special needs.  · Make a list of emergency phone numbers and have them available.  · Plan on getting help from family or friends when you go home from the hospital.  · Make a trial run to the hospital.  · Take prenatal classes with the father to understand, practice and ask questions about the labor and delivery.  · Prepare the baby's room/nursery.  · Do not travel out of the city unless it is absolutely necessary and with the advice of your caregiver.  · Wear only low or no heal shoes to have better balance and prevent falling.  MEDICATIONS AND DRUG USE IN PREGNANCY  · Take prenatal vitamins as directed. The vitamin should contain 1 milligram of folic acid. Keep all vitamins out of reach of children. Only a couple vitamins or tablets containing iron may be fatal to a baby or young child when ingested.  · Avoid use  of all medications, including herbs, over-the-counter medications, not prescribed or suggested by your caregiver. Only take over-the-counter or prescription medicines for pain, discomfort, or fever as directed by your caregiver. Do not use aspirin, ibuprofen (Motrin®, Advil®, Nuprin®) or naproxen (Aleve®) unless OK'd by your caregiver.  · Let your caregiver also know about herbs you may be using.  · Alcohol is related to a number of birth defects. This includes fetal alcohol syndrome. All alcohol, in any form, should be avoided completely. Smoking will cause low birth rate and premature babies.  · Street/illegal drugs are very harmful to the baby. They are absolutely forbidden. A baby born to an addicted mother will be addicted at birth. The baby will go through the same withdrawal an adult does.  SEEK MEDICAL CARE IF:  You have any concerns or worries during your pregnancy. It is better to call with your questions if you feel they cannot wait, rather than worry about them.  DECISIONS ABOUT CIRCUMCISION  You may or may not know the sex of your baby. If you know your baby is a boy, it may be time to think about circumcision. Circumcision is the removal of the foreskin of the penis. This is the skin that covers the sensitive end of the penis. There is no proven medical need for this. Often this decision is made on what is popular at the time or based upon religious beliefs and social issues. You can discuss these issues with your caregiver or pediatrician.  SEEK IMMEDIATE MEDICAL CARE IF:   · An unexplained oral temperature above 102° F (38.9° C) develops, or as your caregiver suggests.  · You have leaking of fluid from the vagina (birth canal). If leaking membranes are suspected, take your temperature and tell your caregiver of this when you call.  · There is vaginal spotting, bleeding or passing clots. Tell your caregiver of the amount and how many pads are used.  · You develop a bad smelling vaginal discharge with  a change in the color from clear to white.  · You develop vomiting that lasts more than 24 hours.  · You develop chills or fever.  · You develop shortness of breath.  · You develop burning on urination.  · You loose more than 2 pounds of weight   or gain more than 2 pounds of weight or as suggested by your caregiver.  · You notice sudden swelling of your face, hands, and feet or legs.  · You develop belly (abdominal) pain. Round ligament discomfort is a common non-cancerous (benign) cause of abdominal pain in pregnancy. Your caregiver still must evaluate you.  · You develop a severe headache that does not go away.  · You develop visual problems, blurred or double vision.  · If you have not felt your baby move for more than 1 hour. If you think the baby is not moving as much as usual, eat something with sugar in it and lie down on your left side for an hour. The baby should move at least 4 to 5 times per hour. Call right away if your baby moves less than that.  · You fall, are in a car accident or any kind of trauma.  · There is mental or physical violence at home.  Document Released: 05/30/2001 Document Revised: 08/28/2011 Document Reviewed: 12/02/2008  [employer]® Patient Information ©2013 [employer].

## 2012-10-21 NOTE — Progress Notes (Signed)
Dental resource list given. Apppointment made with [facility] for 10/28/12 at 130 pm. Private Ortho appointment made but cancelled as they require her to be self pay.

## 2012-10-21 NOTE — Progress Notes (Signed)
Reports lower abdominal pains and continued right ankle pain.

## 2012-10-22 NOTE — Telephone Encounter (Signed)
Stop taking after 35 weeks

## 2012-10-24 ENCOUNTER — Ambulatory Visit (INDEPENDENT_AMBULATORY_CARE_PROVIDER_SITE_OTHER): Payer: Medicaid Other | Admitting: *Deleted

## 2012-10-24 VITALS — BP 137/62

## 2012-10-24 DIAGNOSIS — O10013 Pre-existing essential hypertension complicating pregnancy, third trimester: Secondary | ICD-10-CM

## 2012-10-24 DIAGNOSIS — O10019 Pre-existing essential hypertension complicating pregnancy, unspecified trimester: Secondary | ICD-10-CM

## 2012-10-24 DIAGNOSIS — O99810 Abnormal glucose complicating pregnancy: Secondary | ICD-10-CM

## 2012-10-24 NOTE — Progress Notes (Signed)
P = 106   1 variable decel during NST- FHR reactive after decel.

## 2012-10-28 ENCOUNTER — Encounter: Payer: Self-pay | Admitting: *Deleted

## 2012-10-28 ENCOUNTER — Other Ambulatory Visit: Payer: Self-pay | Admitting: Obstetrics and Gynecology

## 2012-10-28 ENCOUNTER — Ambulatory Visit (INDEPENDENT_AMBULATORY_CARE_PROVIDER_SITE_OTHER): Payer: Medicaid Other | Admitting: Family Medicine

## 2012-10-28 ENCOUNTER — Encounter: Payer: Self-pay | Admitting: Obstetrics and Gynecology

## 2012-10-28 ENCOUNTER — Ambulatory Visit (INDEPENDENT_AMBULATORY_CARE_PROVIDER_SITE_OTHER): Payer: Medicaid Other | Admitting: Obstetrics and Gynecology

## 2012-10-28 VITALS — BP 152/84 | Ht 64.0 in | Wt >= 6400 oz

## 2012-10-28 VITALS — BP 136/86 | Temp 97.0°F | Wt >= 6400 oz

## 2012-10-28 DIAGNOSIS — O3663X1 Maternal care for excessive fetal growth, third trimester, fetus 1: Secondary | ICD-10-CM

## 2012-10-28 DIAGNOSIS — O909 Complication of the puerperium, unspecified: Secondary | ICD-10-CM

## 2012-10-28 DIAGNOSIS — O99810 Abnormal glucose complicating pregnancy: Secondary | ICD-10-CM

## 2012-10-28 DIAGNOSIS — O10019 Pre-existing essential hypertension complicating pregnancy, unspecified trimester: Secondary | ICD-10-CM

## 2012-10-28 DIAGNOSIS — O3660X0 Maternal care for excessive fetal growth, unspecified trimester, not applicable or unspecified: Secondary | ICD-10-CM

## 2012-10-28 DIAGNOSIS — B009 Herpesviral infection, unspecified: Secondary | ICD-10-CM

## 2012-10-28 DIAGNOSIS — O99019 Anemia complicating pregnancy, unspecified trimester: Secondary | ICD-10-CM

## 2012-10-28 DIAGNOSIS — O10013 Pre-existing essential hypertension complicating pregnancy, third trimester: Secondary | ICD-10-CM

## 2012-10-28 DIAGNOSIS — M25571 Pain in right ankle and joints of right foot: Secondary | ICD-10-CM

## 2012-10-28 DIAGNOSIS — O99013 Anemia complicating pregnancy, third trimester: Secondary | ICD-10-CM

## 2012-10-28 DIAGNOSIS — M25579 Pain in unspecified ankle and joints of unspecified foot: Secondary | ICD-10-CM

## 2012-10-28 DIAGNOSIS — O34219 Maternal care for unspecified type scar from previous cesarean delivery: Secondary | ICD-10-CM

## 2012-10-28 LAB — POCT URINALYSIS DIP (DEVICE)
Glucose, UA: NEGATIVE mg/dL
Protein, ur: 30 mg/dL — AB
Urobilinogen, UA: 0.2 mg/dL (ref 0.0–1.0)

## 2012-10-28 NOTE — Progress Notes (Signed)
HR 100 Still having tooth pain.  State they cant see her until after delivery because she is too far along.

## 2012-10-28 NOTE — Progress Notes (Signed)
NST reviewed and reactive. Patient doing well without any obstetrical complaints. She is complaining of a tooth ache for which she will follow-up with her dentist. FM/PTL precautions reviewed. Plan for repeat c-section- information sent to [place] for scheduling at 39 weeks on 6/25

## 2012-10-28 NOTE — Progress Notes (Signed)
Dental letter given to pt- she will contact her dentist again regarding tooth pain.   AFI scheduled 5/15

## 2012-10-28 NOTE — Assessment & Plan Note (Signed)
Grade 1 right ankle sprain in the patient who has existing pes planus and morbid obesity. Some underlying ankle weakness. We'll start her inversion eversion and plantar flexion dorsiflexion exercises. I gave her prescription for an Aircast if she wants to see if that would help her pain. I don't think she's going to injure further she returns to work there are no work restrictions. I'll see her back in 2-3 weeks and at that time we'll do the next step in her ankle rehabilitation. At some point she might benefit from

## 2012-10-28 NOTE — Progress Notes (Signed)
NST reviewed and reactive.  Angelee Bahr L. Harraway-Smith, M.D., FACOG    

## 2012-10-28 NOTE — Progress Notes (Signed)
  Subjective:    Patient ID: Krystal Hartman, female    DOB: 1990/10/13, 22 y.o.   MRN: [id number]  HPI Right ankle pain for about a month. She tripped while at home and twisted her ankle. She thinks she introverted. Was seen by her primary care provider and had some x-rays done. Pain has not gotten much better. She had used some pain medication for a while. She is pregnant, due to deliver in June.  PERTINENT  PMH / PSH: No history of prior ankle injury or surgery Obesity Currently pregnant Pes planus   Review of Systems Denies numbness or tingling in her foot. No redness of the ankle. No warmth. She has had some soft tissue swelling.    Objective:   Physical Exam I'll signs are reviewed Is GENERAL: Well-developed obese female no acute distress ANKLE: Right. Anterior drawer is normal. Tender to palpation over the medial malleolus and the ACF area. Tenderness is diffuse, not point specific. Dorsiflexion and plantar flexions are intact. Strength is intact in inversion and eversion although she has some pain with both. Small amount of soft tissue swelling laterally. Distally she is neurovascularly intact.       Assessment & Plan:

## 2012-10-30 ENCOUNTER — Other Ambulatory Visit: Payer: Self-pay | Admitting: Obstetrics & Gynecology

## 2012-10-31 ENCOUNTER — Ambulatory Visit (HOSPITAL_COMMUNITY)
Admission: RE | Admit: 2012-10-31 | Discharge: 2012-10-31 | Disposition: A | Discharge status: Home / Self Care | Payer: Medicaid Other | Source: Ambulatory Visit | Attending: Advanced Practice Midwife | Admitting: Advanced Practice Midwife

## 2012-10-31 ENCOUNTER — Ambulatory Visit (INDEPENDENT_AMBULATORY_CARE_PROVIDER_SITE_OTHER): Payer: Medicaid Other | Admitting: *Deleted

## 2012-10-31 ENCOUNTER — Encounter: Payer: Self-pay | Admitting: *Deleted

## 2012-10-31 VITALS — BP 133/63

## 2012-10-31 DIAGNOSIS — O10013 Pre-existing essential hypertension complicating pregnancy, third trimester: Secondary | ICD-10-CM

## 2012-10-31 DIAGNOSIS — O10019 Pre-existing essential hypertension complicating pregnancy, unspecified trimester: Secondary | ICD-10-CM

## 2012-10-31 DIAGNOSIS — Z3201 Encounter for pregnancy test, result positive: Secondary | ICD-10-CM

## 2012-10-31 DIAGNOSIS — Z3689 Encounter for other specified antenatal screening: Secondary | ICD-10-CM | POA: Insufficient documentation

## 2012-10-31 DIAGNOSIS — O139 Gestational [pregnancy-induced] hypertension without significant proteinuria, unspecified trimester: Secondary | ICD-10-CM | POA: Insufficient documentation

## 2012-10-31 NOTE — Progress Notes (Signed)
P = 100   AFI done in radiology today = 23.55cm.  Pt reports having occasional H/A and blurry vision x3 days - mostly while at work when she becomes very hot. 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 Consult w/Dr. Jolayne Panther who agreed that pt should not work in these conditions. Work excuse Physicist, medical given. Pt instructed to go to [facility] immediately for severe H/A or persistent blurry vision and/or seeing spots. Pt voiced understanding.

## 2012-11-01 ENCOUNTER — Telehealth: Payer: Self-pay | Admitting: *Deleted

## 2012-11-01 NOTE — Progress Notes (Signed)
5/15 NST reviewed and reactive

## 2012-11-01 NOTE — Telephone Encounter (Signed)
Patient left a message stating that she is having really bad tooth pain. She called her dentist but they will only give her tylenol to take and said she should contact us for other options.

## 2012-11-03 ENCOUNTER — Encounter: Payer: Self-pay | Admitting: Advanced Practice Midwife

## 2012-11-04 ENCOUNTER — Ambulatory Visit (INDEPENDENT_AMBULATORY_CARE_PROVIDER_SITE_OTHER): Payer: Medicaid Other | Admitting: Family Medicine

## 2012-11-04 VITALS — BP 130/86 | Temp 97.1°F | Wt >= 6400 oz

## 2012-11-04 DIAGNOSIS — O10019 Pre-existing essential hypertension complicating pregnancy, unspecified trimester: Secondary | ICD-10-CM

## 2012-11-04 DIAGNOSIS — O909 Complication of the puerperium, unspecified: Secondary | ICD-10-CM

## 2012-11-04 DIAGNOSIS — O10013 Pre-existing essential hypertension complicating pregnancy, third trimester: Secondary | ICD-10-CM

## 2012-11-04 DIAGNOSIS — K089 Disorder of teeth and supporting structures, unspecified: Secondary | ICD-10-CM

## 2012-11-04 DIAGNOSIS — K0889 Other specified disorders of teeth and supporting structures: Secondary | ICD-10-CM | POA: Insufficient documentation

## 2012-11-04 LAB — POCT URINALYSIS DIP (DEVICE)
Bilirubin Urine: NEGATIVE
Glucose, UA: NEGATIVE mg/dL
Hgb urine dipstick: NEGATIVE
Ketones, ur: NEGATIVE mg/dL
Nitrite: NEGATIVE
Protein, ur: 30 mg/dL — AB
Specific Gravity, Urine: 1.025 (ref 1.005–1.030)
Urobilinogen, UA: 0.2 mg/dL (ref 0.0–1.0)
pH: 7 (ref 5.0–8.0)

## 2012-11-04 MED ORDER — OXYCODONE-ACETAMINOPHEN 5-325 MG PO TABS: 1.0000 | ORAL_TABLET | Freq: Four times a day (QID) | ORAL | Status: DC | PRN

## 2012-11-04 NOTE — Progress Notes (Signed)
Pt continues to have toothache pain and cannot sleep well. She has seen a dentist and told she needs extraction of wisdom teeth and a molar which is "cracked". This is planned for after delivery of baby.

## 2012-11-04 NOTE — Progress Notes (Signed)
U/S scheduled 11/08/12 at 1045 am.

## 2012-11-04 NOTE — Progress Notes (Signed)
NST reviewed and reactive. BP good--needs u/s for growth add on.

## 2012-11-04 NOTE — Telephone Encounter (Signed)
In clinic today, will address with provider.

## 2012-11-04 NOTE — Addendum Note (Signed)
Addended by: Jill Side on: 11/04/2012 01:07 PM   Modules accepted: Orders

## 2012-11-04 NOTE — Progress Notes (Signed)
Pulse 102 Edema trace in ankles.

## 2012-11-04 NOTE — Patient Instructions (Addendum)
Preeclampsia and Eclampsia Preeclampsia is a condition of high blood pressure during pregnancy. It can happen at 20 weeks or later in pregnancy. If high blood pressure occurs in the second half of pregnancy with no other symptoms, it is called gestational hypertension and goes away after the baby is born. If any of the symptoms listed below develop with gestational hypertension, it is then called preeclampsia. Eclampsia (convulsions) may follow preeclampsia. This is one of the reasons for regular prenatal checkups. Early diagnosis and treatment are very important to prevent eclampsia. CAUSES  There is no known cause of preeclampsia/eclampsia in pregnancy. There are several known conditions that may put the pregnant woman at risk, such as:  The first pregnancy.  Having preeclampsia in a past pregnancy.  Having lasting (chronic) high blood pressure.  Having multiples (twins, triplets).  Being age 24 or older.  African American ethnic background.  Having kidney disease or diabetes.  Medical conditions such as lupus or blood diseases.  Being overweight (obese). SYMPTOMS   High blood pressure.  Headaches.  Sudden weight gain.  Swelling of hands, face, legs, and feet.  Protein in the urine.  Feeling sick to your stomach (nauseous) and throwing up (vomiting).  Vision problems (blurred or double vision).  Numbness in the face, arms, legs, and feet.  Dizziness.  Slurred speech.  Preeclampsia can cause growth retardation in the fetus.  Separation (abruption) of the placenta.  Not enough fluid in the amniotic sac (oligohydramnios).  Sensitivity to bright lights.  Belly (abdominal) pain. DIAGNOSIS  If protein is found in the urine in the second half of pregnancy, this is considered preeclampsia. Other symptoms mentioned above may also be present. TREATMENT  It is necessary to treat this.  Your caregiver may prescribe bed rest early in this condition. Plenty of rest and  salt restriction may be all that is needed.  Medicines may be necessary to lower blood pressure if the condition does not respond to more conservative measures.  In more severe cases, hospitalization may be needed:  For treatment of blood pressure.  To control fluid retention.  To monitor the baby to see if the condition is causing harm to the baby.  Hospitalization is the best way to treat the first sign of preeclampsia. This is so the mother and baby can be watched closely and blood tests can be done effectively and correctly.  If the condition becomes severe, it may be necessary to induce labor or to remove the infant by surgical means (cesarean section). The best cure for preeclampsia/eclampsia is to deliver the baby. Preeclampsia and eclampsia involve risks to mother and infant. Your caregiver will discuss these risks with you. Together, you can work out the best possible approach to your problems. Make sure you keep your prenatal visits as scheduled. Not keeping appointments could result in a chronic or permanent injury, pain, disability to you, and death or injury to you or your unborn baby. If there is any problem keeping the appointment, you must call to reschedule. HOME CARE INSTRUCTIONS   Keep your prenatal appointments and tests as scheduled.  Tell your caregiver if you have any of the above risk factors.  Get plenty of rest and sleep.  Eat a balanced diet that is low in salt, and do not add salt to your food.  Avoid stressful situations.  Only take over-the-counter and prescriptions medicines for pain, discomfort, or fever as directed by your caregiver. SEEK IMMEDIATE MEDICAL CARE IF:   You develop severe swelling  anywhere in the body. This usually occurs in the legs.  You gain 5 lb/2.3 kg or more in a week.  You develop a severe headache, dizziness, problems with your vision, or confusion.  You have abdominal pain, nausea, or vomiting.  You have a seizure.  You  have trouble moving any part of your body, or you develop numbness or problems speaking.  You have bruising or abnormal bleeding from anywhere in the body.  You develop a stiff neck.  You pass out. MAKE SURE YOU:   Understand these instructions.  Will watch your condition.  Will get help right away if you are not doing well or get worse. Document Released: 06/02/2000 Document Revised: 08/28/2011 Document Reviewed: 01/17/2008 [facility] Patient Information 2013 [place], [place].  Breastfeeding Deciding to breastfeed is one of the best choices you can make for you and your baby. The information that follows gives a brief overview of the benefits of breastfeeding as well as common topics surrounding breastfeeding. BENEFITS OF BREASTFEEDING For the baby  The first milk (colostrum) helps the baby's digestive system function better.   There are antibodies in the mother's milk that help the baby fight off infections.   The baby has a lower incidence of asthma, allergies, and sudden infant death syndrome (SIDS).   The nutrients in breast milk are better for the baby than infant formulas, and breast milk helps the baby's brain grow better.   Babies who breastfeed have less gas, colic, and constipation.  For the mother  Breastfeeding helps develop a very special bond between the mother and her baby.   Breastfeeding is convenient, always available at the correct temperature, and costs nothing.   Breastfeeding burns calories in the mother and helps her lose weight that was gained during pregnancy.   Breastfeeding makes the uterus contract back down to normal size faster and slows bleeding following delivery.   Breastfeeding mothers have a lower risk of developing breast cancer.  BREASTFEEDING FREQUENCY  A healthy, full-term baby may breastfeed as often as every hour or space his or her feedings to every 3 hours.   Watch your baby for signs of hunger. Nurse your baby if he or  she shows signs of hunger. How often you nurse will vary from baby to baby.   Nurse as often as the baby requests, or when you feel the need to reduce the fullness of your breasts.   Awaken the baby if it has been 3 4 hours since the last feeding.   Frequent feeding will help the mother make more milk and will help prevent problems, such as sore nipples and engorgement of the breasts.  BABY'S POSITION AT THE BREAST  Whether lying down or sitting, be sure that the baby's tummy is facing your tummy.   Support the breast with 4 fingers underneath the breast and the thumb above. Make sure your fingers are well away from the nipple and baby's mouth.   Stroke the baby's lips gently with your finger or nipple.   When the baby's mouth is open wide enough, place all of your nipple and as much of the areola as possible into your baby's mouth.   Pull the baby in close so the tip of the nose and the baby's cheeks touch the breast during the feeding.  FEEDINGS AND SUCTION  The length of each feeding varies from baby to baby and from feeding to feeding.   The baby must suck about 2 3 minutes for your milk to  get to him or her. This is called a "let down." For this reason, allow the baby to feed on each breast as long as he or she wants. Your baby will end the feeding when he or she has received the right balance of nutrients.   To break the suction, put your finger into the corner of the baby's mouth and slide it between his or her gums before removing your breast from his or her mouth. This will help prevent sore nipples.  HOW TO TELL WHETHER YOUR BABY IS GETTING ENOUGH BREAST MILK. Wondering whether or not your baby is getting enough milk is a common concern among mothers. You can be assured that your baby is getting enough milk if:   Your baby is actively sucking and you hear swallowing.   Your baby seems relaxed and satisfied after a feeding.   Your baby nurses at least 8 12 times  in a 24 hour time period. Nurse your baby until he or she unlatches or falls asleep at the first breast (at least 10 20 minutes), then offer the second side.   Your baby is wetting 5 6 disposable diapers (6 8 cloth diapers) in a 24 hour period by 23 52 days of age.   Your baby is having at least 3 4 stools every 24 hours for the first 6 weeks. The stool should be soft and yellow.   Your baby should gain 4 7 ounces per week after he or she is 58 days old.   Your breasts feel softer after nursing.  REDUCING BREAST ENGORGEMENT  In the first week after your baby is born, you may experience signs of breast engorgement. When breasts are engorged, they feel heavy, warm, full, and may be tender to the touch. You can reduce engorgement if you:   Nurse frequently, every 2 3 hours. Mothers who breastfeed early and often have fewer problems with engorgement.   Place light ice packs on your breasts for 10 20 minutes between feedings. This reduces swelling. Wrap the ice packs in a lightweight towel to protect your skin. Bags of frozen vegetables work well for this purpose.   Take a warm shower or apply warm, moist heat to your breast for 5 10 minutes just before each feeding. This increases circulation and helps the milk flow.   Gently massage your breast before and during the feeding. Using your finger tips, massage from the chest wall towards your nipple in a circular motion.   Make sure that the baby empties at least one breast at every feeding before switching sides.   Use a breast pump to empty the breasts if your baby is sleepy or not nursing well. You may also want to pump if you are returning to work oryou feel you are getting engorged.   Avoid bottle feeds, pacifiers, or supplemental feedings of water or juice in place of breastfeeding. Breast milk is all the food your baby needs. It is not necessary for your baby to have water or formula. In fact, to help your breasts make more milk,  it is best not to give your baby supplemental feedings during the early weeks.   Be sure the baby is latched on and positioned properly while breastfeeding.   Wear a supportive bra, avoiding underwire styles.   Eat a balanced diet with enough fluids.   Rest often, relax, and take your prenatal vitamins to prevent fatigue, stress, and anemia.  If you follow these suggestions, your engorgement should improve  in 24 48 hours. If you are still experiencing difficulty, call your lactation consultant or caregiver.  CARING FOR YOURSELF Take care of your breasts  Bathe or shower daily.   Avoid using soap on your nipples.   Start feedings on your left breast at one feeding and on your right breast at the next feeding.   You will notice an increase in your milk supply 2 5 days after delivery. You may feel some discomfort from engorgement, which makes your breasts very firm and often tender. Engorgement "peaks" out within 24 48 hours. In the meantime, apply warm moist towels to your breasts for 5 10 minutes before feeding. Gentle massage and expression of some milk before feeding will soften your breasts, making it easier for your baby to latch on.   Wear a well-fitting nursing bra, and air dry your nipples for a 3 after each feeding.   Only use cotton bra pads.   Only use pure lanolin on your nipples after nursing. You do not need to wash it off before feeding the baby again. Another option is to express a few drops of breast milk and gently massage it into your nipples.  Take care of yourself  Eat well-balanced meals and nutritious snacks.   Drinking milk, fruit juice, and water to satisfy your thirst (about 8 glasses a day).   Get plenty of rest.  Avoid foods that you notice affect the baby in a bad way.  SEEK MEDICAL CARE IF:   You have difficulty with breastfeeding and need help.   You have a hard, red, sore area on your breast that is accompanied by a fever.    Your baby is too sleepy to eat well or is having trouble sleeping.   Your baby is wetting less than 6 diapers a day, by 23 days of age.   Your baby's skin or white part of his or her eyes is more yellow than it was in the hospital.   You feel depressed.  Document Released: 06/05/2005 Document Revised: 12/05/2011 Document Reviewed: 09/03/2011 [facility] Patient Information 2013 [place], [place].

## 2012-11-07 ENCOUNTER — Encounter: Payer: Self-pay | Admitting: *Deleted

## 2012-11-08 ENCOUNTER — Ambulatory Visit (HOSPITAL_COMMUNITY)
Admission: RE | Admit: 2012-11-08 | Discharge: 2012-11-08 | Disposition: A | Discharge status: Home / Self Care | Payer: Medicaid Other | Source: Ambulatory Visit | Attending: Family Medicine | Admitting: Family Medicine

## 2012-11-08 ENCOUNTER — Ambulatory Visit (HOSPITAL_COMMUNITY): Payer: Medicaid Other

## 2012-11-08 ENCOUNTER — Ambulatory Visit (INDEPENDENT_AMBULATORY_CARE_PROVIDER_SITE_OTHER): Payer: Medicaid Other | Admitting: *Deleted

## 2012-11-08 VITALS — BP 125/70

## 2012-11-08 DIAGNOSIS — O10013 Pre-existing essential hypertension complicating pregnancy, third trimester: Secondary | ICD-10-CM

## 2012-11-08 DIAGNOSIS — O139 Gestational [pregnancy-induced] hypertension without significant proteinuria, unspecified trimester: Secondary | ICD-10-CM | POA: Insufficient documentation

## 2012-11-08 DIAGNOSIS — O4400 Placenta previa specified as without hemorrhage, unspecified trimester: Secondary | ICD-10-CM | POA: Insufficient documentation

## 2012-11-08 DIAGNOSIS — E669 Obesity, unspecified: Secondary | ICD-10-CM | POA: Insufficient documentation

## 2012-11-08 DIAGNOSIS — O10019 Pre-existing essential hypertension complicating pregnancy, unspecified trimester: Secondary | ICD-10-CM

## 2012-11-08 NOTE — Progress Notes (Signed)
P=88 

## 2012-11-10 NOTE — Progress Notes (Signed)
5/23 NST reviewed and reactive 

## 2012-11-12 ENCOUNTER — Other Ambulatory Visit: Payer: Medicaid Other

## 2012-11-14 ENCOUNTER — Ambulatory Visit (HOSPITAL_COMMUNITY)
Admission: RE | Admit: 2012-11-14 | Discharge: 2012-11-14 | Disposition: A | Discharge status: Home / Self Care | Payer: Medicaid Other | Source: Ambulatory Visit | Attending: Obstetrics and Gynecology | Admitting: Obstetrics and Gynecology

## 2012-11-14 ENCOUNTER — Ambulatory Visit (INDEPENDENT_AMBULATORY_CARE_PROVIDER_SITE_OTHER): Payer: Medicaid Other | Admitting: *Deleted

## 2012-11-14 DIAGNOSIS — O139 Gestational [pregnancy-induced] hypertension without significant proteinuria, unspecified trimester: Secondary | ICD-10-CM | POA: Insufficient documentation

## 2012-11-14 DIAGNOSIS — O09299 Supervision of pregnancy with other poor reproductive or obstetric history, unspecified trimester: Secondary | ICD-10-CM | POA: Insufficient documentation

## 2012-11-14 DIAGNOSIS — O99810 Abnormal glucose complicating pregnancy: Secondary | ICD-10-CM

## 2012-11-14 DIAGNOSIS — E669 Obesity, unspecified: Secondary | ICD-10-CM | POA: Insufficient documentation

## 2012-11-14 DIAGNOSIS — O10013 Pre-existing essential hypertension complicating pregnancy, third trimester: Secondary | ICD-10-CM

## 2012-11-18 ENCOUNTER — Ambulatory Visit (INDEPENDENT_AMBULATORY_CARE_PROVIDER_SITE_OTHER): Payer: Medicaid Other | Admitting: Obstetrics and Gynecology

## 2012-11-18 ENCOUNTER — Encounter: Payer: Self-pay | Admitting: Obstetrics and Gynecology

## 2012-11-18 ENCOUNTER — Ambulatory Visit: Payer: Medicaid Other | Admitting: Family Medicine

## 2012-11-18 VITALS — BP 135/87 | Temp 97.7°F | Wt >= 6400 oz

## 2012-11-18 DIAGNOSIS — O3663X1 Maternal care for excessive fetal growth, third trimester, fetus 1: Secondary | ICD-10-CM

## 2012-11-18 DIAGNOSIS — O99013 Anemia complicating pregnancy, third trimester: Secondary | ICD-10-CM

## 2012-11-18 DIAGNOSIS — O909 Complication of the puerperium, unspecified: Secondary | ICD-10-CM

## 2012-11-18 DIAGNOSIS — O34219 Maternal care for unspecified type scar from previous cesarean delivery: Secondary | ICD-10-CM

## 2012-11-18 DIAGNOSIS — O10019 Pre-existing essential hypertension complicating pregnancy, unspecified trimester: Secondary | ICD-10-CM

## 2012-11-18 DIAGNOSIS — O99019 Anemia complicating pregnancy, unspecified trimester: Secondary | ICD-10-CM

## 2012-11-18 DIAGNOSIS — O3660X0 Maternal care for excessive fetal growth, unspecified trimester, not applicable or unspecified: Secondary | ICD-10-CM

## 2012-11-18 DIAGNOSIS — O10013 Pre-existing essential hypertension complicating pregnancy, third trimester: Secondary | ICD-10-CM

## 2012-11-18 LAB — POCT URINALYSIS DIP (DEVICE)
Bilirubin Urine: NEGATIVE
Glucose, UA: NEGATIVE mg/dL
Nitrite: NEGATIVE

## 2012-11-18 LAB — OB RESULTS CONSOLE GC/CHLAMYDIA
Chlamydia: NEGATIVE
Gonorrhea: NEGATIVE

## 2012-11-18 NOTE — Addendum Note (Signed)
Addended by: Franchot Mimes on: 11/18/2012 12:02 PM   Modules accepted: Orders

## 2012-11-18 NOTE — Progress Notes (Signed)
Pulse: 99 

## 2012-11-18 NOTE — Progress Notes (Signed)
NST reviewed and reactive. FM/PTL precautions reviewed. Cultures collected today.

## 2012-11-18 NOTE — Progress Notes (Signed)
Pt has weekly AFI's @ radiology- next on 6/6.

## 2012-11-19 LAB — GC/CHLAMYDIA PROBE AMP: CT Probe RNA: NEGATIVE

## 2012-11-21 ENCOUNTER — Encounter: Payer: Self-pay | Admitting: Obstetrics and Gynecology

## 2012-11-22 ENCOUNTER — Ambulatory Visit (HOSPITAL_COMMUNITY)
Admission: RE | Admit: 2012-11-22 | Discharge: 2012-11-22 | Disposition: A | Discharge status: Home / Self Care | Payer: Medicaid Other | Source: Ambulatory Visit | Attending: Family Medicine | Admitting: Family Medicine

## 2012-11-22 ENCOUNTER — Encounter: Payer: Self-pay | Admitting: *Deleted

## 2012-11-22 ENCOUNTER — Ambulatory Visit (INDEPENDENT_AMBULATORY_CARE_PROVIDER_SITE_OTHER): Payer: Medicaid Other | Admitting: *Deleted

## 2012-11-22 VITALS — BP 137/63

## 2012-11-22 DIAGNOSIS — O99210 Obesity complicating pregnancy, unspecified trimester: Secondary | ICD-10-CM | POA: Insufficient documentation

## 2012-11-22 DIAGNOSIS — O99810 Abnormal glucose complicating pregnancy: Secondary | ICD-10-CM

## 2012-11-22 DIAGNOSIS — O09299 Supervision of pregnancy with other poor reproductive or obstetric history, unspecified trimester: Secondary | ICD-10-CM | POA: Insufficient documentation

## 2012-11-22 DIAGNOSIS — O10019 Pre-existing essential hypertension complicating pregnancy, unspecified trimester: Secondary | ICD-10-CM

## 2012-11-22 DIAGNOSIS — O10013 Pre-existing essential hypertension complicating pregnancy, third trimester: Secondary | ICD-10-CM

## 2012-11-22 DIAGNOSIS — O139 Gestational [pregnancy-induced] hypertension without significant proteinuria, unspecified trimester: Secondary | ICD-10-CM | POA: Insufficient documentation

## 2012-11-22 DIAGNOSIS — E669 Obesity, unspecified: Secondary | ICD-10-CM | POA: Insufficient documentation

## 2012-11-22 LAB — CULTURE, BETA STREP (GROUP B ONLY)

## 2012-11-22 NOTE — Progress Notes (Addendum)
NST reviewed and reactive.  

## 2012-11-22 NOTE — Progress Notes (Signed)
P = 100   AFI = 16.18 in radiology today- repeat in 1 week

## 2012-11-25 ENCOUNTER — Encounter: Payer: Medicaid Other | Admitting: Obstetrics and Gynecology

## 2012-11-25 ENCOUNTER — Ambulatory Visit (INDEPENDENT_AMBULATORY_CARE_PROVIDER_SITE_OTHER): Payer: Medicaid Other | Admitting: Obstetrics & Gynecology

## 2012-11-25 VITALS — BP 133/82 | Temp 96.9°F | Wt >= 6400 oz

## 2012-11-25 DIAGNOSIS — O0990 Supervision of high risk pregnancy, unspecified, unspecified trimester: Secondary | ICD-10-CM

## 2012-11-25 DIAGNOSIS — O99810 Abnormal glucose complicating pregnancy: Secondary | ICD-10-CM

## 2012-11-25 DIAGNOSIS — O34219 Maternal care for unspecified type scar from previous cesarean delivery: Secondary | ICD-10-CM

## 2012-11-25 DIAGNOSIS — O0993 Supervision of high risk pregnancy, unspecified, third trimester: Secondary | ICD-10-CM

## 2012-11-25 LAB — POCT URINALYSIS DIP (DEVICE)
Glucose, UA: NEGATIVE mg/dL
Protein, ur: 30 mg/dL — AB
Specific Gravity, Urine: 1.025 (ref 1.005–1.030)
Urobilinogen, UA: 0.2 mg/dL (ref 0.0–1.0)

## 2012-11-25 NOTE — Patient Instructions (Signed)
Cesarean Delivery  Cesarean delivery is the birth of a baby through a cut (incision) in the abdomen and womb (uterus).  LET YOUR CAREGIVER KNOW ABOUT:  Complicationsinvolving the pregnancy.  Allergies.  Medicines taken including herbs, eyedrops, over-the-counter medicines, and creams.  Use of steroids (by mouth or creams).  Previous problems with anesthetics or numbing medicine.  Previous surgery.  History of blood clots.  History of bleeding or blood problems.  Other health problems. RISKS AND COMPLICATIONS   Bleeding.  Infection.  Blood clots.  Injury to surrounding organs.  Anesthesia problems.  Injury to the baby. BEFORE THE PROCEDURE   A tube (Foley catheter) will be placed in your bladder. The Foley catheter drains the urine from your bladder into a bag. This keeps your bladder empty during surgery.  An intravenous access tube (IV) will be placed in your arm.  Hair may be removed from your pubic area and your lower abdomen. This is to prevent infection in the incision site.  You may be given an antacid medicine to drink. This will prevent acid contents in your stomach from going into your lungs if you vomit during the surgery.  You may be given an antibiotic medicine to prevent infection. PROCEDURE   You may be given medicine to numb the lower half of your body (regional anesthetic). If you were in labor, you may have already had an epidural in place which can be used in both labor and cesarean delivery. You may possibly be given medicine to make you sleep (general anesthetic) though this is not as common.  An incision will be made in your abdomen that extends to your uterus. There are 2 basic kinds of incisions:  The horizontal (transverse) incision. Horizontal incisions are used for most routine cesarean deliveries.  The vertical (up and down) incision. This is less commonly used. This is most often reserved for women who have a serious complication  (extreme prematurity) or under emergency situations.  The horizontal and vertical incisions may both be used at the same time. However, this is very uncommon.  Your baby will then be delivered. AFTER THE PROCEDURE   If you were awake during the surgery, you will see your baby right away. If you were asleep, you will see your baby as soon as you are awake.  You may breastfeed your baby after surgery.  You may be able to get up and walk the same day as the surgery. If you need to stay in bed for a period of time, you will receive help to turn, cough, and take deep breaths after surgery. This helps prevent lung problems such as pneumonia.  Do not get out of bed alone the first time after surgery. You will need help getting out of bed until you are able to do this by yourself.  You may be able to shower the day after your cesarean delivery. After the bandage (dressing) is taken off the incision site, a nurse will assist you to shower, if you like.  You will have pneumatic compressing hose placed on your feet or lower legs. These hose are used to prevent blood clots. When you are up and walking regularly, they will no longer be necessary.  Do not cross your legs when you sit.  Save any blood clots that you pass. If you pass a clot while on the toilet, do not flush it. Call for the nurse. Tell the nurse if you think you are bleeding too much or passing too many   clots.  Start drinking liquids and eating food as directed by your caregiver. If your stomach is not ready, drinking and eating too soon can cause an increase in bloating and swelling of your intestine and abdomen. This is very uncomfortable.  You will be given medicine as needed. Let your caregivers know if you are hurting. They want you to be comfortable. You may also be given an antibiotic to prevent an infection.  Your IV will be taken out when you are drinking a reasonable amount of fluids. The Foley catheter is taken out when  you are up and walking.  If your blood type is Rh negative and your baby's blood type is Rh positive, you will be given a shot of anti-D immune globulin. This shot prevents you from having Rh problems with a future pregnancy. You should get the shot even if you had your tubes tied (tubal ligation).  If you are allowed to take the baby for a walk, place the baby in the bassinet and push it. Do not carry your baby in your arms. Document Released: 06/05/2005 Document Revised: 08/28/2011 Document Reviewed: 09/30/2010 [employer] Patient Information 2014 [employer].  

## 2012-11-25 NOTE — Progress Notes (Signed)
NST reactive today.

## 2012-11-25 NOTE — Progress Notes (Signed)
Pt reports URI sx.  Weekly AFI in radiology - next on 6/13.  Rpt C/S scheduled on 12/11/12.

## 2012-11-25 NOTE — Progress Notes (Signed)
P = 98 

## 2012-11-29 ENCOUNTER — Ambulatory Visit (INDEPENDENT_AMBULATORY_CARE_PROVIDER_SITE_OTHER): Payer: Medicaid Other | Admitting: *Deleted

## 2012-11-29 ENCOUNTER — Ambulatory Visit (HOSPITAL_COMMUNITY)
Admission: RE | Admit: 2012-11-29 | Discharge: 2012-11-29 | Disposition: A | Discharge status: Home / Self Care | Payer: Medicaid Other | Source: Ambulatory Visit | Attending: Family Medicine | Admitting: Family Medicine

## 2012-11-29 VITALS — BP 136/70

## 2012-11-29 DIAGNOSIS — E669 Obesity, unspecified: Secondary | ICD-10-CM | POA: Insufficient documentation

## 2012-11-29 DIAGNOSIS — O10019 Pre-existing essential hypertension complicating pregnancy, unspecified trimester: Secondary | ICD-10-CM

## 2012-11-29 DIAGNOSIS — O139 Gestational [pregnancy-induced] hypertension without significant proteinuria, unspecified trimester: Secondary | ICD-10-CM | POA: Insufficient documentation

## 2012-11-29 DIAGNOSIS — O10013 Pre-existing essential hypertension complicating pregnancy, third trimester: Secondary | ICD-10-CM

## 2012-11-29 DIAGNOSIS — O99210 Obesity complicating pregnancy, unspecified trimester: Secondary | ICD-10-CM | POA: Insufficient documentation

## 2012-11-29 DIAGNOSIS — O09299 Supervision of pregnancy with other poor reproductive or obstetric history, unspecified trimester: Secondary | ICD-10-CM | POA: Insufficient documentation

## 2012-11-29 NOTE — Progress Notes (Signed)
P = 98   AFI in radiology today = 17.7

## 2012-12-01 NOTE — Progress Notes (Signed)
NST performed today was reviewed and was found to be reactive.  Continue recommended antenatal testing and prenatal care.  

## 2012-12-02 ENCOUNTER — Ambulatory Visit (INDEPENDENT_AMBULATORY_CARE_PROVIDER_SITE_OTHER): Payer: Medicaid Other | Admitting: Family Medicine

## 2012-12-02 ENCOUNTER — Encounter: Payer: Self-pay | Admitting: Family Medicine

## 2012-12-02 VITALS — BP 141/89 | Temp 97.4°F | Wt >= 6400 oz

## 2012-12-02 DIAGNOSIS — O99810 Abnormal glucose complicating pregnancy: Secondary | ICD-10-CM

## 2012-12-02 DIAGNOSIS — O10019 Pre-existing essential hypertension complicating pregnancy, unspecified trimester: Secondary | ICD-10-CM

## 2012-12-02 DIAGNOSIS — O10013 Pre-existing essential hypertension complicating pregnancy, third trimester: Secondary | ICD-10-CM

## 2012-12-02 LAB — POCT URINALYSIS DIP (DEVICE)
Bilirubin Urine: NEGATIVE
Glucose, UA: NEGATIVE mg/dL
Nitrite: NEGATIVE
Urobilinogen, UA: 0.2 mg/dL (ref 0.0–1.0)

## 2012-12-02 NOTE — Patient Instructions (Signed)
Breastfeeding A change in hormones during your pregnancy causes growth of your breast tissue and an increase in number and size of milk ducts. The hormone prolactin allows proteins, sugars, and fats from your blood supply to make breast milk in your milk-producing glands. The hormone progesterone prevents breast milk from being released before the birth of your baby. After the birth of your baby, your progesterone level decreases allowing breast milk to be released. Thoughts of your baby, as well as his or her sucking or crying, can stimulate the release of milk from the milk-producing glands. Deciding to breastfeed (nurse) is one of the best choices you can make for you and your baby. The information that follows gives a brief review of the benefits, as well as other important skills to know about breastfeeding. BENEFITS OF BREASTFEEDING For your baby  The first milk (colostrum) helps your baby's digestive system function better.   There are antibodies in your milk that help your baby fight off infections.   Your baby has a lower incidence of asthma, allergies, and sudden infant death syndrome (SIDS).   The nutrients in breast milk are better for your baby than infant formulas.  Breast milk improves your baby's brain development.   Your baby will have less gas, colic, and constipation.  Your baby is less likely to develop other conditions, such as childhood obesity, asthma, or diabetes mellitus. For you  Breastfeeding helps develop a very special bond between you and your baby.   Breastfeeding is convenient, always available at the correct temperature, and costs nothing.   Breastfeeding helps to burn calories and helps you lose the weight gained during pregnancy.   Breastfeeding makes your uterus contract back down to normal size faster and slows bleeding following delivery.   Breastfeeding mothers have a lower risk of developing osteoporosis or breast or ovarian cancer later  in life.  BREASTFEEDING FREQUENCY  A healthy, full-term baby may breastfeed as often as every hour or space his or her feedings to every 3 hours. Breastfeeding frequency will vary from baby to baby.   Newborns should be fed no less than every 2 3 hours during the day and every 4 5 hours during the night. You should breastfeed a minimum of 8 feedings in a 24 hour period.  Awaken your baby to breastfeed if it has been 3 4 hours since the last feeding.  Breastfeed when you feel the need to reduce the fullness of your breasts or when your newborn shows signs of hunger. Signs that your baby may be hungry include:  Increased alertness or activity.  Stretching.  Movement of the head from side to side.  Movement of the head and opening of the mouth when the corner of the mouth or cheek is stroked (rooting).  Increased sucking sounds, smacking lips, cooing, sighing, or squeaking.  Hand-to-mouth movements.  Increased sucking of fingers or hands.  Fussing.  Intermittent crying.  Signs of extreme hunger will require calming and consoling before you try to feed your baby. Signs of extreme hunger may include:  Restlessness.  A loud, strong cry.  Screaming.  Frequent feeding will help you make more milk and will help prevent problems, such as sore nipples and engorgement of the breasts.  BREASTFEEDING   Whether lying down or sitting, be sure that the baby's abdomen is facing your abdomen.   Support your breast with 4 fingers under your breast and your thumb above your nipple. Make sure your fingers are well away from   your nipple and your baby's mouth.   Stroke your baby's lips gently with your finger or nipple.   When your baby's mouth is open wide enough, place all of your nipple and as much of the colored area around your nipple (areola) as possible into your baby's mouth.  More areola should be visible above his or her upper lip than below his or her lower lip.  Your  baby's tongue should be between his or her lower gum and your breast.  Ensure that your baby's mouth is correctly positioned around the nipple (latched). Your baby's lips should create a seal on your breast.  Signs that your baby has effectively latched onto your nipple include:  Tugging or sucking without pain.  Swallowing heard between sucks.  Absent click or smacking sound.  Muscle movement above and in front of his or her ears with sucking.  Your baby must suck about 2 3 minutes in order to get your milk. Allow your baby to feed on each breast as long as he or she wants. Nurse your baby until he or she unlatches or falls asleep at the first breast, then offer the second breast.  Signs that your baby is full and satisfied include:  A gradual decrease in the number of sucks or complete cessation of sucking.  Falling asleep.  Extension or relaxation of his or her body.  Retention of a small amount of milk in his or her mouth.  Letting go of your breast by himself or herself.  Signs of effective breastfeeding in you include:  Breasts that have increased firmness, weight, and size prior to feeding.  Breasts that are softer after nursing.  Increased milk volume, as well as a change in milk consistency and color by the 5th day of breastfeeding.  Breast fullness relieved by breastfeeding.  Nipples are not sore, cracked, or bleeding.  If needed, break the suction by putting your finger into the corner of your baby's mouth and sliding your finger between his or her gums. Then, remove your breast from his or her mouth.  It is common for babies to spit up a small amount after a feeding.  Babies often swallow air during feeding. This can make babies fussy. Burping your baby between breasts can help with this.  Vitamin D supplements are recommended for babies who get only breast milk.  Avoid using a pacifier during your baby's first 4 6 weeks.  Avoid supplemental feedings of  water, formula, or juice in place of breastfeeding. Breast milk is all the food your baby needs. It is not necessary for your baby to have water or formula. Your breasts will make more milk if supplemental feedings are avoided during the early weeks. HOW TO TELL WHETHER YOUR BABY IS GETTING ENOUGH BREAST MILK Wondering whether or not your baby is getting enough milk is a common concern among mothers. You can be assured that your baby is getting enough milk if:   Your baby is actively sucking and you hear swallowing.   Your baby seems relaxed and satisfied after a feeding.   Your baby nurses at least 8 12 times in a 24 hour time period.  During the first 3 5 days of age:  Your baby is wetting at least 3 5 diapers in a 24 hour period. The urine should be clear and pale yellow.  Your baby is having at least 3 4 stools in a 24 hour period. The stool should be soft and yellow.  At   5 7 days of age, your baby is having at least 3 6 stools in a 24 hour period. The stool should be seedy and yellow by 5 days of age.  Your baby has a weight loss less than 7 10% during the first 3 days of age.  Your baby does not lose weight after 3 7 days of age.  Your baby gains 4 7 ounces each week after he or she is 4 days of age.  Your baby gains weight by 5 days of age and is back to birth weight within 2 weeks. ENGORGEMENT In the first week after your baby is born, you may experience extremely full breasts (engorgement). When engorged, your breasts may feel heavy, warm, or tender to the touch. Engorgement peaks within 24 48 hours after delivery of your baby.  Engorgement may be reduced by:  Continuing to breastfeed.  Increasing the frequency of breastfeeding.  Taking warm showers or applying warm, moist heat to your breasts just before each feeding. This increases circulation and helps the milk flow.   Gently massaging your breast before and during the feedings. With your fingertips, massage from  your chest wall towards your nipple in a circular motion.   Ensuring that your baby empties at least one breast at every feeding. It also helps to start the next feeding on the opposite breast.   Expressing breast milk by hand or by using a breast pump to empty the breasts if your baby is sleepy, or not nursing well. You may also want to express milk if you are returning to work oryou feel you are getting engorged.  Ensuring your baby is latched on and positioned properly while breastfeeding. If you follow these suggestions, your engorgement should improve in 24 48 hours. If you are still experiencing difficulty, call your lactation consultant or caregiver.  CARING FOR YOURSELF Take care of your breasts.  Bathe or shower daily.   Avoid using soap on your nipples.   Wear a supportive bra. Avoid wearing underwire style bras.  Air dry your nipples for a 3 4minutes after each feeding.   Use only cotton bra pads to absorb breast milk leakage. Leaking of breast milk between feedings is normal.   Use only pure lanolin on your nipples after nursing. You do not need to wash it off before feeding your baby again. Another option is to express a few drops of breast milk and gently massage that milk into your nipples.  Continue breast self-awareness checks. Take care of yourself.  Eat healthy foods. Alternate 3 meals with 3 snacks.  Avoid foods that you notice affect your baby in a bad way.  Drink milk, fruit juice, and water to satisfy your thirst (about 8 glasses a day).   Rest often, relax, and take your prenatal vitamins to prevent fatigue, stress, and anemia.  Avoid chewing and smoking tobacco.  Avoid alcohol and drug use.  Take over-the-counter and prescribed medicine only as directed by your caregiver or pharmacist. You should always check with your caregiver or pharmacist before taking any new medicine, vitamin, or herbal supplement.  Know that pregnancy is possible while  breastfeeding. If desired, talk to your caregiver about family planning and safe birth control methods that may be used while breastfeeding. SEEK MEDICAL CARE IF:   You feel like you want to stop breastfeeding or have become frustrated with breastfeeding.  You have painful breasts or nipples.  Your nipples are cracked or bleeding.  Your breasts are red, tender,   or warm.  You have a swollen area on either breast.  You have a fever or chills.  You have nausea or vomiting.  You have drainage from your nipples.  Your breasts do not become full before feedings by the 5th day after delivery.  You feel sad and depressed.  Your baby is too sleepy to eat well.  Your baby is having trouble sleeping.   Your baby is wetting less than 3 diapers in a 24 hour period.  Your baby has less than 3 stools in a 24 hour period.  Your baby's skin or the white part of his or her eyes becomes more yellow.   Your baby is not gaining weight by 5 days of age. MAKE SURE YOU:   Understand these instructions.  Will watch your condition.  Will get help right away if you are not doing well or get worse. Document Released: 06/05/2005 Document Revised: 02/28/2012 Document Reviewed: 01/10/2012 [employer] Patient Information 2014 [employer].  

## 2012-12-02 NOTE — Progress Notes (Signed)
Pulse- 85 Patient reports pain/pressure in vaginal area especially with walking and occasional contractions

## 2012-12-02 NOTE — Progress Notes (Signed)
NST reviewed and reactive. BP ok C-section scheduled.

## 2012-12-04 ENCOUNTER — Encounter (HOSPITAL_COMMUNITY): Payer: Self-pay | Admitting: Pharmacist

## 2012-12-05 ENCOUNTER — Encounter (HOSPITAL_COMMUNITY): Payer: Self-pay

## 2012-12-06 ENCOUNTER — Ambulatory Visit (INDEPENDENT_AMBULATORY_CARE_PROVIDER_SITE_OTHER): Payer: Medicaid Other | Admitting: *Deleted

## 2012-12-06 ENCOUNTER — Ambulatory Visit (HOSPITAL_COMMUNITY)
Admission: RE | Admit: 2012-12-06 | Discharge: 2012-12-06 | Disposition: A | Discharge status: Home / Self Care | Payer: Medicaid Other | Source: Ambulatory Visit | Attending: Obstetrics & Gynecology | Admitting: Obstetrics & Gynecology

## 2012-12-06 VITALS — BP 133/72 | Temp 98.8°F | Wt >= 6400 oz

## 2012-12-06 DIAGNOSIS — O139 Gestational [pregnancy-induced] hypertension without significant proteinuria, unspecified trimester: Secondary | ICD-10-CM | POA: Insufficient documentation

## 2012-12-06 DIAGNOSIS — O10019 Pre-existing essential hypertension complicating pregnancy, unspecified trimester: Secondary | ICD-10-CM

## 2012-12-06 DIAGNOSIS — O10013 Pre-existing essential hypertension complicating pregnancy, third trimester: Secondary | ICD-10-CM

## 2012-12-06 DIAGNOSIS — O99810 Abnormal glucose complicating pregnancy: Secondary | ICD-10-CM

## 2012-12-06 DIAGNOSIS — O09299 Supervision of pregnancy with other poor reproductive or obstetric history, unspecified trimester: Secondary | ICD-10-CM | POA: Insufficient documentation

## 2012-12-06 DIAGNOSIS — E669 Obesity, unspecified: Secondary | ICD-10-CM | POA: Insufficient documentation

## 2012-12-06 DIAGNOSIS — O99210 Obesity complicating pregnancy, unspecified trimester: Secondary | ICD-10-CM | POA: Insufficient documentation

## 2012-12-06 NOTE — Progress Notes (Signed)
P=85, here for NST

## 2012-12-09 ENCOUNTER — Encounter (HOSPITAL_COMMUNITY): Payer: Self-pay

## 2012-12-09 ENCOUNTER — Encounter (HOSPITAL_COMMUNITY)
Admission: RE | Admit: 2012-12-09 | Discharge: 2012-12-09 | Disposition: A | Discharge status: Home / Self Care | Payer: Medicaid Other | Source: Ambulatory Visit | Attending: Obstetrics & Gynecology | Admitting: Obstetrics & Gynecology

## 2012-12-09 ENCOUNTER — Encounter: Payer: Self-pay | Admitting: Obstetrics & Gynecology

## 2012-12-09 ENCOUNTER — Ambulatory Visit (INDEPENDENT_AMBULATORY_CARE_PROVIDER_SITE_OTHER): Payer: Medicaid Other | Admitting: Obstetrics & Gynecology

## 2012-12-09 VITALS — HR 93 | Temp 97.2°F | Resp 20 | Ht 64.0 in | Wt >= 6400 oz

## 2012-12-09 VITALS — BP 140/94 | Temp 97.8°F | Wt >= 6400 oz

## 2012-12-09 DIAGNOSIS — O99810 Abnormal glucose complicating pregnancy: Secondary | ICD-10-CM

## 2012-12-09 DIAGNOSIS — O34219 Maternal care for unspecified type scar from previous cesarean delivery: Secondary | ICD-10-CM

## 2012-12-09 DIAGNOSIS — O10013 Pre-existing essential hypertension complicating pregnancy, third trimester: Secondary | ICD-10-CM

## 2012-12-09 DIAGNOSIS — O10019 Pre-existing essential hypertension complicating pregnancy, unspecified trimester: Secondary | ICD-10-CM

## 2012-12-09 DIAGNOSIS — O99013 Anemia complicating pregnancy, third trimester: Secondary | ICD-10-CM

## 2012-12-09 HISTORY — DX: Nausea with vomiting, unspecified: R11.2

## 2012-12-09 HISTORY — DX: Other specified postprocedural states: Z98.890

## 2012-12-09 LAB — CBC
Hemoglobin: 9.1 g/dL — ABNORMAL LOW (ref 12.0–15.0)
MCHC: 31 g/dL (ref 30.0–36.0)
RBC: 3.93 MIL/uL (ref 3.87–5.11)
WBC: 6.5 10*3/uL (ref 4.0–10.5)

## 2012-12-09 LAB — RPR: RPR Ser Ql: NONREACTIVE

## 2012-12-09 NOTE — Patient Instructions (Addendum)
   Your procedure is scheduled on: Wednesday, 12/11/12 Enter through the Main Entrance of [facility] at: 9:30 am  Pick up the phone at the desk and dial [phone] and inform us of your arrival.  Please call this number if you have any problems the morning of surgery: [phone]  Remember: Do not eat any solid foods or drink any liquids after midnight on: Tuesday, 12/10/12  Please take these medications morning of surgery: Labatelol, with a sip of water  Do not wear jewelry, make-up, or FINGER nail polish No metal in your hair or on your body. Do not wear lotions, powders, perfumes. You may wear deodorant.  Please use your CHG wash as directed prior to surgery.  Do not shave anywhere for at least 12 hours prior to first CHG shower.  Do not bring valuables to the hospital. Contacts, Dentures and Partial Plates may not be worn to OR  Leave suitcase in the car. After Surgery it may be brought to your room.  For patients being admitted to the hospital, checkout time is 11:00am the day of discharge.   For patients being discharged-you must have a ride home.

## 2012-12-09 NOTE — Patient Instructions (Signed)
Cesarean Delivery  Cesarean delivery is the birth of a baby through a cut (incision) in the abdomen and womb (uterus).  LET YOUR CAREGIVER KNOW ABOUT:  Complicationsinvolving the pregnancy.  Allergies.  Medicines taken including herbs, eyedrops, over-the-counter medicines, and creams.  Use of steroids (by mouth or creams).  Previous problems with anesthetics or numbing medicine.  Previous surgery.  History of blood clots.  History of bleeding or blood problems.  Other health problems. RISKS AND COMPLICATIONS   Bleeding.  Infection.  Blood clots.  Injury to surrounding organs.  Anesthesia problems.  Injury to the baby. BEFORE THE PROCEDURE   A tube (Foley catheter) will be placed in your bladder. The Foley catheter drains the urine from your bladder into a bag. This keeps your bladder empty during surgery.  An intravenous access tube (IV) will be placed in your arm.  Hair may be removed from your pubic area and your lower abdomen. This is to prevent infection in the incision site.  You may be given an antacid medicine to drink. This will prevent acid contents in your stomach from going into your lungs if you vomit during the surgery.  You may be given an antibiotic medicine to prevent infection. PROCEDURE   You may be given medicine to numb the lower half of your body (regional anesthetic). If you were in labor, you may have already had an epidural in place which can be used in both labor and cesarean delivery. You may possibly be given medicine to make you sleep (general anesthetic) though this is not as common.  An incision will be made in your abdomen that extends to your uterus. There are 2 basic kinds of incisions:  The horizontal (transverse) incision. Horizontal incisions are used for most routine cesarean deliveries.  The vertical (up and down) incision. This is less commonly used. This is most often reserved for women who have a serious complication  (extreme prematurity) or under emergency situations.  The horizontal and vertical incisions may both be used at the same time. However, this is very uncommon.  Your baby will then be delivered. AFTER THE PROCEDURE   If you were awake during the surgery, you will see your baby right away. If you were asleep, you will see your baby as soon as you are awake.  You may breastfeed your baby after surgery.  You may be able to get up and walk the same day as the surgery. If you need to stay in bed for a period of time, you will receive help to turn, cough, and take deep breaths after surgery. This helps prevent lung problems such as pneumonia.  Do not get out of bed alone the first time after surgery. You will need help getting out of bed until you are able to do this by yourself.  You may be able to shower the day after your cesarean delivery. After the bandage (dressing) is taken off the incision site, a nurse will assist you to shower, if you like.  You will have pneumatic compressing hose placed on your feet or lower legs. These hose are used to prevent blood clots. When you are up and walking regularly, they will no longer be necessary.  Do not cross your legs when you sit.  Save any blood clots that you pass. If you pass a clot while on the toilet, do not flush it. Call for the nurse. Tell the nurse if you think you are bleeding too much or passing too many   clots.  Start drinking liquids and eating food as directed by your caregiver. If your stomach is not ready, drinking and eating too soon can cause an increase in bloating and swelling of your intestine and abdomen. This is very uncomfortable.  You will be given medicine as needed. Let your caregivers know if you are hurting. They want you to be comfortable. You may also be given an antibiotic to prevent an infection.  Your IV will be taken out when you are drinking a reasonable amount of fluids. The Foley catheter is taken out when  you are up and walking.  If your blood type is Rh negative and your baby's blood type is Rh positive, you will be given a shot of anti-D immune globulin. This shot prevents you from having Rh problems with a future pregnancy. You should get the shot even if you had your tubes tied (tubal ligation).  If you are allowed to take the baby for a walk, place the baby in the bassinet and push it. Do not carry your baby in your arms. Document Released: 06/05/2005 Document Revised: 08/28/2011 Document Reviewed: 09/30/2010 [employer] Patient Information 2014 [employer].  

## 2012-12-09 NOTE — Progress Notes (Signed)
NST today reactive. CS is scheduled 39 weeks 6/25.

## 2012-12-09 NOTE — Progress Notes (Signed)
Pulse

## 2012-12-10 MED ORDER — CEFAZOLIN SODIUM 10 G IJ SOLR
3.0000 g | INTRAMUSCULAR | Status: AC
  Administered 2012-12-11: 3 g via INTRAVENOUS
  Filled 2012-12-10: qty 3000

## 2012-12-11 ENCOUNTER — Inpatient Hospital Stay (HOSPITAL_COMMUNITY)
Admission: RE | Admit: 2012-12-11 | Discharge: 2012-12-13 | DRG: 765 | Disposition: A | Discharge status: Home / Self Care | Payer: Medicaid Other | Source: Ambulatory Visit | Attending: Obstetrics & Gynecology | Admitting: Obstetrics & Gynecology

## 2012-12-11 ENCOUNTER — Encounter (HOSPITAL_COMMUNITY)
Admission: RE | Disposition: A | Discharge status: Home / Self Care | Payer: Self-pay | Source: Ambulatory Visit | Attending: Obstetrics & Gynecology

## 2012-12-11 ENCOUNTER — Encounter (HOSPITAL_COMMUNITY): Payer: Self-pay | Admitting: Anesthesiology

## 2012-12-11 ENCOUNTER — Inpatient Hospital Stay (HOSPITAL_COMMUNITY): Payer: Medicaid Other | Admitting: Anesthesiology

## 2012-12-11 ENCOUNTER — Encounter (HOSPITAL_COMMUNITY): Payer: Self-pay | Admitting: Certified Registered"

## 2012-12-11 DIAGNOSIS — O99892 Other specified diseases and conditions complicating childbirth: Secondary | ICD-10-CM | POA: Diagnosis present

## 2012-12-11 DIAGNOSIS — O139 Gestational [pregnancy-induced] hypertension without significant proteinuria, unspecified trimester: Secondary | ICD-10-CM | POA: Diagnosis present

## 2012-12-11 DIAGNOSIS — O34219 Maternal care for unspecified type scar from previous cesarean delivery: Secondary | ICD-10-CM

## 2012-12-11 DIAGNOSIS — O10013 Pre-existing essential hypertension complicating pregnancy, third trimester: Secondary | ICD-10-CM

## 2012-12-11 DIAGNOSIS — O9989 Other specified diseases and conditions complicating pregnancy, childbirth and the puerperium: Secondary | ICD-10-CM

## 2012-12-11 DIAGNOSIS — O99013 Anemia complicating pregnancy, third trimester: Secondary | ICD-10-CM

## 2012-12-11 DIAGNOSIS — O99810 Abnormal glucose complicating pregnancy: Secondary | ICD-10-CM

## 2012-12-11 DIAGNOSIS — Z22322 Carrier or suspected carrier of Methicillin resistant Staphylococcus aureus: Secondary | ICD-10-CM

## 2012-12-11 DIAGNOSIS — Z22330 Carrier of Group B streptococcus: Secondary | ICD-10-CM

## 2012-12-11 SURGERY — Surgical Case
Anesthesia: Regional | Site: Abdomen | Wound class: Clean Contaminated

## 2012-12-11 MED ORDER — MORPHINE SULFATE (PF) 0.5 MG/ML IJ SOLN
INTRAMUSCULAR | Status: DC | PRN
  Administered 2012-12-11: .15 mg via INTRATHECAL

## 2012-12-11 MED ORDER — 0.9 % SODIUM CHLORIDE (POUR BTL) OPTIME
TOPICAL | Status: DC | PRN
  Administered 2012-12-11: 1000 mL

## 2012-12-11 MED ORDER — WITCH HAZEL-GLYCERIN EX PADS: 1.0000 "application " | MEDICATED_PAD | CUTANEOUS | Status: DC | PRN

## 2012-12-11 MED ORDER — SENNOSIDES-DOCUSATE SODIUM 8.6-50 MG PO TABS
2.0000 | ORAL_TABLET | Freq: Every day | ORAL | Status: DC
  Administered 2012-12-11 – 2012-12-12 (×2): 2 via ORAL

## 2012-12-11 MED ORDER — FENTANYL CITRATE 0.05 MG/ML IJ SOLN
INTRAMUSCULAR | Status: DC | PRN
  Administered 2012-12-11: 25 ug via INTRATHECAL

## 2012-12-11 MED ORDER — BISACODYL 10 MG RE SUPP: 10.0000 mg | Freq: Every day | RECTAL | Status: DC | PRN

## 2012-12-11 MED ORDER — OXYTOCIN 10 UNIT/ML IJ SOLN
INTRAMUSCULAR | Status: AC
  Filled 2012-12-11: qty 4

## 2012-12-11 MED ORDER — LACTATED RINGERS IV SOLN
INTRAVENOUS | Status: DC
  Administered 2012-12-11 – 2012-12-12 (×2): via INTRAVENOUS

## 2012-12-11 MED ORDER — DEXTROSE 5 % IV SOLN
1.0000 ug/kg/h | INTRAVENOUS | Status: DC | PRN
  Filled 2012-12-11: qty 2

## 2012-12-11 MED ORDER — SCOPOLAMINE 1 MG/3DAYS TD PT72
1.0000 | MEDICATED_PATCH | Freq: Once | TRANSDERMAL | Status: DC
  Filled 2012-12-11: qty 1

## 2012-12-11 MED ORDER — DIPHENHYDRAMINE HCL 50 MG/ML IJ SOLN: 25.0000 mg | INTRAMUSCULAR | Status: DC | PRN

## 2012-12-11 MED ORDER — NALOXONE HCL 0.4 MG/ML IJ SOLN: 0.4000 mg | INTRAMUSCULAR | Status: DC | PRN

## 2012-12-11 MED ORDER — FLEET ENEMA 7-19 GM/118ML RE ENEM: 1.0000 | ENEMA | Freq: Every day | RECTAL | Status: DC | PRN

## 2012-12-11 MED ORDER — ZOLPIDEM TARTRATE 5 MG PO TABS: 5.0000 mg | ORAL_TABLET | Freq: Every evening | ORAL | Status: DC | PRN

## 2012-12-11 MED ORDER — MORPHINE SULFATE 0.5 MG/ML IJ SOLN
INTRAMUSCULAR | Status: AC
  Filled 2012-12-11: qty 10

## 2012-12-11 MED ORDER — MENTHOL 3 MG MT LOZG: 1.0000 | LOZENGE | OROMUCOSAL | Status: DC | PRN

## 2012-12-11 MED ORDER — FERROUS SULFATE 325 (65 FE) MG PO TABS
325.0000 mg | ORAL_TABLET | Freq: Two times a day (BID) | ORAL | Status: DC
  Administered 2012-12-12 – 2012-12-13 (×3): 325 mg via ORAL
  Filled 2012-12-11 (×3): qty 1

## 2012-12-11 MED ORDER — DIPHENHYDRAMINE HCL 25 MG PO CAPS: 25.0000 mg | ORAL_CAPSULE | Freq: Four times a day (QID) | ORAL | Status: DC | PRN

## 2012-12-11 MED ORDER — CEFAZOLIN SODIUM-DEXTROSE 2-3 GM-% IV SOLR
INTRAVENOUS | Status: AC
  Filled 2012-12-11: qty 50

## 2012-12-11 MED ORDER — SODIUM CHLORIDE 0.9 % IJ SOLN: 3.0000 mL | INTRAMUSCULAR | Status: DC | PRN

## 2012-12-11 MED ORDER — LACTATED RINGERS IV SOLN
INTRAVENOUS | Status: DC | PRN
  Administered 2012-12-11: 12:00:00 via INTRAVENOUS

## 2012-12-11 MED ORDER — LACTATED RINGERS IV SOLN: Freq: Once | INTRAVENOUS | Status: DC

## 2012-12-11 MED ORDER — ONDANSETRON HCL 4 MG/2ML IJ SOLN: 4.0000 mg | INTRAMUSCULAR | Status: DC | PRN

## 2012-12-11 MED ORDER — OXYCODONE-ACETAMINOPHEN 5-325 MG PO TABS
1.0000 | ORAL_TABLET | ORAL | Status: DC | PRN
  Administered 2012-12-12 (×2): 1 via ORAL
  Administered 2012-12-13: 2 via ORAL
  Filled 2012-12-11: qty 2
  Filled 2012-12-11 (×3): qty 1

## 2012-12-11 MED ORDER — SIMETHICONE 80 MG PO CHEW
80.0000 mg | CHEWABLE_TABLET | Freq: Three times a day (TID) | ORAL | Status: DC
  Administered 2012-12-11 – 2012-12-13 (×6): 80 mg via ORAL

## 2012-12-11 MED ORDER — IBUPROFEN 600 MG PO TABS
600.0000 mg | ORAL_TABLET | Freq: Four times a day (QID) | ORAL | Status: DC
  Administered 2012-12-12 – 2012-12-13 (×6): 600 mg via ORAL
  Filled 2012-12-11 (×5): qty 1

## 2012-12-11 MED ORDER — DIPHENHYDRAMINE HCL 25 MG PO CAPS: 25.0000 mg | ORAL_CAPSULE | ORAL | Status: DC | PRN

## 2012-12-11 MED ORDER — KETOROLAC TROMETHAMINE 30 MG/ML IJ SOLN
30.0000 mg | Freq: Four times a day (QID) | INTRAMUSCULAR | Status: AC | PRN
  Administered 2012-12-11: 30 mg via INTRAVENOUS

## 2012-12-11 MED ORDER — FENTANYL CITRATE 0.05 MG/ML IJ SOLN
INTRAMUSCULAR | Status: AC
  Filled 2012-12-11: qty 2

## 2012-12-11 MED ORDER — LANOLIN HYDROUS EX OINT: 1.0000 "application " | TOPICAL_OINTMENT | CUTANEOUS | Status: DC | PRN

## 2012-12-11 MED ORDER — SIMETHICONE 80 MG PO CHEW: 80.0000 mg | CHEWABLE_TABLET | ORAL | Status: DC | PRN

## 2012-12-11 MED ORDER — ONDANSETRON HCL 4 MG/2ML IJ SOLN
INTRAMUSCULAR | Status: DC | PRN
  Administered 2012-12-11: 4 mg via INTRAVENOUS

## 2012-12-11 MED ORDER — METOCLOPRAMIDE HCL 5 MG/ML IJ SOLN: 10.0000 mg | Freq: Three times a day (TID) | INTRAMUSCULAR | Status: DC | PRN

## 2012-12-11 MED ORDER — ONDANSETRON HCL 4 MG/2ML IJ SOLN: 4.0000 mg | Freq: Three times a day (TID) | INTRAMUSCULAR | Status: DC | PRN

## 2012-12-11 MED ORDER — MEASLES, MUMPS & RUBELLA VAC ~~LOC~~ INJ
0.5000 mL | INJECTION | Freq: Once | SUBCUTANEOUS | Status: DC
  Filled 2012-12-11: qty 0.5

## 2012-12-11 MED ORDER — DIBUCAINE 1 % RE OINT: 1.0000 "application " | TOPICAL_OINTMENT | RECTAL | Status: DC | PRN

## 2012-12-11 MED ORDER — DIPHENHYDRAMINE HCL 50 MG/ML IJ SOLN: 12.5000 mg | INTRAMUSCULAR | Status: DC | PRN

## 2012-12-11 MED ORDER — NALBUPHINE HCL 10 MG/ML IJ SOLN
5.0000 mg | INTRAMUSCULAR | Status: DC | PRN
  Administered 2012-12-11: 10 mg via SUBCUTANEOUS
  Filled 2012-12-11 (×2): qty 1

## 2012-12-11 MED ORDER — TETANUS-DIPHTH-ACELL PERTUSSIS 5-2.5-18.5 LF-MCG/0.5 IM SUSP: 0.5000 mL | Freq: Once | INTRAMUSCULAR | Status: DC

## 2012-12-11 MED ORDER — ONDANSETRON HCL 4 MG/2ML IJ SOLN
INTRAMUSCULAR | Status: AC
  Filled 2012-12-11: qty 2

## 2012-12-11 MED ORDER — BUPIVACAINE IN DEXTROSE 0.75-8.25 % IT SOLN
INTRATHECAL | Status: DC | PRN
  Administered 2012-12-11: 1.6 mL via INTRATHECAL

## 2012-12-11 MED ORDER — PHENYLEPHRINE HCL 10 MG/ML IJ SOLN
INTRAMUSCULAR | Status: DC | PRN
  Administered 2012-12-11: 80 ug via INTRAVENOUS

## 2012-12-11 MED ORDER — OXYTOCIN 40 UNITS IN LACTATED RINGERS INFUSION - SIMPLE MED: 62.5000 mL/h | INTRAVENOUS | Status: AC

## 2012-12-11 MED ORDER — KETOROLAC TROMETHAMINE 30 MG/ML IJ SOLN
INTRAMUSCULAR | Status: AC
  Administered 2012-12-11: 30 mg via INTRAVENOUS
  Filled 2012-12-11: qty 1

## 2012-12-11 MED ORDER — SCOPOLAMINE 1 MG/3DAYS TD PT72
MEDICATED_PATCH | TRANSDERMAL | Status: AC
  Administered 2012-12-11: 1.5 mg
  Filled 2012-12-11: qty 1

## 2012-12-11 MED ORDER — KETOROLAC TROMETHAMINE 30 MG/ML IJ SOLN
30.0000 mg | Freq: Four times a day (QID) | INTRAMUSCULAR | Status: AC | PRN
  Filled 2012-12-11: qty 1

## 2012-12-11 MED ORDER — NALBUPHINE HCL 10 MG/ML IJ SOLN
5.0000 mg | INTRAMUSCULAR | Status: DC | PRN
  Filled 2012-12-11: qty 1

## 2012-12-11 MED ORDER — OXYTOCIN 10 UNIT/ML IJ SOLN
40.0000 [IU] | INTRAVENOUS | Status: DC | PRN
  Administered 2012-12-11: 40 [IU] via INTRAVENOUS

## 2012-12-11 MED ORDER — LACTATED RINGERS IV SOLN
INTRAVENOUS | Status: DC
  Administered 2012-12-11 (×3): via INTRAVENOUS

## 2012-12-11 MED ORDER — PRENATAL MULTIVITAMIN CH
1.0000 | ORAL_TABLET | Freq: Every day | ORAL | Status: DC
  Administered 2012-12-12 – 2012-12-13 (×2): 1 via ORAL
  Filled 2012-12-11: qty 1

## 2012-12-11 MED ORDER — ONDANSETRON HCL 4 MG PO TABS: 4.0000 mg | ORAL_TABLET | ORAL | Status: DC | PRN

## 2012-12-11 MED ORDER — ACETAMINOPHEN 10 MG/ML IV SOLN
1000.0000 mg | Freq: Four times a day (QID) | INTRAVENOUS | Status: AC | PRN
  Filled 2012-12-11: qty 100

## 2012-12-11 SURGICAL SUPPLY — 32 items
BARRIER ADHS 3X4 INTERCEED (GAUZE/BANDAGES/DRESSINGS) IMPLANT
BRR ADH 4X3 ABS CNTRL BYND (GAUZE/BANDAGES/DRESSINGS)
CATH FOLEY LATEX FREE 14FR (CATHETERS) ×2
CATH FOLEY LF 14FR (CATHETERS) IMPLANT
CLAMP CORD UMBIL (MISCELLANEOUS) IMPLANT
CLOTH BEACON ORANGE TIMEOUT ST (SAFETY) ×2 IMPLANT
DRAPE LG THREE QUARTER DISP (DRAPES) ×2 IMPLANT
DRSG OPSITE POSTOP 4X10 (GAUZE/BANDAGES/DRESSINGS) ×2 IMPLANT
DURAPREP 26ML APPLICATOR (WOUND CARE) ×2 IMPLANT
ELECT REM PT RETURN 9FT ADLT (ELECTROSURGICAL) ×2
ELECTRODE REM PT RTRN 9FT ADLT (ELECTROSURGICAL) ×1 IMPLANT
EXTRACTOR VACUUM KIWI (MISCELLANEOUS) IMPLANT
GLOVE BIO SURGEON STRL SZ 6.5 (GLOVE) ×1 IMPLANT
GLOVE BIOGEL PI IND STRL 7.0 (GLOVE) ×1 IMPLANT
GLOVE BIOGEL PI INDICATOR 7.0 (GLOVE) ×4
GLOVE SURG SS PI 6.5 STRL IVOR (GLOVE) ×1 IMPLANT
GLOVE SURG SS PI 7.0 STRL IVOR (GLOVE) ×4 IMPLANT
GOWN STRL REIN XL XLG (GOWN DISPOSABLE) ×4 IMPLANT
KIT ABG SYR 3ML LUER SLIP (SYRINGE) IMPLANT
NDL HYPO 25X5/8 SAFETYGLIDE (NEEDLE) IMPLANT
NEEDLE HYPO 25X5/8 SAFETYGLIDE (NEEDLE) IMPLANT
NS IRRIG 1000ML POUR BTL (IV SOLUTION) ×2 IMPLANT
PACK C SECTION WH (CUSTOM PROCEDURE TRAY) ×2 IMPLANT
PAD OB MATERNITY 4.3X12.25 (PERSONAL CARE ITEMS) ×2 IMPLANT
SUT PLAIN 2 0 XLH (SUTURE) ×1 IMPLANT
SUT VIC AB 0 CT1 36 (SUTURE) ×11 IMPLANT
SUT VIC AB 2-0 CT1 27 (SUTURE) ×2
SUT VIC AB 2-0 CT1 TAPERPNT 27 (SUTURE) ×1 IMPLANT
SUT VIC AB 4-0 PS2 27 (SUTURE) ×2 IMPLANT
TOWEL OR 17X24 6PK STRL BLUE (TOWEL DISPOSABLE) ×6 IMPLANT
TRAY FOLEY CATH 14FR (SET/KITS/TRAYS/PACK) IMPLANT
WATER STERILE IRR 1000ML POUR (IV SOLUTION) ×2 IMPLANT

## 2012-12-11 NOTE — Progress Notes (Signed)
Perineum assessed. Bloody packing noted near vaginal area.

## 2012-12-11 NOTE — Op Note (Signed)
Lynnell Chad PROCEDURE DATE: 12/11/2012  PREOPERATIVE DIAGNOSIS: Intrauterine pregnancy at  [redacted]w[redacted]d weeks gestation; prior cesarean section, declined TOLAC  POSTOPERATIVE DIAGNOSIS: The same  PROCEDURE: Repeat Low Transverse Cesarean Section  SURGEON:  Scheryl Darter, MD  ASSISTANT:  Napoleon Form, MD  INDICATIONS: Krystal Hartman is a 22 y.o. G3P1011 at [redacted]w[redacted]d here for cesarean section secondary to the indications listed under preoperative diagnosis; please see preoperative note for further details.  The risks of cesarean section were discussed with the patient including but were not limited to: bleeding which may require transfusion or reoperation; infection which may require antibiotics; injury to bowel, bladder, ureters or other surrounding organs; injury to the fetus; need for additional procedures including hysterectomy in the event of a life-threatening hemorrhage; placental abnormalities wth subsequent pregnancies, incisional problems, thromboembolic phenomenon and other postoperative/anesthesia complications.   The patient concurred with the proposed plan, giving informed written consent for the procedure.    FINDINGS:  Viable female infant in cephalic presentation.  Apgars 8 and 9.  Clear amniotic fluid.  Intact placenta, three vessel cord.  Normal uterus, fallopian tubes and ovaries bilaterally.  ANESTHESIA: Spinal-Epidural INTRAVENOUS FLUIDS: 2400 ml ESTIMATED BLOOD LOSS: 800 ml URINE OUTPUT:  350 ml SPECIMENS: Placenta sent to L&D COMPLICATIONS: None immediate  PROCEDURE IN DETAIL:  The patient preoperatively received intravenous antibiotics and had sequential compression devices applied to her lower extremities.  She was then taken to the operating room where spinal anesthesia was administered and was found to be adequate. She was then placed in a dorsal supine position with a leftward tilt, and prepped and draped in a sterile manner.  A foley catheter was placed into her bladder  and attached to constant gravity.  After an adequate timeout was performed, a Pfannenstiel skin incision was made with scalpel and carried through to the underlying layer of fascia. The fascia was incised in the midline, and this incision was extended bilaterally using the Mayo scissors.  Kocher clamps were applied to the superior aspect of the fascial incision and the underlying rectus muscles were dissected off bluntly and sharply.  A similar process was carried out on the inferior aspect of the fascial incision. The rectus muscles were separated in the midline bluntly and the peritoneum was entered bluntly. Alexis self-retaining retractor was placed in the abdomen. Attention was turned to the lower uterine segment where a low transverse hysterotomy incision was made with a scalpel and extended bilaterally bluntly.  Kiwi vacuum was used to deliver the infant's head with one pop-off. The infant was successfully delivered, the cord was clamped and cut and the infant was handed over to awaiting neonatology team. Uterine massage was then administered, and the placenta delivered intact with a three-vessel cord. The uterus was then cleared of clot and debris.  The hysterotomy was closed with 0 Vicryl in a running locked fashion, and an imbricating layer was also placed with the same suture. The pelvis was cleared of all clot and debris. Hemostasis was confirmed on all surfaces.  The peritoneum was reapproximated using 0 Vicryl in running stitch.  The fascia was then closed using 0 Vicryl in a running fashion.  The subcutaneous layer was irrigated, then reapproximated with 2-0 plain suture in interrupted stitches. The skin was closed with a 4-0 Vicryl subcuticular stitch. After the skin was closed, a Prevena disposable negative pressure wound therapy device was placed over the incision.  The suction was activated at a pressure of .  The adhesive was affixed  well and there were no leaks noted.  The patient  tolerated the procedure well. Sponge, lap, instrument and needle counts were correct x 2.  She was taken to the recovery room in stable condition.   Napoleon Form, MD

## 2012-12-11 NOTE — Anesthesia Postprocedure Evaluation (Signed)
  Anesthesia Post-op Note  Patient: Krystal Hartman  Procedure(s) Performed: Procedure(s): REPEAT CESAREAN SECTION (N/A)  Patient is awake, responsive, moving her legs, and has signs of resolution of her numbness. Pain and nausea are reasonably well controlled. Vital signs are stable and clinically acceptable. Oxygen saturation is clinically acceptable. There are no apparent anesthetic complications at this time. Patient is ready for discharge.

## 2012-12-11 NOTE — Anesthesia Postprocedure Evaluation (Signed)
  Anesthesia Post-op Note  Patient: Krystal Hartman  Procedure(s) Performed: Procedure(s): REPEAT CESAREAN SECTION (N/A)  Patient Location: Mother/Baby  Anesthesia Type:Regional  Level of Consciousness: awake  Airway and Oxygen Therapy: Patient Spontanous Breathing  Post-op Pain: mild  Post-op Assessment: Patient's Cardiovascular Status Stable and Respiratory Function Stable  Post-op Vital Signs: stable  Complications: No apparent anesthesia complications

## 2012-12-11 NOTE — Preoperative (Signed)
Beta Blockers   Reason not to administer Beta Blockers:b blocker taken @ 0700

## 2012-12-11 NOTE — OR Nursing (Addendum)
Inner thighs and labia retracted for exposure by S. Rockers, RN and Dr. Vassie Loll, for foley insertion by Marguerita Beards, RN. A 0.5 cm superficial skin tear was noted, 1 cm to the right of the urethra, site red and malodorous. Dr. Debroah Loop notified.

## 2012-12-11 NOTE — Consult Note (Signed)
Neonatology Note:   Attendance at C-section:    I was asked by Dr. Debroah Loop to attend this repeat C/S at term. The mother is a G3P1A1 A pos, GBS pos with chronic HTN and possible superimposed PIH and morbid obesity, on labetalol. ROM at delivery, fluid clear. Infant vigorous with good spontaneous cry and tone. Needed only bulb suctioning. Ap 8/9. Lungs clear to ausc in DR. Second degree hypospadias noted with hooded foreskin, shown to [facility] (support person) and informed mother of this finding. Baby appears LGA. To CN to care of Pediatrician.   Doretha Sou, MD

## 2012-12-11 NOTE — H&P (Signed)
Krystal Hartman is a 22 y.o. female presenting for repeat cesarean section. [id number] [redacted]w[redacted]d Patient's last menstrual period was 03/13/2012. . Maternal Medical History:  Reason for admission: Nausea. Repeat cesarean section  Fetal activity: Perceived fetal activity is normal.    Prenatal complications: PIH.   Prenatal Complications - Diabetes: none.    OB History   Grav Para Term Preterm Abortions TAB SAB Ect Mult Living   3 1 1  0 1 0 1 0 0 1     Past Medical History  Diagnosis Date  . Hypertension   . MRSA (methicillin resistant Staphylococcus aureus) colonization     chronic intermittent problem with boils-occassionally test positive for MRSA. None present at PAT appt.  . Boil     on side  . Pregnancy induced hypertension   . PONV (postoperative nausea and vomiting) 2013    pt. states she had a lot of nausea and vomiting after her last c/s  here at [facility]   Past Surgical History  Procedure Laterality Date  . Eye surgery    . Cesarean section    . Cesarean section  06/22/2011    Procedure: CESAREAN SECTION;  Surgeon: Hal Morales, MD;  Location: [facility] OR[facility];  Service: Gynecology;  Laterality: N/A;  MRSA positive, Latex Allergy   Family History: family history includes Cancer in her maternal grandmother; Diabetes in her father, maternal aunt, and mother; and Heart disease in her father. Social History:  reports that she has never smoked. She has never used smokeless tobacco. She reports that she does not drink alcohol or use illicit drugs. No current facility-administered medications on file prior to encounter.   Current Outpatient Prescriptions on File Prior to Encounter  Medication Sig Dispense Refill  . labetalol (NORMODYNE) 200 MG tablet Take 1 tablet (200 mg total) by mouth 2 (two) times daily.  60 tablet  3     Prenatal Transfer Tool  Maternal Diabetes: No Genetic Screening: Declined Maternal Ultrasounds/Referrals: Normal Fetal Ultrasounds or other  Referrals:  None Maternal Substance Abuse:  No Significant Maternal Medications:  Meds include: Other: Labetalol Significant Maternal Lab Results:  None Other Comments: obesity   Review of Systems  Constitutional: Negative for fever.  Respiratory: Negative for cough.   Gastrointestinal: Negative for nausea.      Blood pressure 183/103, pulse 91, temperature 97.7 F (36.5 C), temperature source Oral, resp. rate 18, last menstrual period 03/13/2012, SpO2 99.00%. Maternal Exam:  Abdomen: Patient reports no abdominal tenderness. Surgical scars: low transverse.   Fundal height is 38 cm, morbid obesity.   Estimated fetal weight is 8 lb.   Fetal presentation: vertex  Introitus: not evaluated.   Cervix: not evaluated.   Physical Exam  Constitutional: She is oriented to person, place, and time. No distress.  Obesity noted  HENT:  Head: Normocephalic.  Neck: Neck supple.  Cardiovascular: Normal rate, regular rhythm and normal heart sounds.   No murmur heard. Respiratory: Effort normal and breath sounds normal. No respiratory distress.  GI:  Gravid, large pannous  Musculoskeletal: Normal range of motion.  Neurological: She is alert and oriented to person, place, and time.  Skin: Skin is warm and dry.  Psychiatric: She has a normal mood and affect. Her behavior is normal.    Prenatal labs: ABO, Rh: --/--/A POS, A POS (06/23 0930) Antibody: NEG (06/23 0930) Rubella: 2.62 (02/03 1004) RPR: NON REACTIVE (06/23 0930)  HBsAg: NEGATIVE (02/03 1004)  HIV: NON REACTIVE (04/14 0941)  GBS: Positive (06/02  0000)  Abnl 1 hr GTT, nl 3 hr Assessment/Plan: CHTN on labetalol, elevated today. Follow for possible preeclampsia. Elective repeat cesarean section 39 weeks,  Procedure and risk of pain, bleeding, infection, visceral organ damage, anesthesia discussed and her questions were answered. Consent signed. Plans IUD for [facility], breastfeeding   Krystal Hartman 12/11/2012, 10:24 AM

## 2012-12-11 NOTE — Anesthesia Preprocedure Evaluation (Signed)
Anesthesia Evaluation  Patient identified by MRN, date of birth, ID band Patient awake    Reviewed: Allergy & Precautions, H&P , Patient's Chart, lab work & pertinent test results  Airway Mallampati: IV TM Distance: >3 FB Neck ROM: full    Dental no notable dental hx.    Pulmonary  breath sounds clear to auscultation  Pulmonary exam normal       Cardiovascular Exercise Tolerance: Good hypertension, On Home Beta Blockers Rhythm:regular Rate:Normal     Neuro/Psych    GI/Hepatic   Endo/Other  Morbid obesity  Renal/GU      Musculoskeletal   Abdominal   Peds  Hematology  (+) anemia ,   Anesthesia Other Findings   Reproductive/Obstetrics                           Anesthesia Physical Anesthesia Plan  ASA: IV  Anesthesia Plan: Spinal, Combined Spinal and Epidural and Epidural   Post-op Pain Management:    Induction:   Airway Management Planned:   Additional Equipment:   Intra-op Plan:   Post-operative Plan:   Informed Consent: I have reviewed the patients History and Physical, chart, labs and discussed the procedure including the risks, benefits and alternatives for the proposed anesthesia with the patient or authorized representative who has indicated his/her understanding and acceptance.   Dental Advisory Given  Plan Discussed with: CRNA  Anesthesia Plan Comments: (Lab work confirmed with CRNA in room. Platelets okay. Discussed spinal anesthetic, and patient consents to the procedure:  included risk of possible headache,backache, failed block, allergic reaction, and nerve injury. This patient was asked if she had any questions or concerns before the procedure started. )        Anesthesia Quick Evaluation

## 2012-12-11 NOTE — Transfer of Care (Signed)
Immediate Anesthesia Transfer of Care Note  Patient: Krystal Hartman  Procedure(s) Performed: Procedure(s): REPEAT CESAREAN SECTION (N/A)  Patient Location: PACU  Anesthesia Type:Spinal and Epidural  Level of Consciousness: awake, alert  and oriented  Airway & Oxygen Therapy: Patient Spontanous Breathing  Post-op Assessment: Report given to PACU RN and Post -op Vital signs reviewed and stable  Post vital signs: Reviewed and stable  Complications: No apparent anesthesia complications

## 2012-12-11 NOTE — Anesthesia Procedure Notes (Signed)
Spinal  Patient location during procedure: OR Start time: 12/11/2012 11:15 AM Staffing Anesthesiologist: Keyion Knack A. Performed by: anesthesiologist  Preanesthetic Checklist Completed: patient identified, site marked, surgical consent, pre-op evaluation, timeout performed, IV checked, risks and benefits discussed and monitors and equipment checked Spinal Block Patient position: sitting Prep: site prepped and draped and DuraPrep Patient monitoring: cardiac monitor, continuous pulse ox, blood pressure and heart rate Approach: midline Location: L4-5 Injection technique: catheter Needle Needle type: Tuohy and Sprotte  Needle gauge: 24 G Needle length: 12.7 cm Needle insertion depth: 10 cm Catheter type: closed end flexible Catheter size: 19 g Catheter at skin depth: 16 cm Assessment Sensory level: T4 Additional Notes 17Ga Touhy LOR with air @ 10 cm. sprotte needle through the epidural needle. CSF clear free flow, Transient paresthesia right leg. Spinal anesthetic injected and spinal needle withdrawn. Epidural catheter then threaded into epidural space 6cm and epidural needle withdrawn. Sterile dressing applied. Patient placed supine with LUD. Sensory level to cold at T4. Patient tolerated procedure well.

## 2012-12-12 ENCOUNTER — Encounter (HOSPITAL_COMMUNITY): Payer: Self-pay | Admitting: Obstetrics & Gynecology

## 2012-12-12 LAB — MRSA PCR SCREENING: MRSA by PCR: NEGATIVE

## 2012-12-12 LAB — CBC
Hemoglobin: 8 g/dL — ABNORMAL LOW (ref 12.0–15.0)
MCH: 23.5 pg — ABNORMAL LOW (ref 26.0–34.0)
RBC: 3.4 MIL/uL — ABNORMAL LOW (ref 3.87–5.11)
WBC: 6.9 10*3/uL (ref 4.0–10.5)

## 2012-12-12 MED ORDER — AMLODIPINE BESYLATE 10 MG PO TABS
10.0000 mg | ORAL_TABLET | Freq: Every day | ORAL | Status: DC
  Administered 2012-12-12 – 2012-12-13 (×2): 10 mg via ORAL
  Filled 2012-12-12 (×2): qty 1

## 2012-12-12 NOTE — Progress Notes (Signed)
Subjective: Postpartum Day 1: Cesarean Delivery Patient feeling well with no complaints.  Up ad lib, tolerating PO, +flatus.  Objective: Vital signs in last 24 hours: Temp:  [97.8 F (36.6 C)-99.1 F (37.3 C)] 98.6 F (37 C) (06/26 0445) Pulse Rate:  [69-87] 79 (06/26 0445) Resp:  [18-20] 20 (06/26 0445) BP: (133-160)/(63-95) 149/63 mmHg (06/26 0445) SpO2:  [95 %-99 %] 97 % (06/26 0445) Weight:  [186.882 kg (412 lb)] 186.882 kg (412 lb) (06/25 1540)  Physical Exam:  General: alert, cooperative and no distress Lochia: appropriate Uterine Fundus: firm Incision: no significant drainage, no significant erythema; wound vac in place. DVT Evaluation: no appreciable edema (difficult to discern secondary to obesity).   Recent Labs  12/12/12 0500  HGB 8.0*  HCT 25.5*    Assessment/Plan: Status post Cesarean section. Doing well postoperatively.  Continue current care. Starting Amlodipine for HTN.  Everlene Other 12/12/2012, 10:07 AM    I saw and examined patient along with student and agree with above note.   Angalina Ante 12/13/2012 3:27 PM

## 2012-12-12 NOTE — Progress Notes (Signed)
UR chart review completed.  

## 2012-12-13 LAB — TYPE AND SCREEN
ABO/RH(D): A POS
Antibody Screen: NEGATIVE
Unit division: 0

## 2012-12-13 MED ORDER — OXYCODONE-ACETAMINOPHEN 5-325 MG PO TABS: 1.0000 | ORAL_TABLET | ORAL | Status: DC | PRN

## 2012-12-13 MED ORDER — IBUPROFEN 600 MG PO TABS: 600.0000 mg | ORAL_TABLET | Freq: Four times a day (QID) | ORAL | Status: DC

## 2012-12-13 NOTE — Discharge Summary (Signed)
Obstetric Discharge Summary Reason for Admission: cesarean section Prenatal Procedures: NST and ultrasound Intrapartum Procedures: cesarean: low cervical, transverse Postpartum Procedures: none Complications-Operative and Postpartum: none Pt presented for RLTCS;  See op note.  PP course has been uneventful.  Baby is bottle feeding.  Pt plans Mirena IUD.  Filed Vitals:   12/13/12 0553  BP: 147/82  Pulse: 90  Temp: 98.3 F (36.8 C)  Resp: 18    Hemoglobin  Date Value Range Status  12/12/2012 8.0* 12.0 - 15.0 g/dL Final     HCT  Date Value Range Status  12/12/2012 25.5* 36.0 - 46.0 % Final   Review of Systems   Constitutional: Negative for fever and chills Eyes: Negative for visual disturbances Respiratory: Negative for shortness of breath, dyspnea Cardiovascular: Negative for chest pain or palpitations  Gastrointestinal: Negative for vomiting, diarrhea and constipation; + flatus Genitourinary: Negative for dysuria and urgency Musculoskeletal: Negative for back pain, joint pain, myalgias  Neurological: Negative for dizziness and headaches   Physical Exam:  General: alert, cooperative and no distress Lungs:  CTAB Heart: RR&R Abdomen:  Soft, nontender;  + Bowel sounds Lochia: appropriate Uterine Fundus: firm Incision: healing well, no significant drainage, wound vac in place DVT Evaluation: No evidence of DVT seen on physical exam.  Discharge Diagnoses: Term Pregnancy-delivered  Discharge Information: Date: 12/13/2012 Activity: pelvic rest Diet: routine Medications: Ibuprofen and Percocet; Nrovasc Condition: stable Instructions: refer to practice specific booklet Discharge to: home Follow-up Information   Follow up with [facility]. Schedule an appointment as soon as possible for a visit on 12/16/2012.      Newborn Data: Live born female  Birth Weight: 9 lb 5.7 oz (4245 g) APGAR: 8, 9  Home with mother.  CRESENZO-DISHMAN,Penney Domanski 12/13/2012, 7:35  AM

## 2012-12-15 ENCOUNTER — Inpatient Hospital Stay (HOSPITAL_COMMUNITY)
Admission: AD | Admit: 2012-12-15 | Discharge: 2012-12-15 | Disposition: A | Discharge status: Home / Self Care | Payer: Medicaid Other | Source: Ambulatory Visit | Attending: Obstetrics & Gynecology | Admitting: Obstetrics & Gynecology

## 2012-12-15 DIAGNOSIS — Z5189 Encounter for other specified aftercare: Secondary | ICD-10-CM

## 2012-12-15 DIAGNOSIS — O909 Complication of the puerperium, unspecified: Secondary | ICD-10-CM | POA: Insufficient documentation

## 2012-12-15 NOTE — MAU Note (Signed)
Pt presents with complaints of wound vac not working properly. States she had a cesarean section on Wednesday June the 25th and wound vac was placed then. Denies any other problems at this time.

## 2012-12-15 NOTE — MAU Provider Note (Signed)
History     CSN: [id number]  Arrival date and time: 12/15/12 1000   None     Chief Complaint  Patient presents with  . Wound Check   HPI 22 y.o. [id number] s/p RLTCS on 12/11/12 here for problems with her wound vac. Prevena negative pressure wound dressing placed postoperatively due to patient morbid obesity. Pt states that since yesterday, the alarm has sounded on the wound vac unit 3 times, and the yellow light has come on. This usually indicates a break in the seal around the dressing. She denies any abdominal pain or fever, nausea or vomiting.  OB History   Grav Para Term Preterm Abortions TAB SAB Ect Mult Living   3 2 2  0 1 0 1 0 0 2      Past Medical History  Diagnosis Date  . Hypertension   . MRSA (methicillin resistant Staphylococcus aureus) colonization     chronic intermittent problem with boils-occassionally test positive for MRSA. None present at PAT appt.  . Boil     on side  . Pregnancy induced hypertension   . PONV (postoperative nausea and vomiting) 2013    pt. states she had a lot of nausea and vomiting after her last c/s  here at [facility]    Past Surgical History  Procedure Laterality Date  . Eye surgery    . Cesarean section    . Cesarean section  06/22/2011    Procedure: CESAREAN SECTION;  Surgeon: Hal Morales, MD;  Location: [facility];  Service: Gynecology;  Laterality: N/A;  MRSA positive, Latex Allergy  . Cesarean section N/A 12/11/2012    Procedure: REPEAT CESAREAN SECTION;  Surgeon: Adam Phenix, MD;  Location: [facility];  Service: Obstetrics;  Laterality: N/A;    Family History  Problem Relation Age of Onset  . Diabetes Mother   . Diabetes Maternal Aunt   . Diabetes Father   . Heart disease Father   . Cancer Maternal Grandmother     History  Substance Use Topics  . Smoking status: Never Smoker   . Smokeless tobacco: Never Used  . Alcohol Use: No    Allergies:  Allergies  Allergen Reactions  . Latex Hives and Itching  .  Pork-Derived Products Other (See Comments)    Religous reasons does not eat    Prescriptions prior to admission  Medication Sig Dispense Refill  . acetaminophen-codeine (TYLENOL #3) 300-30 MG per tablet Take 1 tablet by mouth daily as needed (for tooth pain).      Marland Kitchen ibuprofen (ADVIL,MOTRIN) 600 MG tablet Take 1 tablet (600 mg total) by mouth every 6 (six) hours.  30 tablet  1  . labetalol (NORMODYNE) 200 MG tablet Take 1 tablet (200 mg total) by mouth 2 (two) times daily.  60 tablet  3  . oxyCODONE-acetaminophen (PERCOCET/ROXICET) 5-325 MG per tablet Take 1-2 tablets by mouth every 4 (four) hours as needed.  40 tablet  0  . Prenatal Vit-Fe Fumarate-FA (PRENATAL MULTIVITAMIN) TABS Take 1 tablet by mouth daily at 12 noon.      . sodium chloride (OCEAN) 0.65 % nasal spray Place 1 spray into the nose as needed for congestion.        ROS  See HPI  Physical Exam   Blood pressure 155/92, pulse 86, temperature 97.8 F (36.6 C), resp. rate 18, last menstrual period 03/13/2012, unknown if currently breastfeeding.  Physical Exam GEN:  WNWD, no distress (morbidly obese HEENT:  NCAT, EOMI, conjunctiva clear ABD:  Wound dressing intact. Strong odor under pannus and significant amount of moisture but no redness or warmth. The lower edge of the dressing is quite loose but appears well adhered immediately adjacent to the sponge dressing.   Battery operated unit shows green light throughout visit.   MAU Course  Procedures    Assessment and Plan  22 y.o. [id number] at POD #4 here for check of wound vac - Operating well most of the time, green light currently. - Likely alarming due to inferior edge of tape coming loose - Moisture and odor to skin around wound site but no signs of infection - Pt instructed to clean gently and keep dry steri-pad under pannus to absorb moisture - F/U tomorrow in [facility] for removal of wound vac  Napoleon Form 12/15/2012, 10:48 AM

## 2012-12-16 ENCOUNTER — Encounter: Payer: Self-pay | Admitting: Obstetrics & Gynecology

## 2012-12-16 ENCOUNTER — Ambulatory Visit (INDEPENDENT_AMBULATORY_CARE_PROVIDER_SITE_OTHER): Payer: Medicaid Other | Admitting: Obstetrics & Gynecology

## 2012-12-16 VITALS — BP 149/94 | HR 100 | Temp 99.8°F | Ht 64.0 in | Wt >= 6400 oz

## 2012-12-16 DIAGNOSIS — Z98891 History of uterine scar from previous surgery: Secondary | ICD-10-CM | POA: Insufficient documentation

## 2012-12-16 DIAGNOSIS — Z09 Encounter for follow-up examination after completed treatment for conditions other than malignant neoplasm: Secondary | ICD-10-CM

## 2012-12-16 NOTE — Progress Notes (Signed)
OBSTETRICS CLINIC PROGRESS NOTE  History:  22 y.o. [id number] here today for incision wound vac dressing removal.  She had a Prevena placed after her cesarean section on 12/11/12; she reports that it started beeping yesterday. No postoperative concerns.  Patient and baby are doing well.  The following portions of the patient's history were reviewed and updated as appropriate: allergies, current medications, past family history, past medical history, past social history, past surgical history and problem list.  Review of Systems:  Pertinent items are noted in HPI.  Objective:  Physical Exam BP 149/94  Pulse 100  Temp(Src) 99.8 F (37.7 C) (Oral)  Ht 5\' 4"  (1.626 m)  Wt 400 lb (181.439 kg)  BMI 68.63 kg/m2  LMP 03/13/2012  Breastfeeding? Yes Gen: NAD Abd: Soft, obese, significant pannus noted, nontender and nondistended Incision: Wound vac dressing noted to be detached completely.  Foul odor noted from area.  Incision cleaned.  No signs of cellulitis seen.  No drainage seen  Assessment & Plan:  Incisional wound vac dressing removed  Proper incisional care emphasized She will return for postpartum check or earlier if indicated

## 2012-12-16 NOTE — Patient Instructions (Signed)
Return to clinic for any obstetric concerns or go to [facility] for evaluation  

## 2012-12-20 NOTE — Progress Notes (Signed)
NST reactive on 12/06/12 

## 2012-12-23 ENCOUNTER — Other Ambulatory Visit: Payer: Self-pay | Admitting: Obstetrics & Gynecology

## 2012-12-23 ENCOUNTER — Telehealth: Payer: Self-pay | Admitting: *Deleted

## 2012-12-23 DIAGNOSIS — O1002 Pre-existing essential hypertension complicating childbirth: Secondary | ICD-10-CM

## 2012-12-23 MED ORDER — LABETALOL HCL 200 MG PO TABS: 400.0000 mg | ORAL_TABLET | Freq: Two times a day (BID) | ORAL | Status: DC

## 2012-12-23 NOTE — Telephone Encounter (Signed)
Krystal Hartman called and left a message on voicemail 12/19/12 stating she is the visiting nurse and is calling in a blood pressure- states it is 166/84 which is above their guidelines. States [name] delivered 12/11/12 and is taking labetolol 200 mg bid- states she had a headache yesterday and today, but no visual changes or abdominal pain besides from incision. States she has 2+ edema in legs/feet/ankles. States pt. Phone number is [phone] or husband number [phone].

## 2012-12-23 NOTE — Telephone Encounter (Signed)
Discussed visiting nurse bp and  Assessment to Dr. Macon Large.  Instructed to call patient and discuss her symptoms now- if has headache, or visual changes tell her to come to MAU for evaluation. If she does not have headache or visual changes increase labetolol to 400mg  po bid.  Called Nikaya and she states she is doing better, states her edema has gone down, denies headaches today, denies visual changes.  Instructed her I am sending a prescription in for labetolol 400 mg po bid to her pharmacy. Also instructed her if she has visual changes or headaches return to come to mau. Patient  Voices understanding. She states the only problem she is having is her back is still alittle sore around the epidural site. We discussed there may be some bruising - she states she is taking her pain med and it is helping. Instructed her to call anesthesia if problems persisit with the epidural site.

## 2013-01-13 ENCOUNTER — Ambulatory Visit (INDEPENDENT_AMBULATORY_CARE_PROVIDER_SITE_OTHER): Payer: Medicaid Other | Admitting: Obstetrics and Gynecology

## 2013-01-13 ENCOUNTER — Encounter: Payer: Self-pay | Admitting: Obstetrics and Gynecology

## 2013-01-13 DIAGNOSIS — Z30430 Encounter for insertion of intrauterine contraceptive device: Secondary | ICD-10-CM

## 2013-01-13 LAB — POCT PREGNANCY, URINE: Preg Test, Ur: NEGATIVE

## 2013-01-13 MED ORDER — LEVONORGESTREL 20 MCG/24HR IU IUD
INTRAUTERINE_SYSTEM | Freq: Once | INTRAUTERINE | Status: AC
  Administered 2013-01-13: 1 via INTRAUTERINE

## 2013-01-13 NOTE — Progress Notes (Signed)
  Subjective:     Krystal Hartman is a 22 y.o. female who presents for a postpartum visit. She is 5 weeks postpartum following a low cervical transverse Cesarean section. I have fully reviewed the prenatal and intrapartum course. The delivery was at 39 gestational weeks. Outcome: repeat cesarean section, low transverse incision. Anesthesia: spinal. Postpartum course has been uncomplicated. Baby's course has been uncomplicated. Baby is feeding by bottle -  . Bleeding no bleeding. Bowel function is normal. Bladder function is normal. Patient is sexually active. Contraception method is condoms. Postpartum depression screening: negative.     Review of Systems A comprehensive review of systems was negative.   Objective:    BP 156/96  Pulse 87  Temp(Src) 97.8 F (36.6 C) (Oral)  Ht 5\' 5"  (1.651 m)  Wt 386 lb 11.2 oz (175.406 kg)  BMI 64.35 kg/m2  LMP 01/11/2013  Breastfeeding? No  General:  alert, cooperative and no distress   Breasts:  inspection negative, no nipple discharge or bleeding, no masses or nodularity palpable  Lungs: clear to auscultation bilaterally  Heart:  regular rate and rhythm  Abdomen: soft, non-tender; bowel sounds normal; no masses,  no organomegaly and obese. Incision healed very well   Vulva:  normal  Vagina: normal vagina, no discharge, exudate, lesion, or erythema  Cervix:  closed and long  Corpus: normal size, contour, position, consistency, mobility, non-tender  Adnexa:  no mass, fullness, tenderness  Rectal Exam: Not performed.        Assessment:     Normal postpartum exam. Pap smear not done at today's visit- due in February 2015.   Plan:    1. Contraception: IUD Patient medically cleared to resume all activities of daily living.  Patient to continue taking labetalol daily. Patient did not take it today 2. IUD Procedure Note Patient identified, informed consent performed, signed copy in chart, time out was performed.  Urine pregnancy test  negative.  Speculum placed in the vagina.  Cervix visualized.  Cleaned with Betadine x 2.  Grasped anteriorly with a single tooth tenaculum.  Uterus sounded to 6.5 cm.  Mirena IUD placed per manufacturer's recommendations.  Strings trimmed to 3 cm. Tenaculum was removed, good hemostasis noted.  Patient tolerated procedure well.   Patient given post procedure instructions and Mirena care card with expiration date.  Patient is asked to check IUD strings periodically.  3. Follow up in: 4 weeks for IUD check or as needed.

## 2013-01-28 ENCOUNTER — Encounter (HOSPITAL_COMMUNITY): Payer: Self-pay | Admitting: *Deleted

## 2013-01-28 ENCOUNTER — Inpatient Hospital Stay (HOSPITAL_COMMUNITY)
Admission: AD | Admit: 2013-01-28 | Discharge: 2013-01-28 | Disposition: A | Discharge status: Home / Self Care | Payer: Medicaid Other | Source: Ambulatory Visit | Attending: Obstetrics and Gynecology | Admitting: Obstetrics and Gynecology

## 2013-01-28 DIAGNOSIS — L02219 Cutaneous abscess of trunk, unspecified: Secondary | ICD-10-CM | POA: Insufficient documentation

## 2013-01-28 DIAGNOSIS — L03319 Cellulitis of trunk, unspecified: Secondary | ICD-10-CM | POA: Insufficient documentation

## 2013-01-28 DIAGNOSIS — L0291 Cutaneous abscess, unspecified: Secondary | ICD-10-CM

## 2013-01-28 MED ORDER — OXYCODONE-ACETAMINOPHEN 5-325 MG PO TABS: 1.0000 | ORAL_TABLET | ORAL | Status: DC | PRN

## 2013-01-28 MED ORDER — IBUPROFEN 600 MG PO TABS: 600.0000 mg | ORAL_TABLET | Freq: Four times a day (QID) | ORAL | Status: DC

## 2013-01-28 MED ORDER — KETOROLAC TROMETHAMINE 60 MG/2ML IM SOLN
60.0000 mg | Freq: Once | INTRAMUSCULAR | Status: AC
  Administered 2013-01-28: 60 mg via INTRAMUSCULAR
  Filled 2013-01-28: qty 2

## 2013-01-28 MED ORDER — SULFAMETHOXAZOLE-TRIMETHOPRIM 800-160 MG PO TABS: 1.0000 | ORAL_TABLET | Freq: Two times a day (BID) | ORAL | Status: AC

## 2013-01-28 NOTE — MAU Provider Note (Signed)
History     CSN: [id number]  Arrival date and time: 01/28/13 1526   First Provider Initiated Contact with Patient 01/28/13 1842      Chief Complaint  Patient presents with  . Recurrent Skin Infections   HPI [name] is a 22 y.o. [id number] who presents to MAU today with complaint of an abscess on her right flank. The patient states that about 1 month ago she had a similar abscess in the same location that she was given antibiotics for and it drained spontaneously. She has had 3 episodes since 07/2012. She started to noticed the pain for this episode of Saturday night. She has been using a heating pad on the area to promote drainage, but there hasn't been any yet. She feel that it is getting bigger and more painful recently. She denies fever. She rates her pain at 8/10 now. Last dose of Ibuprofen was taken at 9 am.   OB History   Grav Para Term Preterm Abortions TAB SAB Ect Mult Living   3 2 2  0 1 0 1 0 0 2      Past Medical History  Diagnosis Date  . Hypertension   . MRSA (methicillin resistant Staphylococcus aureus) colonization     chronic intermittent problem with boils-occassionally test positive for MRSA. None present at PAT appt.  . Boil     on side  . Pregnancy induced hypertension   . PONV (postoperative nausea and vomiting) 2013    pt. states she had a lot of nausea and vomiting after her last c/s  here at [facility]    Past Surgical History  Procedure Laterality Date  . Eye surgery    . Cesarean section    . Cesarean section  06/22/2011    Procedure: CESAREAN SECTION;  Surgeon: Hal Morales, MD;  Location: [facility];  Service: Gynecology;  Laterality: N/A;  MRSA positive, Latex Allergy  . Cesarean section N/A 12/11/2012    Procedure: REPEAT CESAREAN SECTION;  Surgeon: Adam Phenix, MD;  Location: [facility];  Service: Obstetrics;  Laterality: N/A;    Family History  Problem Relation Age of Onset  . Diabetes Mother   . Diabetes Maternal Aunt   .  Diabetes Father   . Heart disease Father   . Cancer Maternal Grandmother     History  Substance Use Topics  . Smoking status: Never Smoker   . Smokeless tobacco: Never Used  . Alcohol Use: No    Allergies:  Allergies  Allergen Reactions  . Latex Hives and Itching  . Pork-Derived Products Other (See Comments)    Religous reasons does not eat    Prescriptions prior to admission  Medication Sig Dispense Refill  . ibuprofen (ADVIL,MOTRIN) 600 MG tablet Take 1 tablet (600 mg total) by mouth every 6 (six) hours.  30 tablet  1  . labetalol (NORMODYNE) 200 MG tablet Take 1 tablet (200 mg total) by mouth 2 (two) times daily.  60 tablet  3  . labetalol (NORMODYNE) 200 MG tablet TAKE 2 TABLETS BY MOUTH TWICE DAILY  360 tablet  3  . oxyCODONE-acetaminophen (PERCOCET/ROXICET) 5-325 MG per tablet Take 1-2 tablets by mouth every 4 (four) hours as needed.  40 tablet  0  . Prenatal Vit-Fe Fumarate-FA (PRENATAL MULTIVITAMIN) TABS Take 1 tablet by mouth daily at 12 noon.        Review of Systems  Constitutional: Negative for fever and malaise/fatigue.  Gastrointestinal: Negative for abdominal pain.  Genitourinary:  Negative for dysuria, urgency and frequency.       + boil Neg - vaginal discharge, bleeding   Physical Exam   Blood pressure 155/94, pulse 92, temperature 98.8 F (37.1 C), temperature source Oral, resp. rate 20, last menstrual period 01/11/2013, not currently breastfeeding.  Physical Exam  Constitutional: She is oriented to person, place, and time.  Well-developed morbidly obese female in no acute distress  HENT:  Head: Normocephalic and atraumatic.  Cardiovascular: Normal rate, regular rhythm and normal heart sounds.   Respiratory: Effort normal and breath sounds normal. No respiratory distress.  GI: Soft. Bowel sounds are normal.  ~ 3 cm area of firm edema noted with some mild surrounding erythema on the right mid-flank. No drainage noted. No fluctuance.   Neurological:  She is alert and oriented to person, place, and time.  Skin: Skin is warm and dry. No erythema.  Psychiatric: She has a normal mood and affect.   MAU Course  Procedures None  MDM Area of concern is not appropriate for drainage. Patient will be given antibiotics and pain medications. Follow-up with PCP or [facility] or [facility] if symptoms persist or worsen Area of concern has the appearance of scar tissue Assessment and Plan  A: Skin Abscess  P: Discharge home Rx for Bactrim, Percocet and Ibuprofen given/sent to patient's pharmacy Patient advised of warning signs for infection including worsening of swelling, surrounding redness and fever Patient advised that further evaluation would be best handled at [facility], [facility] or her PCP of choice Patient may return to MAU as needed or if her condition were to change or worsen  Freddi Starr, PA-C  01/28/2013, 6:42 PM

## 2013-01-28 NOTE — MAU Note (Signed)
Pt states she has had this type of abscess before. Pt states she noticed this abscess 01/25/2013. Pt states she started taking Ibuprofen and tylenol for the pain.

## 2013-01-28 NOTE — MAU Note (Signed)
Boil on rt lower side.  First noted on Sat night.  Hx of same, has been cultured, hx of MRSA- most recent was neg.

## 2013-02-02 NOTE — MAU Provider Note (Signed)
Attestation of Attending Supervision of Advanced Practitioner (CNM/NP): Evaluation and management procedures were performed by the Advanced Practitioner under my supervision and collaboration. I have reviewed the Advanced Practitioner's note and chart, and I agree with the management and plan.  Jj Enyeart H. 4:22 PM   

## 2013-02-14 ENCOUNTER — Encounter: Payer: Self-pay | Admitting: Obstetrics & Gynecology

## 2013-02-14 ENCOUNTER — Ambulatory Visit (INDEPENDENT_AMBULATORY_CARE_PROVIDER_SITE_OTHER): Payer: Medicaid Other | Admitting: Obstetrics & Gynecology

## 2013-02-14 ENCOUNTER — Ambulatory Visit: Payer: Medicaid Other | Admitting: Obstetrics & Gynecology

## 2013-02-14 VITALS — BP 157/86 | HR 86 | Temp 97.2°F | Ht 64.0 in | Wt 385.3 lb

## 2013-02-14 DIAGNOSIS — Z30431 Encounter for routine checking of intrauterine contraceptive device: Secondary | ICD-10-CM

## 2013-02-14 NOTE — Progress Notes (Signed)
Patient ID: Krystal Hartman, female   DOB: 05-14-1991, 22 y.o.   MRN: [id number] History:  22 y.o. [id number] here today for today for IUD string check; Mirena IUD was placed  1 month prev. No complaints about the Mirena, no concerning side effects.  The following portions of the patient's history were reviewed and updated as appropriate: allergies, current medications, past family history, past medical history, past social history, past surgical history and problem list. Last pap smear on 07/2012 Review of Systems:  Pertinent items are noted in HPI.  Objective:  Physical Exam Blood pressure 157/86, pulse 86, temperature 97.2 F (36.2 C), temperature source Oral, height 5\' 4"  (1.626 m), weight 385 lb 4.8 oz (174.771 kg), last menstrual period 01/26/2013, unknown if currently breastfeeding. Gen: NAD Abd: Soft, nontender and nondistended; morbidly obese Pelvic: Normal appearing external genitalia; normal appearing vaginal mucosa and cervix.  IUD strings visualized, about 3 cm in length outside cervix.   Assessment & Plan:  Normal IUD check. Patient to keep IUD in place for five years; can come in for removal if she desires pregnancy within the next five years. Routine preventative health maintenance measures emphasized. F/u 1 year or sooner prn

## 2013-02-14 NOTE — Patient Instructions (Signed)

## 2013-04-09 ENCOUNTER — Encounter: Payer: Self-pay | Admitting: *Deleted

## 2013-04-24 ENCOUNTER — Other Ambulatory Visit: Payer: Self-pay

## 2014-04-20 ENCOUNTER — Encounter: Payer: Self-pay | Admitting: Obstetrics & Gynecology

## 2014-06-27 ENCOUNTER — Inpatient Hospital Stay (HOSPITAL_COMMUNITY)
Admission: AD | Admit: 2014-06-27 | Discharge: 2014-06-27 | Disposition: A | Discharge status: Home / Self Care | Payer: Medicaid Other | Source: Ambulatory Visit | Attending: Family Medicine | Admitting: Family Medicine

## 2014-06-27 ENCOUNTER — Encounter (HOSPITAL_COMMUNITY): Payer: Self-pay | Admitting: *Deleted

## 2014-06-27 DIAGNOSIS — B3731 Acute candidiasis of vulva and vagina: Secondary | ICD-10-CM

## 2014-06-27 DIAGNOSIS — B373 Candidiasis of vulva and vagina: Secondary | ICD-10-CM | POA: Insufficient documentation

## 2014-06-27 DIAGNOSIS — N390 Urinary tract infection, site not specified: Secondary | ICD-10-CM | POA: Insufficient documentation

## 2014-06-27 LAB — WET PREP, GENITAL
Clue Cells Wet Prep HPF POC: NONE SEEN
TRICH WET PREP: NONE SEEN

## 2014-06-27 LAB — URINALYSIS, ROUTINE W REFLEX MICROSCOPIC
Bilirubin Urine: NEGATIVE
GLUCOSE, UA: NEGATIVE mg/dL
KETONES UR: NEGATIVE mg/dL
NITRITE: NEGATIVE
PH: 6 (ref 5.0–8.0)
PROTEIN: NEGATIVE mg/dL
SPECIFIC GRAVITY, URINE: 1.025 (ref 1.005–1.030)
UROBILINOGEN UA: 0.2 mg/dL (ref 0.0–1.0)

## 2014-06-27 LAB — URINE MICROSCOPIC-ADD ON

## 2014-06-27 LAB — POCT PREGNANCY, URINE: PREG TEST UR: NEGATIVE

## 2014-06-27 MED ORDER — CEPHALEXIN 500 MG PO CAPS: 500.0000 mg | ORAL_CAPSULE | Freq: Four times a day (QID) | ORAL | Status: DC

## 2014-06-27 MED ORDER — FLUCONAZOLE 150 MG PO TABS: 150.0000 mg | ORAL_TABLET | Freq: Every day | ORAL | Status: DC

## 2014-06-27 NOTE — MAU Provider Note (Signed)
History     CSN: [id number]  Arrival date and time: 06/27/14 4098   First Provider Initiated Contact with Patient 06/27/14 1100      Chief Complaint  Patient presents with  . Urinary Tract Infection   HPI Comments: Krystal Hartman 24 y.o. [id number] presents to MAU with sx of UTI since Monday. She denies any partner change and has Mirena IUD for birth control.   Urinary Tract Infection  Associated symptoms include frequency.      Past Medical History  Diagnosis Date  . Hypertension   . MRSA (methicillin resistant Staphylococcus aureus) colonization     chronic intermittent problem with boils-occassionally test positive for MRSA. None present at PAT appt.  . Boil     on side  . Pregnancy induced hypertension   . PONV (postoperative nausea and vomiting) 2013    pt. states she had a lot of nausea and vomiting after her last c/s  here at [facility]    Past Surgical History  Procedure Laterality Date  . Eye surgery    . Cesarean section    . Cesarean section  06/22/2011    Procedure: CESAREAN SECTION;  Surgeon: Hal Morales, MD;  Location: [facility];  Service: Gynecology;  Laterality: N/A;  MRSA positive, Latex Allergy  . Cesarean section N/A 12/11/2012    Procedure: REPEAT CESAREAN SECTION;  Surgeon: Adam Phenix, MD;  Location: [facility];  Service: Obstetrics;  Laterality: N/A;    Family History  Problem Relation Age of Onset  . Diabetes Mother   . Diabetes Maternal Aunt   . Diabetes Father   . Heart disease Father   . Cancer Maternal Grandmother     History  Substance Use Topics  . Smoking status: Never Smoker   . Smokeless tobacco: Never Used  . Alcohol Use: No    Allergies:  Allergies  Allergen Reactions  . Latex Hives and Itching  . Pork-Derived Products Other (See Comments)    Does not eat due to religious reasons.    Prescriptions prior to admission  Medication Sig Dispense Refill Last Dose  . ampicillin (PRINCIPEN) 250 MG capsule Take 250 mg  by mouth 2 (two) times daily.   06/26/2014 at Unknown time  . ibuprofen (ADVIL,MOTRIN) 200 MG tablet Take 200 mg by mouth every 6 (six) hours as needed for mild pain.   06/26/2014 at 1700  . ibuprofen (ADVIL,MOTRIN) 600 MG tablet Take 1 tablet (600 mg total) by mouth every 6 (six) hours. (Patient not taking: Reported on 06/27/2014) 30 tablet 0 Taking  . labetalol (NORMODYNE) 200 MG tablet TAKE 2 TABLETS BY MOUTH TWICE DAILY (Patient not taking: Reported on 06/27/2014) 360 tablet 3 Taking  . oxyCODONE-acetaminophen (PERCOCET/ROXICET) 5-325 MG per tablet Take 1-2 tablets by mouth every 4 (four) hours as needed. (Patient not taking: Reported on 06/27/2014) 12 tablet 0 Not Taking    Review of Systems  Constitutional: Negative.   HENT: Negative.   Eyes: Negative.   Cardiovascular: Negative.   Gastrointestinal: Negative.   Genitourinary: Positive for frequency.       Sense of pressure Vaginal bleeding  Neurological: Negative.   Psychiatric/Behavioral: Negative.    Physical Exam   Blood pressure 159/91, pulse 97, temperature 97.3 F (36.3 C), temperature source Oral, resp. rate 18, unknown if currently breastfeeding.  Physical Exam  Constitutional: She is oriented to person, place, and time. She appears well-developed and well-nourished. No distress.  Morbid Obesity  HENT:  Head: Normocephalic and atraumatic.  GI: Soft. She exhibits no distension. There is no tenderness. There is no rebound and no guarding.  Genitourinary:  Genital: external/ odor Vaginal: yellow odorous discharge Cervix: closed/ thick Bimanual:nontender   Musculoskeletal: Normal range of motion.  Neurological: She is alert and oriented to person, place, and time.  Skin: Skin is warm and dry.  Psychiatric: She has a normal mood and affect. Her behavior is normal. Judgment and thought content normal.   Results for orders placed or performed during the hospital encounter of 06/27/14 (from the past 24 hour(s))  Urinalysis,  Routine w reflex microscopic     Status: Abnormal   Collection Time: 06/27/14 10:25 AM  Result Value Ref Range   Color, Urine YELLOW YELLOW   APPearance HAZY (A) CLEAR   Specific Gravity, Urine 1.025 1.005 - 1.030   pH 6.0 5.0 - 8.0   Glucose, UA NEGATIVE NEGATIVE mg/dL   Hgb urine dipstick TRACE (A) NEGATIVE   Bilirubin Urine NEGATIVE NEGATIVE   Ketones, ur NEGATIVE NEGATIVE mg/dL   Protein, ur NEGATIVE NEGATIVE mg/dL   Urobilinogen, UA 0.2 0.0 - 1.0 mg/dL   Nitrite NEGATIVE NEGATIVE   Leukocytes, UA MODERATE (A) NEGATIVE  Urine microscopic-add on     Status: Abnormal   Collection Time: 06/27/14 10:25 AM  Result Value Ref Range   Squamous Epithelial / LPF RARE RARE   WBC, UA 21-50 <3 WBC/hpf   RBC / HPF 0-2 <3 RBC/hpf   Bacteria, UA FEW (A) RARE   Urine-Other MUCOUS PRESENT   Pregnancy, urine POC     Status: None   Collection Time: 06/27/14 10:42 AM  Result Value Ref Range   Preg Test, Ur NEGATIVE NEGATIVE  Wet prep, genital     Status: Abnormal   Collection Time: 06/27/14 11:10 AM  Result Value Ref Range   Yeast Wet Prep HPF POC FEW (A) NONE SEEN   Trich, Wet Prep NONE SEEN NONE SEEN   Clue Cells Wet Prep HPF POC NONE SEEN NONE SEEN   WBC, Wet Prep HPF POC MODERATE (A) NONE SEEN     MAU Course  Procedures  MDM Wet prep, GC, Chlamydia, HIV, urine culture  Assessment and Plan   A: UTI Vaginal yeast  P: Keflex 500 mg PO QID x 7 days Diflucan 150 mg # 2 tabs Increase fluids/ rest/  Return to MAU as needed   Carolynn ServeBarefoot, Krystal Hartman 06/27/2014, 11:40 AM

## 2014-06-27 NOTE — MAU Note (Signed)
Pt states she has a lot of pressure when trying to urinate, no itching or burning.  Some vaginal bleeding every time she goes to the bathroom.

## 2014-06-27 NOTE — Discharge Instructions (Signed)
Candidal Vulvovaginitis °Candidal vulvovaginitis is an infection of the vagina and vulva. The vulva is the skin around the opening of the vagina. This may cause itching and discomfort in and around the vagina.  °HOME CARE °· Only take medicine as told by your doctor. °· Do not have sex (intercourse) until the infection is healed or as told by your doctor. °· Practice safe sex. °· Tell your sex partner about your infection. °· Do not douche or use tampons. °· Wear cotton underwear. Do not wear tight pants or panty hose. °· Eat yogurt. This may help treat and prevent yeast infections. °GET HELP RIGHT AWAY IF:  °· You have a fever. °· Your problems get worse during treatment or do not get better in 3 days. °· You have discomfort, irritation, or itching in your vagina or vulva area. °· You have pain after sex. °· You start to get belly (abdominal) pain. °MAKE SURE YOU: °· Understand these instructions. °· Will watch your condition. °· Will get help right away if you are not doing well or get worse. °Document Released: 09/01/2008 Document Revised: 06/10/2013 Document Reviewed: 09/01/2008 °ExitCare® Patient Information ©2015 [employer]. This information is not intended to replace advice given to you by your health care provider. Make sure you discuss any questions you have with your health care provider. ° °Urinary Tract Infection °Urinary tract infections (UTIs) can develop anywhere along your urinary tract. Your urinary tract is your body's drainage system for removing wastes and extra water. Your urinary tract includes two kidneys, two ureters, a bladder, and a urethra. Your kidneys are a pair of bean-shaped organs. Each kidney is about the size of your fist. They are located below your ribs, one on each side of your spine. °CAUSES °Infections are caused by microbes, which are microscopic organisms, including fungi, viruses, and bacteria. These organisms are so small that they can only be seen through a microscope.  Bacteria are the microbes that most commonly cause UTIs. °SYMPTOMS  °Symptoms of UTIs may vary by age and gender of the patient and by the location of the infection. Symptoms in young women typically include a frequent and intense urge to urinate and a painful, burning feeling in the bladder or urethra during urination. Older women and men are more likely to be tired, shaky, and weak and have muscle aches and abdominal pain. A fever may mean the infection is in your kidneys. Other symptoms of a kidney infection include pain in your back or sides below the ribs, nausea, and vomiting. °DIAGNOSIS °To diagnose a UTI, your caregiver will ask you about your symptoms. Your caregiver also will ask to provide a urine sample. The urine sample will be tested for bacteria and white blood cells. White blood cells are made by your body to help fight infection. °TREATMENT  °Typically, UTIs can be treated with medication. Because most UTIs are caused by a bacterial infection, they usually can be treated with the use of antibiotics. The choice of antibiotic and length of treatment depend on your symptoms and the type of bacteria causing your infection. °HOME CARE INSTRUCTIONS °· If you were prescribed antibiotics, take them exactly as your caregiver instructs you. Finish the medication even if you feel better after you have only taken some of the medication. °· Drink enough water and fluids to keep your urine clear or pale yellow. °· Avoid caffeine, tea, and carbonated beverages. They tend to irritate your bladder. °· Empty your bladder often. Avoid holding urine for   long periods of time. °· Empty your bladder before and after sexual intercourse. °· After a bowel movement, women should cleanse from front to back. Use each tissue only once. °SEEK MEDICAL CARE IF:  °· You have back pain. °· You develop a fever. °· Your symptoms do not begin to resolve within 3 days. °SEEK IMMEDIATE MEDICAL CARE IF:  °· You have severe back pain or  lower abdominal pain. °· You develop chills. °· You have nausea or vomiting. °· You have continued burning or discomfort with urination. °MAKE SURE YOU:  °· Understand these instructions. °· Will watch your condition. °· Will get help right away if you are not doing well or get worse. °Document Released: 03/15/2005 Document Revised: 12/05/2011 Document Reviewed: 07/14/2011 °ExitCare® Patient Information ©2015 [employer]. This information is not intended to replace advice given to you by your health care provider. Make sure you discuss any questions you have with your health care provider. ° °

## 2014-06-28 LAB — HIV ANTIBODY (ROUTINE TESTING W REFLEX)
HIV 1/HIV 2 AB: NONREACTIVE
HIV 1/O/2 Abs-Index Value: <1 (ref <1.00)

## 2014-06-30 LAB — URINE CULTURE

## 2014-06-30 LAB — GC/CHLAMYDIA PROBE AMP
CT PROBE, AMP APTIMA: NEGATIVE
GC Probe RNA: NEGATIVE

## 2015-01-20 ENCOUNTER — Ambulatory Visit: Payer: Medicaid Other | Admitting: Obstetrics & Gynecology

## 2016-06-27 ENCOUNTER — Ambulatory Visit
Admission: RE | Admit: 2016-06-27 | Discharge: 2016-06-27 | Disposition: A | Discharge status: Home / Self Care | Payer: BLUE CROSS/BLUE SHIELD | Source: Ambulatory Visit | Attending: Family Medicine | Admitting: Family Medicine

## 2016-06-27 ENCOUNTER — Other Ambulatory Visit: Payer: Self-pay | Admitting: Family Medicine

## 2016-06-27 DIAGNOSIS — M25571 Pain in right ankle and joints of right foot: Secondary | ICD-10-CM

## 2016-06-27 DIAGNOSIS — M25512 Pain in left shoulder: Secondary | ICD-10-CM

## 2017-04-17 ENCOUNTER — Emergency Department (HOSPITAL_COMMUNITY)
Admission: EM | Admit: 2017-04-17 | Discharge: 2017-04-17 | Disposition: A | Discharge status: Home / Self Care | Payer: BLUE CROSS/BLUE SHIELD | Attending: Emergency Medicine | Admitting: Emergency Medicine

## 2017-04-17 ENCOUNTER — Encounter (HOSPITAL_COMMUNITY): Payer: Self-pay

## 2017-04-17 DIAGNOSIS — I10 Essential (primary) hypertension: Secondary | ICD-10-CM | POA: Insufficient documentation

## 2017-04-17 DIAGNOSIS — Z91040 Latex allergy status: Secondary | ICD-10-CM | POA: Insufficient documentation

## 2017-04-17 DIAGNOSIS — M79601 Pain in right arm: Secondary | ICD-10-CM | POA: Diagnosis present

## 2017-04-17 DIAGNOSIS — M7701 Medial epicondylitis, right elbow: Secondary | ICD-10-CM | POA: Insufficient documentation

## 2017-04-17 MED ORDER — NAPROXEN 500 MG PO TABS: 500.0000 mg | ORAL_TABLET | Freq: Two times a day (BID) | ORAL | 0 refills | Status: DC

## 2017-04-17 NOTE — ED Triage Notes (Signed)
Onset yesterday am right arm pain at arm crease and elbow, slight numbness/tingling in hand.  Hurts to straighten arm out. NO known injuries.

## 2017-04-17 NOTE — ED Notes (Signed)
PT states understanding of care given, follow up care, and medication prescribed. PT ambulated from ED to car with a steady gait. 

## 2017-04-17 NOTE — Discharge Instructions (Signed)
Please read and follow all provided instructions.  Your diagnoses today include:  1. Medial epicondylitis of right elbow     Tests performed today include: Vital signs. See below for your results today.   Medications prescribed:  Take as prescribed   Home care instructions:  Follow any educational materials contained in this packet.  Follow-up instructions: Please follow-up with Orthopedics for further evaluation of symptoms and treatment   Return instructions:  Please return to the Emergency Department if you do not get better, if you get worse, or new symptoms OR  - Fever (temperature greater than 101.52F)  - Bleeding that does not stop with holding pressure to the area    -Severe pain (please note that you may be more sore the day after your accident)  - Chest Pain  - Difficulty breathing  - Severe nausea or vomiting  - Inability to tolerate food and liquids  - Passing out  - Skin becoming red around your wounds  - Change in mental status (confusion or lethargy)  - New numbness or weakness    Please return if you have any other emergent concerns.  Additional Information:  Your vital signs today were: BP (!) 168/97    Pulse 96    Temp 98.2 F (36.8 C) (Oral)    Resp 18    LMP 03/19/2017    SpO2 100%  If your blood pressure (BP) was elevated above 135/85 this visit, please have this repeated by your doctor within one month. ---------------

## 2017-04-17 NOTE — ED Notes (Signed)
Patient is A&Ox4.  No signs of distress noted.  Please see providers complete history and physical exam.  

## 2017-04-17 NOTE — ED Provider Notes (Signed)
[facility] EMERGENCY DEPARTMENT Provider Note   CSN: [id number] Arrival date & time: 04/17/17  2238     History   Chief Complaint Chief Complaint  Patient presents with  . Arm Pain    HPI Krystal Hartman is a 26 y.o. female.  HPI  26 y.o. female with a hx of HTN, presents to the Emergency Department today due to right arm pain. Occurred yesterday. Notes pain with elbow and slight numbness to hand that resolved. Notes pain mainly in inside of elbow. Pain worse with movement. Pt works at [employer] and uses repetitive motions. Minimal pain at rest. Rates 3/10 currently. Throbbing. No numbness/tingling currently. No meds PTA. No other symptoms noted.    Past Medical History:  Diagnosis Date  . Boil    on side  . Hypertension   . 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 None present at PAT appt.  Marland Kitchen PONV (postoperative nausea and vomiting) 2013   pt. states she had a lot of nausea and vomiting after her last c/s  here at [facility]  . Pregnancy induced hypertension     Patient Active Problem List   Diagnosis Date Noted  . S/P repeat cesarean section 12/16/2012  . Tooth pain 11/04/2012  . Pain in joint, ankle and foot 10/28/2012  . LGA (large for gestational age) fetus affecting management of mother 10/14/2012  . Abnormal  one hour glucose tolerance test in pregnancy, antepartum 10/01/2012  . Anemia of mother in pregnancy, antepartum 10/01/2012  . Previous cesarean delivery, antepartum condition or complication 08/19/2012  . High-risk pregnancy supervision 07/22/2012  . Cesarean section wound complication 06/25/2011  . Benign essential hypertension antepartum 06/10/2011  . Anemia 05/26/2011  . Morbid obesity (HCC) 05/25/2011  . Hidradenitis suppurativa 05/25/2011  . 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 CESAREAN SECTION    . CESAREAN SECTION  06/22/2011   Procedure: CESAREAN SECTION;  Surgeon: Hal Morales, MD;  Location: [facility];  Service: Gynecology;  Laterality: N/A;  MRSA positive, Latex Allergy  . CESAREAN SECTION N/A 12/11/2012   Procedure: REPEAT CESAREAN SECTION;  Surgeon: Adam Phenix, MD;  Location: [facility];  Service: Obstetrics;  Laterality: N/A;  . EYE SURGERY      OB History    Gravida Para Term Preterm AB Living   4 2 2  0 1 3   SAB TAB Ectopic Multiple Live Births   1 0 0 0 2       Home Medications    Prior to Admission medications   Medication Sig Start Date End Date Taking? Authorizing Provider  cephALEXin (KEFLEX) 500 MG capsule Take 1 capsule (500 mg total) by mouth 4 (four) times daily. 06/27/14   Delbert Phenix, NP  fluconazole (DIFLUCAN) 150 MG tablet Take 1 tablet (150 mg total) by mouth daily. Take second dose after finishing antibiotic 06/27/14   Barefoot, Doralee Albino, NP    Family History Family History  Problem Relation Age of Onset  . Diabetes Mother   . Diabetes Maternal Aunt   . Diabetes Father   . Heart disease Father   . Cancer Maternal Grandmother     Social History Social History  Substance Use Topics  . Smoking status: Never Smoker  . Smokeless tobacco: Never Used  . Alcohol use No     Allergies  Latex and Pork-derived products   Review of Systems Review of Systems ROS reviewed and all are negative for acute change except as noted in the HPI.  Physical Exam Updated Vital Signs BP (!) 168/97   Pulse 96   Temp 98.2 F (36.8 C) (Oral)   Resp 18   LMP 03/19/2017   SpO2 100%   Physical Exam  Constitutional: She is oriented to person, place, and time. Vital signs are normal. She appears well-developed and well-nourished.  HENT:  Head: Normocephalic and atraumatic.  Right Ear: Hearing normal.  Left Ear: Hearing normal.  Eyes: Pupils are equal, round, and reactive to light.  Conjunctivae and EOM are normal.  Neck: Normal range of motion. Neck supple.  Cardiovascular: Normal rate and regular rhythm.   Pulmonary/Chest: Effort normal.  Musculoskeletal: Normal range of motion.  Right arm TTP along medial epicondyle. No swelling. NVI. Distal pulses appreciated. ROM intact, but painful with extension.   Neurological: She is alert and oriented to person, place, and time.  Skin: Skin is warm and dry.  Psychiatric: She has a normal mood and affect. Her speech is normal and behavior is normal. Thought content normal.  Nursing note and vitals reviewed.    ED Treatments / Results  Labs (all labs ordered are listed, but only abnormal results are displayed) Labs Reviewed - No data to display  EKG  EKG Interpretation None       Radiology No results found.  Procedures Procedures (including critical care time)  Medications Ordered in ED Medications - No data to display   Initial Impression / Assessment and Plan / ED Course  I have reviewed the triage vital signs and the nursing notes.  Pertinent labs & imaging results that were available during my care of the patient were reviewed by me and considered in my medical decision making (see chart for details).  Final Clinical Impressions(s) / ED Diagnoses     {I have reviewed the relevant previous healthcare records.  {I obtained HPI from historian.   ED Course:  Assessment: Pt is a 26 y.o. female with a hx of HTN, presents to the Emergency Department today due to right arm pain. Occurred yesterday. Notes pain with elbow and slight numbness to hand that resolved. Notes pain mainly in inside of elbow. Pain worse with movement. Pt works at [employer] and uses repetitive motions. Minimal pain at rest. Rates 3/10 currently. Throbbing. No numbness/tingling currently. No meds PTA. On exam, pt in NAD. Nontoxic/nonseptic appearing. VSS. Afebrile. Right arm TTP along medial epicondyle. No swelling. NVI. Distal pulses  appreciated. ROM intact, but painful with extension.  No signs of infection. Symptoms likely related to medial epicondylitis. Given NSAIDs in ED. Plan is to DC home with follow up to Ortho. At time of discharge, Patient is in no acute distress. Vital Signs are stable. Patient is able to ambulate. Patient able to tolerate PO.   Disposition/Plan:  DC Home Additional Verbal discharge instructions given and discussed with patient.  Pt Instructed to f/u with PCP in the next week for evaluation and treatment of symptoms. Return precautions given Pt acknowledges and agrees with plan  Supervising Physician Cardama, Amadeo GarnetPedro Eduardo, *  Final diagnoses:  Medial epicondylitis of right elbow    New Prescriptions New Prescriptions   No medications on file     Audry PiliMohr, Nicolis Boody, Cordelia Poche-C 04/17/17 2333    Nira Connardama, Pedro Eduardo, MD 04/17/17 [phone]

## 2017-06-19 HISTORY — DX: Maternal care for unspecified type scar from previous cesarean delivery: O34.219

## 2017-08-27 ENCOUNTER — Ambulatory Visit (INDEPENDENT_AMBULATORY_CARE_PROVIDER_SITE_OTHER): Payer: Medicaid Other | Admitting: Obstetrics & Gynecology

## 2017-08-27 ENCOUNTER — Encounter: Payer: Self-pay | Admitting: Obstetrics & Gynecology

## 2017-08-27 VITALS — BP 146/91 | HR 98 | Ht 66.0 in | Wt >= 6400 oz

## 2017-08-27 DIAGNOSIS — Z309 Encounter for contraceptive management, unspecified: Secondary | ICD-10-CM | POA: Diagnosis present

## 2017-08-27 DIAGNOSIS — Z30432 Encounter for removal of intrauterine contraceptive device: Secondary | ICD-10-CM

## 2017-08-27 DIAGNOSIS — I10 Essential (primary) hypertension: Secondary | ICD-10-CM

## 2017-08-27 MED ORDER — AMLODIPINE BESYLATE 10 MG PO TABS: 10.0000 mg | ORAL_TABLET | Freq: Every day | ORAL | 2 refills | Status: DC

## 2017-08-27 MED ORDER — DOXYCYCLINE HYCLATE 100 MG PO CAPS: 100.0000 mg | ORAL_CAPSULE | Freq: Two times a day (BID) | ORAL | 0 refills | Status: DC

## 2017-08-27 MED ORDER — IBUPROFEN 600 MG PO TABS: 600.0000 mg | ORAL_TABLET | Freq: Four times a day (QID) | ORAL | 0 refills | Status: DC | PRN

## 2017-08-27 NOTE — Progress Notes (Signed)
Pt presents with request for LnIUD removal. She has had her IUD for >5 years. She is undecided on the method of contraception that she wants now and reports that she will not be sexually active for a while so has time to think about it.  Patient was in the dorsal lithotomy position, normal external genitalia was noted.  A speculum was placed in the patient's vagina, normal discharge was noted, no lesions. The multiparous cervix was visualized, no lesions, no abnormal discharge;  and the cervix was swabbed with Betadine using scopettes. The strings of the IUD were grasped and pulled using ring forceps.  The IUD would not coe out and eventually the strings broke off. A paracervical nerve block was placed with 10cc of 1% lidocaine and an IUD extractor was used still without success.  The patient reported excessive pain with the IUD extractor so the procedure was stopped.    Patient desires surgical management with hysteroscopic IUD removal.  The risks of surgery were discussed in detail with the patient including but not limited to: bleeding which may require transfusion or reoperation; infection which may require prolonged hospitalization or re-hospitalization and antibiotic therapy; injury to bowel, bladder, ureters and major vessels or other surrounding organs; need for additional procedures including laparotomy; thromboembolic phenomenon, incisional problems and other postoperative or anesthesia complications.  Patient was told that the likelihood that her condition and symptoms will be treated effectively with this surgical management was very high; the postoperative expectations were also discussed in detail. The patient also understands the alternative treatment options which were discussed in full. All questions were answered.    Wm Sahagun L. Harraway-Smith, M.D., Evern CoreFACOG

## 2017-08-28 ENCOUNTER — Other Ambulatory Visit: Payer: Self-pay

## 2017-08-28 ENCOUNTER — Encounter (HOSPITAL_COMMUNITY): Payer: Self-pay | Admitting: *Deleted

## 2017-08-29 ENCOUNTER — Encounter (HOSPITAL_COMMUNITY)
Admission: RE | Disposition: A | Discharge status: Home / Self Care | Payer: Self-pay | Source: Ambulatory Visit | Attending: Obstetrics & Gynecology

## 2017-08-29 ENCOUNTER — Ambulatory Visit (HOSPITAL_COMMUNITY): Payer: Medicaid Other | Admitting: Anesthesiology

## 2017-08-29 ENCOUNTER — Ambulatory Visit (HOSPITAL_COMMUNITY)
Admission: RE | Admit: 2017-08-29 | Discharge: 2017-08-29 | Disposition: A | Discharge status: Home / Self Care | Payer: Medicaid Other | Source: Ambulatory Visit | Attending: Obstetrics & Gynecology | Admitting: Obstetrics & Gynecology

## 2017-08-29 ENCOUNTER — Encounter (HOSPITAL_COMMUNITY): Payer: Self-pay

## 2017-08-29 ENCOUNTER — Other Ambulatory Visit: Payer: Self-pay | Admitting: Obstetrics & Gynecology

## 2017-08-29 ENCOUNTER — Other Ambulatory Visit: Payer: Self-pay

## 2017-08-29 DIAGNOSIS — Z6841 Body Mass Index (BMI) 40.0 and over, adult: Secondary | ICD-10-CM | POA: Diagnosis not present

## 2017-08-29 DIAGNOSIS — I10 Essential (primary) hypertension: Secondary | ICD-10-CM | POA: Insufficient documentation

## 2017-08-29 DIAGNOSIS — Y838 Other surgical procedures as the cause of abnormal reaction of the patient, or of later complication, without mention of misadventure at the time of the procedure: Secondary | ICD-10-CM | POA: Diagnosis not present

## 2017-08-29 DIAGNOSIS — T8389XD Other specified complication of genitourinary prosthetic devices, implants and grafts, subsequent encounter: Secondary | ICD-10-CM

## 2017-08-29 DIAGNOSIS — T8389XA Other specified complication of genitourinary prosthetic devices, implants and grafts, initial encounter: Secondary | ICD-10-CM | POA: Insufficient documentation

## 2017-08-29 DIAGNOSIS — T839XXA Unspecified complication of genitourinary prosthetic device, implant and graft, initial encounter: Secondary | ICD-10-CM | POA: Diagnosis present

## 2017-08-29 DIAGNOSIS — Z8614 Personal history of Methicillin resistant Staphylococcus aureus infection: Secondary | ICD-10-CM | POA: Diagnosis not present

## 2017-08-29 HISTORY — PX: IUD REMOVAL: SHX5392

## 2017-08-29 HISTORY — DX: Anemia, unspecified: D64.9

## 2017-08-29 HISTORY — DX: Complication of the puerperium, unspecified: O90.9

## 2017-08-29 LAB — CBC
HCT: 33 % — ABNORMAL LOW (ref 36.0–46.0)
HEMOGLOBIN: 10.5 g/dL — AB (ref 12.0–15.0)
MCH: 24.9 pg — AB (ref 26.0–34.0)
MCHC: 31.8 g/dL (ref 30.0–36.0)
MCV: 78.2 fL (ref 78.0–100.0)
Platelets: 331 10*3/uL (ref 150–400)
RBC: 4.22 MIL/uL (ref 3.87–5.11)
RDW: 17.8 % — ABNORMAL HIGH (ref 11.5–15.5)
WBC: 8.3 10*3/uL (ref 4.0–10.5)

## 2017-08-29 LAB — PREGNANCY, URINE: PREG TEST UR: NEGATIVE

## 2017-08-29 SURGERY — REMOVAL, INTRAUTERINE DEVICE
Anesthesia: General | Site: Uterus

## 2017-08-29 MED ORDER — SUCCINYLCHOLINE CHLORIDE 200 MG/10ML IV SOSY
PREFILLED_SYRINGE | INTRAVENOUS | Status: DC | PRN
  Administered 2017-08-29: 180 mg via INTRAVENOUS

## 2017-08-29 MED ORDER — ACETAMINOPHEN 160 MG/5ML PO SOLN
960.0000 mg | Freq: Once | ORAL | Status: AC
Start: 2017-08-29 — End: 2017-08-29

## 2017-08-29 MED ORDER — FENTANYL CITRATE (PF) 250 MCG/5ML IJ SOLN
INTRAMUSCULAR | Status: AC
  Filled 2017-08-29: qty 5

## 2017-08-29 MED ORDER — KETOROLAC TROMETHAMINE 30 MG/ML IJ SOLN
INTRAMUSCULAR | Status: DC | PRN
  Administered 2017-08-29: 30 mg via INTRAVENOUS

## 2017-08-29 MED ORDER — ONDANSETRON HCL 4 MG/2ML IJ SOLN
INTRAMUSCULAR | Status: DC | PRN
  Administered 2017-08-29: 4 mg via INTRAVENOUS

## 2017-08-29 MED ORDER — LIDOCAINE HCL (CARDIAC) 20 MG/ML IV SOLN
INTRAVENOUS | Status: DC | PRN
  Administered 2017-08-29: 80 mg via INTRAVENOUS

## 2017-08-29 MED ORDER — LACTATED RINGERS IV SOLN: INTRAVENOUS | Status: DC

## 2017-08-29 MED ORDER — OXYCODONE HCL 5 MG/5ML PO SOLN: 5.0000 mg | Freq: Once | ORAL | Status: DC | PRN

## 2017-08-29 MED ORDER — HYDROMORPHONE HCL 1 MG/ML IJ SOLN: 0.2500 mg | INTRAMUSCULAR | Status: DC | PRN

## 2017-08-29 MED ORDER — FENTANYL CITRATE (PF) 100 MCG/2ML IJ SOLN
INTRAMUSCULAR | Status: DC | PRN
  Administered 2017-08-29: 50 ug via INTRAVENOUS

## 2017-08-29 MED ORDER — LIDOCAINE HCL (CARDIAC) 20 MG/ML IV SOLN
INTRAVENOUS | Status: AC
Start: 2017-08-29 — End: ?
  Filled 2017-08-29: qty 5

## 2017-08-29 MED ORDER — SUCCINYLCHOLINE CHLORIDE 200 MG/10ML IV SOSY
PREFILLED_SYRINGE | INTRAVENOUS | Status: AC
  Filled 2017-08-29: qty 10

## 2017-08-29 MED ORDER — PROMETHAZINE HCL 25 MG/ML IJ SOLN: 6.2500 mg | INTRAMUSCULAR | Status: DC | PRN

## 2017-08-29 MED ORDER — PROPOFOL 10 MG/ML IV BOLUS
INTRAVENOUS | Status: AC
  Filled 2017-08-29: qty 20

## 2017-08-29 MED ORDER — DEXAMETHASONE SODIUM PHOSPHATE 10 MG/ML IJ SOLN
INTRAMUSCULAR | Status: DC | PRN
  Administered 2017-08-29: 10 mg via INTRAVENOUS

## 2017-08-29 MED ORDER — IBUPROFEN 600 MG PO TABS: 600.0000 mg | ORAL_TABLET | Freq: Four times a day (QID) | ORAL | 2 refills | Status: DC | PRN

## 2017-08-29 MED ORDER — MIDAZOLAM HCL 2 MG/2ML IJ SOLN
INTRAMUSCULAR | Status: DC | PRN
  Administered 2017-08-29: 1 mg via INTRAVENOUS

## 2017-08-29 MED ORDER — ACETAMINOPHEN 500 MG PO TABS
1000.0000 mg | ORAL_TABLET | Freq: Once | ORAL | Status: AC
Start: 2017-08-29 — End: 2017-08-29
  Administered 2017-08-29: 1000 mg via ORAL

## 2017-08-29 MED ORDER — PROPOFOL 10 MG/ML IV BOLUS
INTRAVENOUS | Status: DC | PRN
  Administered 2017-08-29: 200 mg via INTRAVENOUS

## 2017-08-29 MED ORDER — FAMOTIDINE 20 MG PO TABS
20.0000 mg | ORAL_TABLET | Freq: Once | ORAL | Status: AC
  Administered 2017-08-29: 20 mg via ORAL

## 2017-08-29 MED ORDER — BUPIVACAINE HCL (PF) 0.5 % IJ SOLN
INTRAMUSCULAR | Status: AC
  Filled 2017-08-29: qty 30

## 2017-08-29 MED ORDER — PHENYLEPHRINE HCL 10 MG/ML IJ SOLN
INTRAMUSCULAR | Status: DC | PRN
  Administered 2017-08-29 (×4): 80 ug via INTRAVENOUS

## 2017-08-29 MED ORDER — KETOROLAC TROMETHAMINE 30 MG/ML IJ SOLN
INTRAMUSCULAR | Status: AC
  Filled 2017-08-29: qty 1

## 2017-08-29 MED ORDER — ACETAMINOPHEN 500 MG PO TABS
ORAL_TABLET | ORAL | Status: AC
  Filled 2017-08-29: qty 2

## 2017-08-29 MED ORDER — MIDAZOLAM HCL 2 MG/2ML IJ SOLN
INTRAMUSCULAR | Status: AC
  Filled 2017-08-29: qty 2

## 2017-08-29 MED ORDER — SCOPOLAMINE 1 MG/3DAYS TD PT72
1.0000 | MEDICATED_PATCH | Freq: Once | TRANSDERMAL | Status: DC
  Administered 2017-08-29: 1.5 mg via TRANSDERMAL

## 2017-08-29 MED ORDER — OXYCODONE HCL 5 MG PO TABS: 5.0000 mg | ORAL_TABLET | Freq: Once | ORAL | Status: DC | PRN

## 2017-08-29 MED ORDER — SCOPOLAMINE 1 MG/3DAYS TD PT72
MEDICATED_PATCH | TRANSDERMAL | Status: AC
  Administered 2017-08-29: 1.5 mg via TRANSDERMAL
  Filled 2017-08-29: qty 1

## 2017-08-29 MED ORDER — ONDANSETRON HCL 4 MG/2ML IJ SOLN
INTRAMUSCULAR | Status: AC
  Filled 2017-08-29: qty 2

## 2017-08-29 MED ORDER — FAMOTIDINE 20 MG PO TABS
ORAL_TABLET | ORAL | Status: AC
  Filled 2017-08-29: qty 1

## 2017-08-29 MED ORDER — SODIUM CHLORIDE 0.9 % IR SOLN
Status: DC | PRN
  Administered 2017-08-29: 3000 mL

## 2017-08-29 MED ORDER — LACTATED RINGERS IV SOLN
INTRAVENOUS | Status: DC
  Administered 2017-08-29: 125 mL/h via INTRAVENOUS

## 2017-08-29 MED ORDER — BUPIVACAINE HCL 0.5 % IJ SOLN
INTRAMUSCULAR | Status: DC | PRN
  Administered 2017-08-29: 30 mL

## 2017-08-29 SURGICAL SUPPLY — 13 items
BIPOLAR CUTTING LOOP 21FR (ELECTRODE)
CANISTER SUCT 3000ML PPV (MISCELLANEOUS) ×3 IMPLANT
CATH ROBINSON RED A/P 16FR (CATHETERS) ×3 IMPLANT
GLOVE BIOGEL PI IND STRL 7.0 (GLOVE) ×1 IMPLANT
GLOVE BIOGEL PI INDICATOR 7.0 (GLOVE) ×2
GLOVE ECLIPSE 7.0 STRL STRAW (GLOVE) ×3 IMPLANT
GOWN STRL REUS W/TWL LRG LVL3 (GOWN DISPOSABLE) ×6 IMPLANT
LOOP CUTTING BIPOLAR 21FR (ELECTRODE) IMPLANT
PACK VAGINAL MINOR WOMEN LF (CUSTOM PROCEDURE TRAY) ×3 IMPLANT
PAD OB MATERNITY 4.3X12.25 (PERSONAL CARE ITEMS) ×3 IMPLANT
TOWEL OR 17X24 6PK STRL BLUE (TOWEL DISPOSABLE) ×6 IMPLANT
TUBING AQUILEX INFLOW (TUBING) ×3 IMPLANT
TUBING AQUILEX OUTFLOW (TUBING) ×3 IMPLANT

## 2017-08-29 NOTE — Transfer of Care (Signed)
Immediate Anesthesia Transfer of Care Note  Patient: Lynnell ChadDabrisha Y Sarkisyan  Procedure(s) Performed: HYSTEROSCOPY WITH INTRAUTERINE DEVICE (IUD) REMOVAL (N/A Uterus)  Patient Location: PACU  Anesthesia Type:General  Level of Consciousness: sedated  Airway & Oxygen Therapy: Patient Spontanous Breathing and Patient connected to nasal cannula oxygen  Post-op Assessment: Report given to RN and Post -op Vital signs reviewed and stable  Post vital signs: Reviewed and stable  Last Vitals:  Vitals:   08/29/17 1243 08/29/17 1434  BP: (!) 164/91 (!) (P) 147/92  Pulse: 79 95  Resp: 20 (P) 20  Temp: 36.6 C (P) 36.9 C  SpO2: 99% (P) 95%    Last Pain:  Vitals:   08/29/17 1243  TempSrc: Oral  PainSc: 0-No pain      Patients Stated Pain Goal: 5 (08/29/17 1243)  Complications: No apparent anesthesia complications

## 2017-08-29 NOTE — H&P (Signed)
Preoperative History and Physical  Krystal Hartman is a 27 y.o. [phone] here for surgical management of possible embedded IUD that was unable to removed in office.   No significant preoperative concerns.  Proposed surgery: Removal of IUD under anesthesia, possible hysteroscopy  Past Medical History:  Diagnosis Date  . Anemia   . Boil    on side  . Cesarean section wound complication 06/25/2011   Consider wound vac   . Hypertension    started on BP med yesterday  . 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 None present at PAT appt.  Marland Kitchen. PONV (postoperative nausea and vomiting) 2013   pt. states she had a lot of nausea and vomiting after her last c/s  [facility]  . Pregnancy induced hypertension   . Previous cesarean delivery, antepartum condition or complication 08/19/2012   For tertiary C/S, no tubal    Past Surgical History:  Procedure Laterality Date  . CESAREAN SECTION    . CESAREAN SECTION  06/22/2011   Procedure: CESAREAN SECTION;  Surgeon: Hal MoralesVanessa P Haygood, MD;  Location: [facility];  Service: Gynecology;  Laterality: N/A;  MRSA positive, Latex Allergy  . CESAREAN SECTION N/A 12/11/2012   Procedure: REPEAT CESAREAN SECTION;  Surgeon: Adam PhenixJames G Arnold, MD;  Location: [facility];  Service: Obstetrics;  Laterality: N/A;  . EYE SURGERY     OB History  Gravida Para Term Preterm AB Living  4 2 2  0 1 3  SAB TAB Ectopic Multiple Live Births  1 0 0 0 2    # Outcome Date GA Lbr Len/2nd Weight Sex Delivery Anes PTL Lv  4 Term 12/11/12 5139w0d  9 lb 5.7 oz (4.245 kg) M CS-LTranv Spinal, EPI  LIV  3 Gravida 2013    F CS-LTranv   LIV  2 Term 11/10/09 2887w0d  7 lb 3 oz (3.26 kg) M CS-LTranv        Birth Comments: facial presentation  1 SAB  597w0d           Patient denies any other pertinent gynecologic issues.   No current facility-administered medications on file prior to encounter.     Current Outpatient Medications on File Prior to Encounter  Medication Sig Dispense Refill  . acetaminophen (TYLENOL) 325 MG tablet Take 650 mg by mouth every 6 (six) hours as needed for mild pain or headache.     Marland Kitchen. amLODipine (NORVASC) 10 MG tablet Take 1 tablet (10 mg total) by mouth daily. 30 tablet 2  . doxycycline (VIBRAMYCIN) 100 MG capsule Take 1 capsule (100 mg total) by mouth 2 (two) times daily. 10 capsule 0  . ibuprofen (ADVIL,MOTRIN) 600 MG tablet Take 1 tablet (600 mg total) by mouth every 6 (six) hours as needed. (Patient taking differently: Take 600 mg by mouth every 6 (six) hours as needed for moderate pain. ) 30 tablet 0   Allergies  Allergen Reactions  . Latex Hives and Itching  . Pork-Derived Products Other (See Comments)    Does not eat due to religious reasons.    Social History:   reports that  has never smoked. she has never used smokeless tobacco. She reports that she does not drink alcohol or use drugs.  Family History  Problem Relation Age of Onset  . Diabetes Mother   . Diabetes Maternal Aunt   . Diabetes Father   . Heart disease Father   . Cancer Maternal  Grandmother     Review of Systems: Pertinent items noted in HPI and remainder of comprehensive ROS otherwise negative.  PHYSICAL EXAM: Blood pressure (!) 164/91, pulse 79, temperature 97.9 F (36.6 C), temperature source Oral, resp. rate 20, height 5\' 6"  (1.676 m), weight (!) 414 lb (187.8 kg), last menstrual period 08/23/2017, SpO2 99 %, unknown if currently breastfeeding. CONSTITUTIONAL: Well-developed, morbidly obese female in no acute distress.  HENT:  Normocephalic, atraumatic, External right and left ear normal. Oropharynx is clear and moist EYES: Conjunctivae and EOM are normal. Pupils are equal, round, and reactive to light. No scleral icterus.  NECK: Normal range of motion, supple, no masses SKIN: Skin is warm and dry. No rash noted. Not diaphoretic. No erythema. No pallor. NEUROLOGIC:  Alert and oriented to person, place, and time. Normal reflexes, muscle tone coordination. No cranial nerve deficit noted. PSYCHIATRIC: Normal mood and affect. Normal behavior. Normal judgment and thought content. CARDIOVASCULAR: Normal heart rate noted, regular rhythm RESPIRATORY: Effort and breath sounds normal, no problems with respiration noted ABDOMEN: Soft, nontender, nondistended. PELVIC: Deferred MUSCULOSKELETAL: Normal range of motion. No edema and no tenderness. 2+ distal pulses.  Labs: Results for orders placed or performed during the hospital encounter of 08/29/17 (from the past 336 hour(s))  Pregnancy, urine   Collection Time: 08/29/17 11:15 AM  Result Value Ref Range   Preg Test, Ur NEGATIVE NEGATIVE  CBC   Collection Time: 08/29/17 12:20 PM  Result Value Ref Range   WBC 8.3 4.0 - 10.5 K/uL   RBC 4.22 3.87 - 5.11 MIL/uL   Hemoglobin 10.5 (L) 12.0 - 15.0 g/dL   HCT 16.1 (L) 09.6 - 04.5 %   MCV 78.2 78.0 - 100.0 fL   MCH 24.9 (L) 26.0 - 34.0 pg   MCHC 31.8 30.0 - 36.0 g/dL   RDW 40.9 (H) 81.1 - 91.4 %   Platelets 331 150 - 400 K/uL    Imaging Studies: No results found.  Assessment: Patient Active Problem List   Diagnosis Date Noted  . IUD complication (HCC) 08/29/2017  . Chronic hypertension 06/10/2011  . Morbid obesity (HCC) 05/25/2011  . Hidradenitis suppurativa 05/25/2011  . 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  The risks of surgery were discussed in detail with the patient including but not limited to: bleeding; infection; injury to uterus or surrounding organs ; need for additional procedures; possibility of not removing IUD intact and possibly leaving IUD arms/fragments embedded in the uterus, surgical site problems and other postoperative/anesthesia complications. Likelihood of success in alleviating the patient's condition was  discussed. Routine postoperative instructions will be reviewed with the patient and her family in detail after surgery.  The patient concurred with the proposed plan, giving informed written consent for the surgery.  Patient has been NPO since last night and she will remain NPO for procedure.  Anesthesia and OR aware.  Preoperative SCDs ordered on call to the OR.  To OR when ready.    Jaynie Collins, MD, FACOG Obstetrician & Gynecologist, [facility] for [employer], [facility]

## 2017-08-29 NOTE — Discharge Instructions (Addendum)
Hysteroscopy, Care After Refer to this sheet in the next few weeks. These instructions provide you with information on caring for yourself after your procedure. Your health care provider may also give you more specific instructions. Your treatment has been planned according to current medical practices, but problems sometimes occur. Call your health care provider if you have any problems or questions after your procedure. What can I expect after the procedure? After your procedure, it is typical to have the following:  You may have some cramping. This normally lasts for a couple days.  You may have bleeding. This can vary from light spotting for a few days to menstrual-like bleeding for 3-7 days.  Follow these instructions at home:  Rest for the first 1-2 days after the procedure.  Only take over-the-counter or prescription medicines as directed by your health care provider.   Take showers instead of baths for 3 days or as directed by your health care provider.  Do not drive for 24 hours or as directed.  Do not drink alcohol while taking pain medicine.  Do not use tampons, douche, or have sexual intercourse for 4 days or until your health care provider says it is okay.  Follow your health care provider's advice about diet, exercise, and lifting.  If you develop constipation, you may: ? Take a mild laxative if your health care provider approves. ? Add bran foods to your diet. ? Drink enough fluids to keep your urine clear or pale yellow.  Try to have someone with you or available to you for the first 24-48 hours, especially if you were given a general anesthetic.  Follow up with your health care provider as directed. Contact a health care provider if:  You feel dizzy or lightheaded.  You feel sick to your stomach (nauseous).  You have abnormal vaginal discharge.  You have a rash.  You have pain that is not controlled with medicine. Get help right away if:  You have  bleeding that is heavier than a normal menstrual period.  You have a fever.  You have increasing cramps or pain, not controlled with medicine.  You have new belly (abdominal) pain.  You pass out.  You have pain in the tops of your shoulders (shoulder strap areas).  You have shortness of breath. This information is not intended to replace advice given to you by your health care provider. Make sure you discuss any questions you have with your health care provider. Document Released: 03/26/2013 Document Revised: 11/11/2015 Document Reviewed: 01/02/2013 Elsevier Interactive Patient Education  2017 [employer]

## 2017-08-29 NOTE — Anesthesia Procedure Notes (Signed)
Procedure Name: Intubation Date/Time: 08/29/2017 1:47 PM Performed by: Asher Muir, CRNA Pre-anesthesia Checklist: Patient identified, Emergency Drugs available, Suction available and Patient being monitored Patient Re-evaluated:Patient Re-evaluated prior to induction Oxygen Delivery Method: Circle system utilized Preoxygenation: Pre-oxygenation with 100% oxygen Induction Type: IV induction Ventilation: Mask ventilation without difficulty Laryngoscope Size: Mac and 3 Grade View: Grade II Tube size: 7.0 mm Number of attempts: 1 Placement Confirmation: ETT inserted through vocal cords under direct vision,  positive ETCO2 and breath sounds checked- equal and bilateral Secured at: 20 cm Tube secured with: Tape Dental Injury: Teeth and Oropharynx as per pre-operative assessment

## 2017-08-29 NOTE — Anesthesia Postprocedure Evaluation (Signed)
Anesthesia Post Note  Patient: Krystal Hartman  Procedure(s) Performed: HYSTEROSCOPY WITH INTRAUTERINE DEVICE (IUD) REMOVAL (N/A Uterus)     Patient location during evaluation: PACU Anesthesia Type: General Level of consciousness: awake and alert Pain management: pain level controlled Vital Signs Assessment: post-procedure vital signs reviewed and stable Respiratory status: spontaneous breathing, nonlabored ventilation, respiratory function stable and patient connected to nasal cannula oxygen Cardiovascular status: blood pressure returned to baseline and stable Postop Assessment: no apparent nausea or vomiting Anesthetic complications: no    Last Vitals:  Vitals:   08/29/17 1526 08/29/17 1600  BP: 137/64 (!) 151/78  Pulse: 84 77  Resp: 20 18  Temp: 36.9 C 37 C  SpO2: 95% 100%    Last Pain:  Vitals:   08/29/17 1600  TempSrc:   PainSc: 0-No pain   Pain Goal: Patients Stated Pain Goal: 5 (08/29/17 1526)               Porchea Charrier P Lisandra Mathisen

## 2017-08-29 NOTE — Anesthesia Preprocedure Evaluation (Signed)
Anesthesia Evaluation  Patient identified by MRN, date of birth, ID band Patient awake    Reviewed: Allergy & Precautions, NPO status , Patient's Chart, lab work & pertinent test results  History of Anesthesia Complications (+) PONV and history of anesthetic complications  Airway Mallampati: II  TM Distance: >3 FB Neck ROM: Full    Dental no notable dental hx.    Pulmonary neg pulmonary ROS,    Pulmonary exam normal breath sounds clear to auscultation       Cardiovascular hypertension, Pt. on medications Normal cardiovascular exam Rhythm:Regular Rate:Normal     Neuro/Psych negative neurological ROS  negative psych ROS   GI/Hepatic negative GI ROS, Neg liver ROS,   Endo/Other  Morbid obesity  Renal/GU negative Renal ROS     Musculoskeletal negative musculoskeletal ROS (+)   Abdominal (+) + obese,   Peds  Hematology  (+) anemia ,   Anesthesia Other Findings Super obese  Reproductive/Obstetrics IUD WITH BROKEN STRINGS                             Anesthesia Physical Anesthesia Plan  ASA: IV  Anesthesia Plan: General   Post-op Pain Management:    Induction: Intravenous  PONV Risk Score and Plan: 4 or greater and Scopolamine patch - Pre-op, Midazolam, Dexamethasone, Ondansetron and Treatment may vary due to age or medical condition  Airway Management Planned: Oral ETT  Additional Equipment:   Intra-op Plan:   Post-operative Plan: Extubation in OR  Informed Consent: I have reviewed the patients History and Physical, chart, labs and discussed the procedure including the risks, benefits and alternatives for the proposed anesthesia with the patient or authorized representative who has indicated his/her understanding and acceptance.   Dental advisory given  Plan Discussed with: CRNA  Anesthesia Plan Comments:         Anesthesia Quick Evaluation

## 2017-08-29 NOTE — Op Note (Signed)
PROCEDURE DATE: 06/25/2014  PREOPERATIVE DIAGNOSES: Embedded IUD,  failed attempt of in office removal POSTOPERATIVE DIAGNOSES: The same PROCEDURE: Diagnostic hysteroscopy,  IUD Removal  SURGEON: Dr. Jaynie CollinsUgonna Shlonda Dolloff   INDICATIONS: 27 y.o. [name] here for IUD removal under anesthesia secondary to failed in office IUD removal attempt. The risks of surgery were discussed in detail with the patient including but not limited to: bleeding; infection which may require antibiotics; injury to uterus or surrounding organs which may involve bowel, bladder, ureters; need for additional procedures including laparoscopy or laparotomy; thromboembolic phenomenon, surgical site problems and other postoperative/anesthesia complications. Written informed consent was obtained.   FINDINGS: Small uterus, nulliparous cervix. IUD noted to be embedded in lower uterine segment but was able to be removed intact.  ANESTHESIA: General, paracervical using 30 ml of 0.5% Marcaine ESTIMATED BLOOD LOSS: 50 ml COMPLICATIONS: None immediate  PROCEDURE IN DETAIL: The patient was then taken to the operating room where general anesthesia was administered and was found to be adequate. She was placed in the dorsal lithotomy position, and was prepped and draped in a sterile manner. After an adequate timeout was performed, attention was turned to her pelvis where a speculum was placed in the vagina.  The cervix was visualized and grasped anteriorly with a single tooth tenaculum.  Local analgesia was administered as a paracervical block. Small ring forceps were introduced into the endometrial cavity.  The IUD was not palpated at first, so the diagnostic hysteroscope was inserted under direct visualization using normal saline as a suspension medium.  The uterine cavity was carefully examined with the findings as noted above. A hysteroscopy grasper was used to dislodge the IUD and bring it closer to the internal cervical os and canal.    After further careful visualization of the uterine cavity, the hysteroscope was removed under direct visualization.  The small ring forceps were then introduced into the cervical canal and the IUD was grasped and removed in its entirety.  The tenaculum was removed from the anterior lip of the cervix and the vaginal speculum was removed after noting good hemostasis.  Sponge and instrument counts were correct.  The patient tolerated the procedure well and was taken to the recovery area awake, and in stable condition.   The patient will be discharged to home as per PACU criteria.  She was prescribed Ibuprofen for pain. Routine postoperative instructions given.  She will follow up in clinic as needed; she is undecided for future contraception.   Jaynie CollinsUGONNA  Naiomy Watters, MD, FACOG Obstetrician & Gynecologist, [facility] for [facility], [facility]

## 2017-08-30 ENCOUNTER — Encounter (HOSPITAL_COMMUNITY): Payer: Self-pay | Admitting: Obstetrics & Gynecology

## 2017-08-31 ENCOUNTER — Encounter: Payer: Self-pay | Admitting: Obstetrics & Gynecology

## 2018-11-07 ENCOUNTER — Ambulatory Visit: Payer: Medicaid Other

## 2018-11-14 ENCOUNTER — Telehealth: Payer: Self-pay | Admitting: Family Medicine

## 2018-11-14 NOTE — Telephone Encounter (Signed)
Attempted to call patient about COVID-19 related questions. A message was left on her VM.

## 2018-11-18 ENCOUNTER — Encounter: Payer: Self-pay | Admitting: Obstetrics and Gynecology

## 2018-11-18 ENCOUNTER — Other Ambulatory Visit: Payer: Self-pay

## 2018-11-18 ENCOUNTER — Ambulatory Visit (INDEPENDENT_AMBULATORY_CARE_PROVIDER_SITE_OTHER): Payer: Medicaid Other | Admitting: Lactation Services

## 2018-11-18 VITALS — BP 134/80 | HR 83 | Wt 395.4 lb

## 2018-11-18 DIAGNOSIS — Z3201 Encounter for pregnancy test, result positive: Secondary | ICD-10-CM | POA: Diagnosis not present

## 2018-11-18 NOTE — Progress Notes (Signed)
Pt here for Urine Pregnancy Test. Pregnancy Test +. She reports her LMP 09/03/18 with EDD 06/10/2019 making Pt 10 weeks 6 days GA.    Follow up Nurse visit and postpartum visit has been scheduled.   Pt reports she is taking no meds. Encouraged PNV. Pt reports they make her sick and she prefers a gummy vitamin. She is having difficulty finding a Vegan Gummy Vitamin. Discussed trying [employer], [employer] or [employer].    Pt has MyChart and does not BP cuff at home.

## 2018-11-19 ENCOUNTER — Telehealth: Payer: Medicaid Other | Admitting: *Deleted

## 2018-11-19 ENCOUNTER — Encounter: Payer: Self-pay | Admitting: *Deleted

## 2018-11-19 DIAGNOSIS — O0990 Supervision of high risk pregnancy, unspecified, unspecified trimester: Secondary | ICD-10-CM

## 2018-11-19 LAB — POCT PREGNANCY, URINE: Preg Test, Ur: POSITIVE — AB

## 2018-11-19 NOTE — Progress Notes (Signed)
Called pt mobile phone # @ [phone] for scheduled MyChart video visit. She did not answer and VM message was left stating the reason for my call and that I will call back in a few minutes. I also left message on pt's home telephone number. At [phone] I called pt's mobile phone again and she did not answer. VM was left stating that she will need to contact our office to reschedule the appointment. Mychart message also sent to pt.

## 2018-11-21 ENCOUNTER — Telehealth: Payer: Medicaid Other | Admitting: *Deleted

## 2018-11-21 ENCOUNTER — Telehealth: Payer: Medicaid Other

## 2018-11-21 ENCOUNTER — Other Ambulatory Visit: Payer: Self-pay

## 2018-11-21 DIAGNOSIS — O09299 Supervision of pregnancy with other poor reproductive or obstetric history, unspecified trimester: Secondary | ICD-10-CM | POA: Insufficient documentation

## 2018-11-21 DIAGNOSIS — O0990 Supervision of high risk pregnancy, unspecified, unspecified trimester: Secondary | ICD-10-CM | POA: Insufficient documentation

## 2018-11-21 HISTORY — DX: Supervision of pregnancy with other poor reproductive or obstetric history, unspecified trimester: O09.299

## 2018-11-21 MED ORDER — AMBULATORY NON FORMULARY MEDICATION: 1.0000 | 0 refills | Status: DC
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
## 2018-11-22 NOTE — Progress Notes (Signed)
I have reviewed the chart and agree with nursing staff's documentation of this patient's encounter.  Swayze Pries, MD 11/22/2018 2:32 PM    

## 2018-11-29 ENCOUNTER — Telehealth: Payer: Self-pay | Admitting: Obstetrics & Gynecology

## 2018-11-29 NOTE — Telephone Encounter (Signed)
Attempted to call patient to ask her about any symptoms, and to wear her mask the whole visit covering her nose and mouth. Also, to sanitize her hands upon arriving.

## 2018-12-02 ENCOUNTER — Other Ambulatory Visit: Payer: Self-pay

## 2018-12-02 ENCOUNTER — Other Ambulatory Visit: Payer: Medicaid Other

## 2018-12-02 ENCOUNTER — Other Ambulatory Visit (HOSPITAL_COMMUNITY)
Admission: RE | Admit: 2018-12-02 | Discharge: 2018-12-02 | Disposition: A | Discharge status: Home / Self Care | Payer: Medicaid Other | Source: Ambulatory Visit | Attending: Obstetrics & Gynecology | Admitting: Obstetrics & Gynecology

## 2018-12-02 ENCOUNTER — Ambulatory Visit (INDEPENDENT_AMBULATORY_CARE_PROVIDER_SITE_OTHER): Payer: Self-pay | Admitting: Obstetrics & Gynecology

## 2018-12-02 ENCOUNTER — Encounter: Payer: Self-pay | Admitting: Obstetrics & Gynecology

## 2018-12-02 VITALS — BP 146/88 | HR 95 | Wt 392.0 lb

## 2018-12-02 DIAGNOSIS — Z3A12 12 weeks gestation of pregnancy: Secondary | ICD-10-CM

## 2018-12-02 DIAGNOSIS — O09299 Supervision of pregnancy with other poor reproductive or obstetric history, unspecified trimester: Secondary | ICD-10-CM

## 2018-12-02 DIAGNOSIS — O0991 Supervision of high risk pregnancy, unspecified, first trimester: Secondary | ICD-10-CM | POA: Insufficient documentation

## 2018-12-02 DIAGNOSIS — O99211 Obesity complicating pregnancy, first trimester: Secondary | ICD-10-CM

## 2018-12-02 DIAGNOSIS — O09291 Supervision of pregnancy with other poor reproductive or obstetric history, first trimester: Secondary | ICD-10-CM

## 2018-12-02 DIAGNOSIS — O0990 Supervision of high risk pregnancy, unspecified, unspecified trimester: Secondary | ICD-10-CM | POA: Diagnosis not present

## 2018-12-02 DIAGNOSIS — I10 Essential (primary) hypertension: Secondary | ICD-10-CM

## 2018-12-02 DIAGNOSIS — Z3481 Encounter for supervision of other normal pregnancy, first trimester: Secondary | ICD-10-CM | POA: Diagnosis not present

## 2018-12-02 DIAGNOSIS — O34219 Maternal care for unspecified type scar from previous cesarean delivery: Secondary | ICD-10-CM | POA: Insufficient documentation

## 2018-12-02 MED ORDER — AMLODIPINE BESYLATE 10 MG PO TABS: 10.0000 mg | ORAL_TABLET | Freq: Every day | ORAL | 2 refills | Status: DC

## 2018-12-02 MED ORDER — ASPIRIN EC 81 MG PO TBEC: 81.0000 mg | DELAYED_RELEASE_TABLET | Freq: Every day | ORAL | 2 refills | Status: DC

## 2018-12-02 NOTE — Progress Notes (Signed)
Subjective:    Krystal Hartman is a [id number] 154w6d being seen today for her first obstetrical visit.  Her obstetrical history is significant for previous cesarean X3, obese, CHTN, previous preeclampsia. Patient does intend to breast feed. Pregnancy history fully reviewed.  Patient reports nausea.  Vitals:   12/02/18 0859  BP: (!) 146/88  Pulse: 95  Weight: (!) 392 lb (177.8 kg)    HISTORY: OB History  Gravida Para Term Preterm AB Living  5 3 3  0 1 3  SAB TAB Ectopic Multiple Live Births  1 0 0 0 3    # Outcome Date GA Lbr Len/2nd Weight Sex Delivery Anes PTL Lv  5 Current           4 Term 12/11/12 2939w0d  9 lb 5.7 oz (4.245 kg) M CS-LTranv Spinal, EPI  LIV     Birth Comments: repeat c/s  3 Term 2013 5773w2d   F CS-LTranv   LIV     Birth Comments: repeat c/s, Hospitlallized for Polyhydraminous , preeclampsia for 2 months  2 SAB 2012 7059w0d         1 Term 11/10/09 3211w0d  7 lb 3 oz (3.26 kg) M CS-LTranv   LIV     Birth Comments: facial presentation, c/s due to ftp, IOL fo preeclampsia.    Past Medical History:  Diagnosis Date  . Anemia   . Boil    on side  . Cesarean section wound complication 06/25/2011   Consider wound vac   . Hypertension    started on BP med yesterday  . 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 None present at PAT appt.  Marland Kitchen. PONV (postoperative nausea and vomiting) 2013   pt. states she had a lot of nausea and vomiting after her last c[facility]s  [facility]  . Pregnancy induced hypertension   . Previous cesarean delivery, antepartum condition or complication 08/19/2012   For tertiary C/S, no tubal    Past Surgical History:  Procedure Laterality Date  . CESAREAN SECTION    . CESAREAN SECTION  06/22/2011   Procedure: CESAREAN SECTION;  Surgeon: Hal MoralesVanessa P Haygood, MD;  Location: [facility];  Service: Gynecology;  Laterality: N/A;  MRSA positive, Latex  Allergy  . CESAREAN SECTION N/A 12/11/2012   Procedure: REPEAT CESAREAN SECTION;  Surgeon: Adam PhenixJames G Arnold, MD;  Location: [facility];  Service: Obstetrics;  Laterality: N/A;  . EYE SURGERY    . IUD REMOVAL N/A 08/29/2017   Procedure: HYSTEROSCOPY WITH INTRAUTERINE DEVICE (IUD) REMOVAL;  Surgeon: Tereso NewcomerAnyanwu, Ugonna A, MD;  Location: [facility];  Service: Gynecology;  Laterality: N/A;   Family History  Problem Relation Age of Onset  . Diabetes Mother   . Diabetes Maternal Aunt   . Diabetes Father   . Heart disease Father   . Colon cancer Father   . Cancer Maternal Grandmother      Exam    Uterus:     Pelvic Exam:    Perineum: No Hemorrhoids   Vulva: normal   Vagina:  normal mucosa   pH:    Cervix: no lesions   Adnexa: normal adnexa   Bony Pelvis: average  System: Breast:  normal appearance, no masses or tenderness   Skin: normal coloration and turgor, no rashes    Neurologic: oriented, normal mood   Extremities: normal strength, tone, and muscle mass   HEENT PERRLA   Mouth/Teeth mucous membranes moist,  pharynx normal without lesions   Neck no masses   Cardiovascular: regular rate and rhythm, no murmurs or gallops   Respiratory:  appears well, vitals normal, no respiratory distress, acyanotic, normal RR, neck free of mass or lymphadenopathy, chest clear, no wheezing, crepitations, rhonchi, normal symmetric air entry   Abdomen: obese, large pannous   Urinary: urethral meatus normal      Assessment:    Pregnancy: [id number] Patient Active Problem List   Diagnosis Date Noted  . Previous cesarean delivery affecting pregnancy, antepartum 12/02/2018  . Supervision of high-risk pregnancy 11/21/2018  . Hx of preeclampsia, prior pregnancy, currently pregnant 11/21/2018  . IUD complication ([place]) 02/63/7858  . Chronic hypertension 06/10/2011  . Morbid obesity ([place]) 05/25/2011  . Hidradenitis suppurativa 05/25/2011  . 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 Prenatal vitamins. Problem list reviewed and updated. Genetic Screening discussed Panorama.  Ultrasound discussed; fetal survey: ordered.  Follow up in 4 weeks. 50% of 30 min visit spent on counseling and coordination of care.  ASA daily, restart Norvasc. BP cuff for home  Woodroe Mode, MD 12/02/2018    Emeterio Reeve 12/02/2018

## 2018-12-02 NOTE — Patient Instructions (Signed)

## 2018-12-03 ENCOUNTER — Encounter: Payer: Self-pay | Admitting: Family Medicine

## 2018-12-05 ENCOUNTER — Other Ambulatory Visit: Payer: Self-pay | Admitting: Obstetrics & Gynecology

## 2018-12-05 ENCOUNTER — Telehealth: Payer: Self-pay | Admitting: General Practice

## 2018-12-05 DIAGNOSIS — B9689 Other specified bacterial agents as the cause of diseases classified elsewhere: Secondary | ICD-10-CM

## 2018-12-05 DIAGNOSIS — N3090 Cystitis, unspecified without hematuria: Secondary | ICD-10-CM

## 2018-12-05 LAB — CYTOLOGY - PAP
Chlamydia: NEGATIVE
Diagnosis: UNDETERMINED — AB
HPV: NOT DETECTED
Neisseria Gonorrhea: NEGATIVE

## 2018-12-05 LAB — CULTURE, OB URINE

## 2018-12-05 LAB — URINE CULTURE, OB REFLEX

## 2018-12-05 MED ORDER — CEPHALEXIN 500 MG PO CAPS: 500.0000 mg | ORAL_CAPSULE | Freq: Three times a day (TID) | ORAL | 0 refills | Status: DC

## 2018-12-05 NOTE — Progress Notes (Signed)
Keflex for k. Pneumoniae bacteriuria

## 2018-12-05 NOTE — Telephone Encounter (Signed)
Received alert from Babys[employer]s, patient had elevated BP 145/85. Per chart review, patient saw Dr Roselie Awkward on 6/15 with similar BP. Hx of chronic HTN and was started on norvasc 10mg . Reviewed with Dr Roselie Awkward today who states no intervention is needed at this time- will continue to monitor.

## 2018-12-09 NOTE — Progress Notes (Signed)
Patient ID: RUPAL CHILDRESS, female   DOB: August 14, 1990, 28 y.o.   MRN: [id number] I have reviewed the chart and agree with nursing staff's documentation of this patient's encounter.  Emeterio Reeve, MD 12/09/2018 10:14 PM

## 2018-12-10 ENCOUNTER — Encounter: Payer: Self-pay | Admitting: *Deleted

## 2018-12-10 ENCOUNTER — Telehealth (INDEPENDENT_AMBULATORY_CARE_PROVIDER_SITE_OTHER): Payer: Self-pay | Admitting: *Deleted

## 2018-12-10 DIAGNOSIS — O285 Abnormal chromosomal and genetic finding on antenatal screening of mother: Secondary | ICD-10-CM

## 2018-12-10 NOTE — Telephone Encounter (Signed)
Received a voice message this am from [employer]. She states her panorama came back high risk for either vanishing twin or unrecognized twin pregnancy or increased risk for triploidy. States if she has a known twin pregnancy to call [employer] back and they can rerun the test. States if it is not a twin pregnancy or vanishing twin pregnancy they recommend genetic counseling and potentially diagnostic testing. States they are faxing the report . Also states may call back if questions.

## 2018-12-11 ENCOUNTER — Other Ambulatory Visit: Payer: Self-pay | Admitting: *Deleted

## 2018-12-11 DIAGNOSIS — O0990 Supervision of high risk pregnancy, unspecified, unspecified trimester: Secondary | ICD-10-CM

## 2018-12-11 DIAGNOSIS — O09299 Supervision of pregnancy with other poor reproductive or obstetric history, unspecified trimester: Secondary | ICD-10-CM

## 2018-12-11 MED ORDER — AMBULATORY NON FORMULARY MEDICATION
1.0000 | 0 refills | Status: DC
Start: 2018-12-11 — End: 2022-03-13

## 2018-12-11 NOTE — Progress Notes (Signed)
Pt sent mychart message requesting larger cuff.  Order for larger cuff placed and message sent to front office staff to fax prescription.

## 2018-12-12 ENCOUNTER — Other Ambulatory Visit: Payer: Self-pay | Admitting: Obstetrics & Gynecology

## 2018-12-12 DIAGNOSIS — O285 Abnormal chromosomal and genetic finding on antenatal screening of mother: Secondary | ICD-10-CM

## 2018-12-12 NOTE — Progress Notes (Unsigned)
mfm u/s ordered due to abnormal Panorama result

## 2018-12-17 NOTE — Telephone Encounter (Signed)
Called and spoke with pt to inform her that her Krystal Hartman results came back showing an abnormality and Dr. Roselie Awkward would like to rule out a Twin pregnancy vs a Vanishing Twin. Pt wanted to know if the sex of the infant has been determined, informed her that per Krystal Hartman,  they were unable to determine due to the above. She was informed that her [place] has been rescheduled for 7/8 @ 2:45. Pt voiced understanding. Pt with no further questions/concerns at this time.

## 2018-12-17 NOTE — Telephone Encounter (Signed)
Patient needs [place] due to abnl Panorama possible vanishing twin, 14 weeks now

## 2018-12-18 LAB — INHERITEST(R) CF/SMA PANEL

## 2018-12-18 LAB — OBSTETRIC PANEL, INCLUDING HIV
Antibody Screen: NEGATIVE
Basophils Absolute: 0 10*3/uL (ref 0.0–0.2)
Basos: 0 %
EOS (ABSOLUTE): 0.2 10*3/uL (ref 0.0–0.4)
Eos: 3 %
HIV Screen 4th Generation wRfx: NONREACTIVE
Hematocrit: 31 % — ABNORMAL LOW (ref 34.0–46.6)
Hemoglobin: 9.6 g/dL — ABNORMAL LOW (ref 11.1–15.9)
Hepatitis B Surface Ag: NEGATIVE
Immature Grans (Abs): 0 10*3/uL (ref 0.0–0.1)
Immature Granulocytes: 0 %
Lymphocytes Absolute: 2.6 10*3/uL (ref 0.7–3.1)
Lymphs: 37 %
MCH: 23.3 pg — ABNORMAL LOW (ref 26.6–33.0)
MCHC: 31 g/dL — ABNORMAL LOW (ref 31.5–35.7)
MCV: 75 fL — ABNORMAL LOW (ref 79–97)
Monocytes Absolute: 0.4 10*3/uL (ref 0.1–0.9)
Monocytes: 6 %
Neutrophils Absolute: 3.7 10*3/uL (ref 1.4–7.0)
Neutrophils: 54 %
Platelets: 306 10*3/uL (ref 150–450)
RBC: 4.12 x10E6/uL (ref 3.77–5.28)
RDW: 18.7 % — ABNORMAL HIGH (ref 11.7–15.4)
RPR Ser Ql: NONREACTIVE
Rh Factor: POSITIVE
Rubella Antibodies, IGG: 3.22 index (ref >0.99)
WBC: 6.9 10*3/uL (ref 3.4–10.8)

## 2018-12-18 LAB — COMPREHENSIVE METABOLIC PANEL
ALT: 11 IU/L (ref 0–32)
AST: 14 IU/L (ref 0–40)
Albumin/Globulin Ratio: 0.9 — ABNORMAL LOW (ref 1.2–2.2)
Albumin: 3.8 g/dL — ABNORMAL LOW (ref 3.9–5.0)
Alkaline Phosphatase: 76 IU/L (ref 39–117)
BUN/Creatinine Ratio: 13 (ref 9–23)
BUN: 8 mg/dL (ref 6–20)
Bilirubin Total: 0.3 mg/dL (ref 0.0–1.2)
CO2: 17 mmol/L — ABNORMAL LOW (ref 20–29)
Calcium: 8.9 mg/dL (ref 8.7–10.2)
Chloride: 100 mmol/L (ref 96–106)
Creatinine, Ser: 0.63 mg/dL (ref 0.57–1.00)
GFR calc Af Amer: 140 mL/min/{1.73_m2} (ref >59)
GFR calc non Af Amer: 122 mL/min/{1.73_m2} (ref >59)
Globulin, Total: 4.2 g/dL (ref 1.5–4.5)
Glucose: 74 mg/dL (ref 65–99)
Potassium: 3.8 mmol/L (ref 3.5–5.2)
Sodium: 133 mmol/L — ABNORMAL LOW (ref 134–144)
Total Protein: 8 g/dL (ref 6.0–8.5)

## 2018-12-18 LAB — HEMOGLOBINOPATHY EVALUATION
Ferritin: 82 ng/mL (ref 15–150)
Hgb A2 Quant: 1.7 % — ABNORMAL LOW (ref 1.8–3.2)
Hgb A: 98.3 % (ref 96.4–98.8)
Hgb C: 0 %
Hgb F Quant: 0 % (ref 0.0–2.0)
Hgb S: 0 %
Hgb Solubility: NEGATIVE
Hgb Variant: 0 %

## 2018-12-18 LAB — PROTEIN / CREATININE RATIO, URINE
Creatinine, Urine: 84.2 mg/dL
Protein, Ur: 26.6 mg/dL
Protein/Creat Ratio: 316 mg/g creat — ABNORMAL HIGH (ref 0–200)

## 2018-12-18 LAB — ALPHA-THALASSEMIA

## 2018-12-24 ENCOUNTER — Telehealth: Payer: Self-pay | Admitting: General Practice

## 2018-12-24 ENCOUNTER — Telehealth: Payer: Self-pay | Admitting: *Deleted

## 2018-12-24 NOTE — Telephone Encounter (Signed)
Patient called and left message on nurse voicemail line stating she is returning our phone call, please call back.

## 2018-12-24 NOTE — Telephone Encounter (Signed)
-----   Message from Woodroe Mode, MD sent at 12/24/2018  9:14 AM EDT ----- ASCUS neg HR HPV, cotesting in 3 years ----- Message ----- From: Dolores Hoose, RN Sent: 12/24/2018   9:04 AM EDT To: Woodroe Mode, MD, [facility]  Pt had abnormal pap smear on 12/02/18.  Please advise.

## 2018-12-24 NOTE — Telephone Encounter (Signed)
Returned pt's call.  Pt did not pick up.  Left voicemail advising pt that I was returning her call and that, if she continues to have questions or concerns, to contact the clinic.

## 2018-12-24 NOTE — Telephone Encounter (Signed)
Called pt regarding pap results to inform her that she will need a repeat pap with co-testing in 3 years.  Pt did not pick up.  Left message advising pt that she was being contacted regarding results and requesting she contact the clinic.  Mychart message sent.

## 2018-12-25 ENCOUNTER — Other Ambulatory Visit: Payer: Self-pay

## 2018-12-25 ENCOUNTER — Encounter (HOSPITAL_COMMUNITY): Payer: Self-pay

## 2018-12-25 ENCOUNTER — Ambulatory Visit (HOSPITAL_COMMUNITY): Payer: Medicaid Other | Admitting: *Deleted

## 2018-12-25 ENCOUNTER — Other Ambulatory Visit: Payer: Self-pay | Admitting: Obstetrics & Gynecology

## 2018-12-25 ENCOUNTER — Ambulatory Visit (HOSPITAL_COMMUNITY)
Admission: RE | Admit: 2018-12-25 | Discharge: 2018-12-25 | Disposition: A | Discharge status: Home / Self Care | Payer: Medicaid Other | Source: Ambulatory Visit | Attending: Obstetrics & Gynecology | Admitting: Obstetrics & Gynecology

## 2018-12-25 DIAGNOSIS — O99212 Obesity complicating pregnancy, second trimester: Secondary | ICD-10-CM

## 2018-12-25 DIAGNOSIS — O0990 Supervision of high risk pregnancy, unspecified, unspecified trimester: Secondary | ICD-10-CM

## 2018-12-25 DIAGNOSIS — O30042 Twin pregnancy, dichorionic/diamniotic, second trimester: Secondary | ICD-10-CM | POA: Diagnosis not present

## 2018-12-25 DIAGNOSIS — Z3A15 15 weeks gestation of pregnancy: Secondary | ICD-10-CM | POA: Diagnosis not present

## 2018-12-25 DIAGNOSIS — Z363 Encounter for antenatal screening for malformations: Secondary | ICD-10-CM | POA: Diagnosis not present

## 2018-12-25 DIAGNOSIS — O0991 Supervision of high risk pregnancy, unspecified, first trimester: Secondary | ICD-10-CM

## 2018-12-25 DIAGNOSIS — O09299 Supervision of pregnancy with other poor reproductive or obstetric history, unspecified trimester: Secondary | ICD-10-CM | POA: Diagnosis not present

## 2018-12-30 ENCOUNTER — Telehealth: Payer: Medicaid Other | Admitting: Obstetrics & Gynecology

## 2018-12-31 ENCOUNTER — Encounter: Payer: Self-pay | Admitting: General Practice

## 2019-01-03 ENCOUNTER — Telehealth (INDEPENDENT_AMBULATORY_CARE_PROVIDER_SITE_OTHER): Payer: Medicaid Other | Admitting: Obstetrics & Gynecology

## 2019-01-03 ENCOUNTER — Other Ambulatory Visit: Payer: Self-pay

## 2019-01-03 VITALS — BP 139/83

## 2019-01-03 DIAGNOSIS — O09299 Supervision of pregnancy with other poor reproductive or obstetric history, unspecified trimester: Secondary | ICD-10-CM

## 2019-01-03 DIAGNOSIS — O09292 Supervision of pregnancy with other poor reproductive or obstetric history, second trimester: Secondary | ICD-10-CM | POA: Diagnosis not present

## 2019-01-03 DIAGNOSIS — O30049 Twin pregnancy, dichorionic/diamniotic, unspecified trimester: Secondary | ICD-10-CM | POA: Insufficient documentation

## 2019-01-03 DIAGNOSIS — Z3A17 17 weeks gestation of pregnancy: Secondary | ICD-10-CM

## 2019-01-03 DIAGNOSIS — O24012 Pre-existing diabetes mellitus, type 1, in pregnancy, second trimester: Secondary | ICD-10-CM

## 2019-01-03 DIAGNOSIS — O0992 Supervision of high risk pregnancy, unspecified, second trimester: Secondary | ICD-10-CM | POA: Diagnosis not present

## 2019-01-03 DIAGNOSIS — O30042 Twin pregnancy, dichorionic/diamniotic, second trimester: Secondary | ICD-10-CM | POA: Diagnosis not present

## 2019-01-03 DIAGNOSIS — I10 Essential (primary) hypertension: Secondary | ICD-10-CM

## 2019-01-03 NOTE — Progress Notes (Signed)
   TELEHEALTH VIRTUAL OBSTETRICS VISIT ENCOUNTER NOTE  I connected with Krystal Hartman on 01/03/19 at  8:15 AM EDT by telephone at home and verified that I am speaking with the correct person using two identifiers.   I discussed the limitations, risks, security and privacy concerns of performing an evaluation and management service by telephone and the availability of in person appointments. I also discussed with the patient that there may be a patient responsible charge related to this service. The patient expressed understanding and agreed to proceed.  Subjective:  Krystal Hartman is a 28 y.o. [phone] at [redacted]w[redacted]d being followed for ongoing prenatal care.  She is currently monitored for the following issues for this high-risk pregnancy and has Morbid obesity ([place]); Hidradenitis suppurativa; HSV-2 (herpes simplex virus 2) infection; Chronic hypertension; IUD complication ([place]); Supervision of high-risk pregnancy; Hx of preeclampsia, prior pregnancy, currently pregnant; Previous cesarean delivery affecting pregnancy, antepartum; and Dichorionic diamniotic twin gestation on their problem list.  Patient reports no complaints. Reports fetal movement. Denies any contractions, bleeding or leaking of fluid.   The following portions of the patient's history were reviewed and updated as appropriate: allergies, current medications, past family history, past medical history, past social history, past surgical history and problem list.   Objective:   General:  Alert, oriented and cooperative.   Mental Status: Normal mood and affect perceived. Normal judgment and thought content.  Rest of physical exam deferred due to type of encounter  Assessment and Plan:  Pregnancy: G5P3013 at [redacted]w[redacted]d 1. Dichorionic diamniotic twin pregnancy in second trimester Dx 12/25/18  2. Supervision of high risk pregnancy in second trimester  Reinterpret Panorama for twins  3. Chronic hypertension Amlodipine daily  4. Hx of  preeclampsia, prior pregnancy, currently pregnant ASA daily  Preterm labor symptoms and general obstetric precautions including but not limited to vaginal bleeding, contractions, leaking of fluid and fetal movement were reviewed in detail with the patient.  I discussed the assessment and treatment plan with the patient. The patient was provided an opportunity to ask questions and all were answered. The patient agreed with the plan and demonstrated an understanding of the instructions. The patient was advised to call back or seek an in-person office evaluation/go to [facility]U at [facility] for any urgent or concerning symptoms. Please refer to After Visit Summary for other counseling recommendations.   I provided 12 minutes of non-face-to-face time during this encounter.  Return in about 4 weeks (around 01/31/2019) for in person AFP.  Future Appointments  Date Time Provider [place]  01/27/2019  9:30 AM [facility] [place]    Emeterio Reeve, [place] for [place]

## 2019-01-03 NOTE — Patient Instructions (Signed)
Multiple Pregnancy Having a multiple pregnancy means that a woman is carrying more than one baby at a time. She may be pregnant with twins, triplets, or more. The majority of multiple pregnancies are twins. Naturally conceiving triplets or more (higher-order multiples) is rare. Multiple pregnancies are riskier than single pregnancies. A woman with a multiple pregnancy is more likely to have certain problems during her pregnancy. Therefore, she will need to have more frequent appointments for prenatal care. How does a multiple pregnancy happen? A multiple pregnancy happens when:  The woman's body releases more than one egg at a time, and then each egg gets fertilized by a different sperm. ? This is the most common type of multiple pregnancy. ? Twins or other multiples produced this way are fraternal. They are no more alike than non-multiple siblings are.  One sperm fertilizes one egg, which then divides into more than one embryo. ? Twins or other multiples produced this way are identical. Identical multiples are always the same gender, and they look very much alike. Who is most likely to have a multiple pregnancy? A multiple pregnancy is more likely to develop in women who:  Have had fertility treatment, especially if the treatment included fertility drugs.  Are older than 30 years of age.  Have already had four or more children.  Have a family history of multiple pregnancy. How is a multiple pregnancy diagnosed? A multiple pregnancy may be diagnosed based on:  Symptoms such as: ? Rapid weight gain in the first 3 months of pregnancy (first trimester). ? More severe nausea and breast tenderness than what is typical of a single pregnancy. ? The uterus measuring larger than what is normal for the stage of the pregnancy.  Blood tests that detect a higher-than-normal level of human chorionic gonadotropin (hCG). This is a hormone that your body produces in early pregnancy.  Ultrasound exam.  This is used to confirm that you are carrying multiples. What risks are associated with multiple pregnancy? A multiple pregnancy puts you at a higher risk for certain problems during or after your pregnancy, including:  Having your babies delivered before you have reached a full-term pregnancy (preterm birth). A full-term pregnancy lasts for at least 37 weeks. Babies born before 75 weeks may have a higher risk of a variety of health problems, such as breathing problems, feeding difficulties, cerebral palsy, and learning disabilities.  Diabetes.  Preeclampsia. This is a serious condition that causes high blood pressure along with other symptoms, such as swelling and headaches, during pregnancy.  Excessive blood loss after childbirth (postpartum hemorrhage).  Postpartum depression.  Low birth weight of the babies. How will having a multiple pregnancy affect my care? Your health care provider will want to monitor you more closely during your pregnancy to make sure that your babies are growing normally and that you are healthy. Follow these instructions at home: Because your pregnancy is considered to be high risk, you will need to work closely with your health care team. You may also need to make some lifestyle changes. These may include the following: Eating and drinking  Increase your nutrition. ? Follow your health care provider's recommendations for weight gain. You may need to gain a little extra weight when you are pregnant with multiples. ? Eat healthy snacks often throughout the day. This can add calories and reduce nausea.  Drink enough fluid to keep your urine pale yellow.  Take prenatal vitamins. Activity By 20-24 weeks, you may need to limit your activities.  Avoid activities and work that take a lot of effort (are strenuous). °· Ask your health care provider when you should stop having sexual intercourse. °· Rest often. °General instructions °· Do not use any products that  contain nicotine or tobacco, such as cigarettes and e-cigarettes. If you need help quitting, ask your health care provider. °· Do not drink alcohol or use illegal drugs. °· Take over-the-counter and prescription medicines only as told by your health care provider. °· Arrange for extra help around the house. °· Keep all follow-up visits and all prenatal visits as told by your health care provider. This is important. °Contact a health care provider if: °· You have dizziness. °· You have persistent nausea, vomiting, or diarrhea. °· You are having trouble gaining weight. °· You have feelings of depression or other emotions that are interfering with your normal activities. °Get help right away if: °· You have a fever. °· You have pain with urination. °· You have fluid leaking from your vagina. °· You have a bad-smelling vaginal discharge. °· You notice increased swelling in your face, hands, legs, or ankles. °· You have spotting or bleeding from your vagina. °· You have pelvic cramps, pelvic pressure, or nagging pain in your abdomen or lower back. °· You are having regular contractions. °· You develop a severe headache, with or without visual changes. °· You have shortness of breath or chest pain. °· You notice less fetal movement, or no fetal movement. °Summary °· Having a multiple pregnancy means that a woman is carrying more than one baby at a time. °· A multiple pregnancy puts you at a higher risk for certain problems during and after your pregnancy, such as: having your babies delivered before you have reached a full-term pregnancy (preterm birth), diabetes, preeclampsia, excessive blood loss after childbirth (postpartum hemorrhage), postpartum depression, or low birth weight of the babies. °· Your health care provider will want to monitor you more closely during your pregnancy to make sure that your babies are growing normally and that you are healthy. °· You may need to make some lifestyle changes during  pregnancy, including: increasing your nutrition, limiting your activities after 20-24 weeks of pregnancy, and arranging for extra help around the house. °This information is not intended to replace advice given to you by your health care provider. Make sure you discuss any questions you have with your health care provider. °Document Released: 03/14/2008 Document Revised: 09/26/2018 Document Reviewed: 02/04/2016 °Elsevier Patient Education © 2020 Elsevier Inc. ° °

## 2019-01-08 ENCOUNTER — Encounter: Payer: Self-pay | Admitting: Family Medicine

## 2019-01-14 ENCOUNTER — Ambulatory Visit (HOSPITAL_COMMUNITY): Payer: Medicaid Other

## 2019-01-14 ENCOUNTER — Other Ambulatory Visit (HOSPITAL_COMMUNITY): Payer: Medicaid Other

## 2019-01-16 ENCOUNTER — Other Ambulatory Visit: Payer: Self-pay | Admitting: *Deleted

## 2019-01-16 ENCOUNTER — Encounter: Payer: Self-pay | Admitting: *Deleted

## 2019-01-16 DIAGNOSIS — K5900 Constipation, unspecified: Secondary | ICD-10-CM

## 2019-01-16 MED ORDER — DOCUSATE SODIUM 100 MG PO CAPS: 100.0000 mg | ORAL_CAPSULE | Freq: Two times a day (BID) | ORAL | 3 refills | Status: DC

## 2019-01-27 ENCOUNTER — Other Ambulatory Visit (HOSPITAL_COMMUNITY): Payer: Self-pay | Admitting: *Deleted

## 2019-01-27 ENCOUNTER — Other Ambulatory Visit: Payer: Self-pay

## 2019-01-27 ENCOUNTER — Other Ambulatory Visit: Payer: Self-pay | Admitting: Obstetrics & Gynecology

## 2019-01-27 ENCOUNTER — Encounter (HOSPITAL_COMMUNITY): Payer: Self-pay

## 2019-01-27 ENCOUNTER — Ambulatory Visit (HOSPITAL_COMMUNITY)
Admission: RE | Admit: 2019-01-27 | Discharge: 2019-01-27 | Disposition: A | Discharge status: Home / Self Care | Payer: Medicaid Other | Source: Ambulatory Visit | Attending: Obstetrics & Gynecology | Admitting: Obstetrics & Gynecology

## 2019-01-27 ENCOUNTER — Ambulatory Visit (HOSPITAL_COMMUNITY): Payer: Medicaid Other | Admitting: *Deleted

## 2019-01-27 VITALS — BP 134/81 | HR 102 | Temp 99.0°F

## 2019-01-27 DIAGNOSIS — O30043 Twin pregnancy, dichorionic/diamniotic, third trimester: Secondary | ICD-10-CM

## 2019-01-27 DIAGNOSIS — Z3A20 20 weeks gestation of pregnancy: Secondary | ICD-10-CM | POA: Diagnosis not present

## 2019-01-27 DIAGNOSIS — O0992 Supervision of high risk pregnancy, unspecified, second trimester: Secondary | ICD-10-CM | POA: Insufficient documentation

## 2019-01-27 DIAGNOSIS — O09299 Supervision of pregnancy with other poor reproductive or obstetric history, unspecified trimester: Secondary | ICD-10-CM

## 2019-01-27 DIAGNOSIS — O30042 Twin pregnancy, dichorionic/diamniotic, second trimester: Secondary | ICD-10-CM

## 2019-01-27 DIAGNOSIS — O99212 Obesity complicating pregnancy, second trimester: Secondary | ICD-10-CM | POA: Diagnosis not present

## 2019-01-27 DIAGNOSIS — O285 Abnormal chromosomal and genetic finding on antenatal screening of mother: Secondary | ICD-10-CM | POA: Diagnosis not present

## 2019-01-31 ENCOUNTER — Encounter: Payer: Medicaid Other | Admitting: Obstetrics and Gynecology

## 2019-02-10 ENCOUNTER — Telehealth: Payer: Self-pay | Admitting: Obstetrics and Gynecology

## 2019-02-10 NOTE — Telephone Encounter (Signed)
Spoke to patient about her appointment on 8/15 @ 9:15. Patient instructed to wear a face mask for the entire appointment and no visitors are allowed with her during the visit. Patient screened for covid symptoms and denied having any

## 2019-02-11 ENCOUNTER — Ambulatory Visit (INDEPENDENT_AMBULATORY_CARE_PROVIDER_SITE_OTHER): Payer: Medicaid Other | Admitting: Obstetrics and Gynecology

## 2019-02-11 ENCOUNTER — Encounter: Payer: Self-pay | Admitting: Family Medicine

## 2019-02-11 ENCOUNTER — Other Ambulatory Visit: Payer: Self-pay

## 2019-02-11 ENCOUNTER — Encounter: Payer: Self-pay | Admitting: Obstetrics and Gynecology

## 2019-02-11 VITALS — BP 117/77 | HR 100 | Temp 98.3°F | Wt >= 6400 oz

## 2019-02-11 DIAGNOSIS — O09292 Supervision of pregnancy with other poor reproductive or obstetric history, second trimester: Secondary | ICD-10-CM

## 2019-02-11 DIAGNOSIS — I10 Essential (primary) hypertension: Secondary | ICD-10-CM

## 2019-02-11 DIAGNOSIS — O09299 Supervision of pregnancy with other poor reproductive or obstetric history, unspecified trimester: Secondary | ICD-10-CM

## 2019-02-11 DIAGNOSIS — O30042 Twin pregnancy, dichorionic/diamniotic, second trimester: Secondary | ICD-10-CM | POA: Diagnosis not present

## 2019-02-11 DIAGNOSIS — O0992 Supervision of high risk pregnancy, unspecified, second trimester: Secondary | ICD-10-CM

## 2019-02-11 DIAGNOSIS — O10012 Pre-existing essential hypertension complicating pregnancy, second trimester: Secondary | ICD-10-CM

## 2019-02-11 DIAGNOSIS — O34219 Maternal care for unspecified type scar from previous cesarean delivery: Secondary | ICD-10-CM | POA: Diagnosis not present

## 2019-02-11 DIAGNOSIS — B009 Herpesviral infection, unspecified: Secondary | ICD-10-CM

## 2019-02-11 DIAGNOSIS — Z3A22 22 weeks gestation of pregnancy: Secondary | ICD-10-CM

## 2019-02-11 NOTE — Progress Notes (Signed)
Medicaid Home Form Completed-02/11/2019

## 2019-02-11 NOTE — Progress Notes (Signed)
Pt wants to see if she can be put on some type of restrictions at work, stands all day.

## 2019-02-11 NOTE — Progress Notes (Signed)
   PRENATAL VISIT NOTE  Subjective:  Krystal Hartman is a 28 y.o. [phone] at [redacted]w[redacted]d being seen today for ongoing prenatal care.  She is currently monitored for the following issues for this high-risk pregnancy and has Morbid obesity ([place]); Hidradenitis suppurativa; HSV-2 (herpes simplex virus 2) infection; Chronic hypertension; IUD complication ([place]); Supervision of high-risk pregnancy; Hx of preeclampsia, prior pregnancy, currently pregnant; Previous cesarean delivery affecting pregnancy, antepartum; and Dichorionic diamniotic twin gestation on their problem list.  Patient reports no complaints.  Contractions: Irritability. Vag. Bleeding: None.  Movement: Present. Denies leaking of fluid.   The following portions of the patient's history were reviewed and updated as appropriate: allergies, current medications, past family history, past medical history, past social history, past surgical history and problem list.   Objective:   Vitals:   02/11/19 0916  BP: 117/77  Pulse: 100  Temp: 98.3 F (36.8 C)  Weight: (!) 400 lb (181.4 kg)    Fetal Status:     Movement: Present     Bedside US: both twins seen, (A breech, B transverse), good movement and normal cardiac activity seen on both  General:  Alert, oriented and cooperative. Patient is in no acute distress.  Skin: Skin is warm and dry. No rash noted.   Cardiovascular: Normal heart rate noted  Respiratory: Normal respiratory effort, no problems with respiration noted  Abdomen: Soft, gravid, appropriate for gestational age.  Pain/Pressure: Present     Pelvic: Cervical exam deferred        Extremities: Normal range of motion.  Edema: None  Mental Status: Normal mood and affect. Normal behavior. Normal judgment and thought content.   Assessment and Plan:  Pregnancy: G5P3013 at [redacted]w[redacted]d  1. Supervision of high risk pregnancy in second trimester Doing well Reviewed options for contraception, she is unsure about BTL, discussing with her  husband  2. HSV-2 (herpes simplex virus 2) infection ppx 34 weeks  3. Morbid obesity ([place]) Has not had A1c, ordered today  4. Dichorionic diamniotic twin pregnancy in second trimester Discordance 9%  5. Previous cesarean delivery affecting pregnancy, antepartum H/o 3 x CS, will need 4th scheduled  6. Hx of preeclampsia, prior pregnancy, currently pregnancy  7. Chronic hypertension Cont amlodipine 10 mg Cont baby ASA  Preterm labor symptoms and general obstetric precautions including but not limited to vaginal bleeding, contractions, leaking of fluid and fetal movement were reviewed in detail with the patient. Please refer to After Visit Summary for other counseling recommendations.   Return in about 2 weeks (around 02/25/2019) for OB visit (MD), virtual.  Future Appointments  Date Time Provider [place]  02/25/2019 10:30 AM Lexa Finney MFC-US  02/25/2019 10:30 AM [place] 1 [facility]-MFCUS MFC-US    Sloan Leiter, MD

## 2019-02-12 ENCOUNTER — Telehealth: Payer: Self-pay | Admitting: *Deleted

## 2019-02-12 LAB — HEMOGLOBIN A1C
Est. average glucose Bld gHb Est-mCnc: 123 mg/dL
Hgb A1c MFr Bld: 5.9 % — ABNORMAL HIGH (ref 4.8–5.6)

## 2019-02-12 NOTE — Telephone Encounter (Signed)
-----   Message from Sloan Leiter, MD sent at 02/12/2019 11:12 AM EDT ----- Needs to come in ASAP for 2 hr GTT

## 2019-02-12 NOTE — Telephone Encounter (Signed)
I called Krystal Hartman and left a message I am calling to tell you we have scheduled you for a fasting lab appointment for 02/18/19 at 0850. I will send you a Mychart message with more details so please check your MyChart. If you have other questions- please message or call us.  Holten Spano,RN

## 2019-02-18 ENCOUNTER — Other Ambulatory Visit: Payer: Medicaid Other

## 2019-02-20 ENCOUNTER — Other Ambulatory Visit: Payer: Self-pay | Admitting: *Deleted

## 2019-02-20 DIAGNOSIS — O0992 Supervision of high risk pregnancy, unspecified, second trimester: Secondary | ICD-10-CM

## 2019-02-21 ENCOUNTER — Telehealth: Payer: Self-pay | Admitting: Family Medicine

## 2019-02-21 NOTE — Telephone Encounter (Signed)
Spoke to patient about her appointment on 9/8 @ 9:10. Patient instructed to wear a face mask for the entire appointment and no visitors are allowed with her during the visit. Patient screened for covid symptoms and denied having any

## 2019-02-25 ENCOUNTER — Other Ambulatory Visit (HOSPITAL_COMMUNITY): Payer: Self-pay | Admitting: *Deleted

## 2019-02-25 ENCOUNTER — Encounter (HOSPITAL_COMMUNITY): Payer: Self-pay

## 2019-02-25 ENCOUNTER — Other Ambulatory Visit: Payer: Self-pay

## 2019-02-25 ENCOUNTER — Ambulatory Visit (HOSPITAL_COMMUNITY)
Admission: RE | Admit: 2019-02-25 | Discharge: 2019-02-25 | Disposition: A | Discharge status: Home / Self Care | Payer: Medicaid Other | Source: Ambulatory Visit | Attending: Obstetrics and Gynecology | Admitting: Obstetrics and Gynecology

## 2019-02-25 ENCOUNTER — Other Ambulatory Visit: Payer: Medicaid Other

## 2019-02-25 ENCOUNTER — Ambulatory Visit (HOSPITAL_COMMUNITY): Payer: Medicaid Other | Admitting: *Deleted

## 2019-02-25 DIAGNOSIS — O0992 Supervision of high risk pregnancy, unspecified, second trimester: Secondary | ICD-10-CM | POA: Diagnosis not present

## 2019-02-25 DIAGNOSIS — O09299 Supervision of pregnancy with other poor reproductive or obstetric history, unspecified trimester: Secondary | ICD-10-CM | POA: Diagnosis present

## 2019-02-25 DIAGNOSIS — Z362 Encounter for other antenatal screening follow-up: Secondary | ICD-10-CM

## 2019-02-25 DIAGNOSIS — O34219 Maternal care for unspecified type scar from previous cesarean delivery: Secondary | ICD-10-CM

## 2019-02-25 DIAGNOSIS — O10012 Pre-existing essential hypertension complicating pregnancy, second trimester: Secondary | ICD-10-CM | POA: Diagnosis not present

## 2019-02-25 DIAGNOSIS — O99212 Obesity complicating pregnancy, second trimester: Secondary | ICD-10-CM | POA: Diagnosis not present

## 2019-02-25 DIAGNOSIS — O30042 Twin pregnancy, dichorionic/diamniotic, second trimester: Secondary | ICD-10-CM | POA: Diagnosis not present

## 2019-02-25 DIAGNOSIS — O09292 Supervision of pregnancy with other poor reproductive or obstetric history, second trimester: Secondary | ICD-10-CM | POA: Diagnosis not present

## 2019-02-25 DIAGNOSIS — O30043 Twin pregnancy, dichorionic/diamniotic, third trimester: Secondary | ICD-10-CM | POA: Diagnosis not present

## 2019-02-25 DIAGNOSIS — O30049 Twin pregnancy, dichorionic/diamniotic, unspecified trimester: Secondary | ICD-10-CM

## 2019-02-25 DIAGNOSIS — Z3A24 24 weeks gestation of pregnancy: Secondary | ICD-10-CM | POA: Diagnosis not present

## 2019-02-26 LAB — GLUCOSE TOLERANCE, 2 HOURS W/ 1HR
Glucose, 1 hour: 122 mg/dL (ref 65–179)
Glucose, 2 hour: 103 mg/dL (ref 65–152)
Glucose, Fasting: 80 mg/dL (ref 65–91)

## 2019-02-26 LAB — RPR: RPR Ser Ql: NONREACTIVE

## 2019-02-26 LAB — CBC
Hematocrit: 27.4 % — ABNORMAL LOW (ref 34.0–46.6)
Hemoglobin: 8.7 g/dL — ABNORMAL LOW (ref 11.1–15.9)
MCH: 24.9 pg — ABNORMAL LOW (ref 26.6–33.0)
MCHC: 31.8 g/dL (ref 31.5–35.7)
MCV: 79 fL (ref 79–97)
Platelets: 293 10*3/uL (ref 150–450)
RBC: 3.49 x10E6/uL — ABNORMAL LOW (ref 3.77–5.28)
RDW: 17.5 % — ABNORMAL HIGH (ref 11.7–15.4)
WBC: 6.8 10*3/uL (ref 3.4–10.8)

## 2019-02-26 LAB — HIV ANTIBODY (ROUTINE TESTING W REFLEX): HIV Screen 4th Generation wRfx: NONREACTIVE

## 2019-02-28 ENCOUNTER — Encounter: Payer: Self-pay | Admitting: *Deleted

## 2019-03-03 ENCOUNTER — Encounter: Payer: Self-pay | Admitting: Obstetrics and Gynecology

## 2019-03-03 ENCOUNTER — Telehealth (INDEPENDENT_AMBULATORY_CARE_PROVIDER_SITE_OTHER): Payer: Medicaid Other | Admitting: Lactation Services

## 2019-03-03 ENCOUNTER — Telehealth (INDEPENDENT_AMBULATORY_CARE_PROVIDER_SITE_OTHER): Payer: Medicaid Other | Admitting: Obstetrics and Gynecology

## 2019-03-03 ENCOUNTER — Other Ambulatory Visit: Payer: Self-pay

## 2019-03-03 ENCOUNTER — Ambulatory Visit: Payer: Medicaid Other | Admitting: Lactation Services

## 2019-03-03 VITALS — BP 127/81 | HR 98

## 2019-03-03 DIAGNOSIS — O10012 Pre-existing essential hypertension complicating pregnancy, second trimester: Secondary | ICD-10-CM

## 2019-03-03 DIAGNOSIS — O99012 Anemia complicating pregnancy, second trimester: Secondary | ICD-10-CM

## 2019-03-03 DIAGNOSIS — Z3A25 25 weeks gestation of pregnancy: Secondary | ICD-10-CM | POA: Diagnosis not present

## 2019-03-03 DIAGNOSIS — O30042 Twin pregnancy, dichorionic/diamniotic, second trimester: Secondary | ICD-10-CM

## 2019-03-03 DIAGNOSIS — O09292 Supervision of pregnancy with other poor reproductive or obstetric history, second trimester: Secondary | ICD-10-CM

## 2019-03-03 DIAGNOSIS — M79672 Pain in left foot: Secondary | ICD-10-CM | POA: Insufficient documentation

## 2019-03-03 DIAGNOSIS — O0992 Supervision of high risk pregnancy, unspecified, second trimester: Secondary | ICD-10-CM

## 2019-03-03 DIAGNOSIS — O09299 Supervision of pregnancy with other poor reproductive or obstetric history, unspecified trimester: Secondary | ICD-10-CM

## 2019-03-03 DIAGNOSIS — O34219 Maternal care for unspecified type scar from previous cesarean delivery: Secondary | ICD-10-CM | POA: Diagnosis not present

## 2019-03-03 DIAGNOSIS — B009 Herpesviral infection, unspecified: Secondary | ICD-10-CM

## 2019-03-03 DIAGNOSIS — I10 Essential (primary) hypertension: Secondary | ICD-10-CM

## 2019-03-03 MED ORDER — AMLODIPINE BESYLATE 10 MG PO TABS: 10.0000 mg | ORAL_TABLET | Freq: Every day | ORAL | 1 refills | Status: DC

## 2019-03-03 NOTE — Telephone Encounter (Signed)
Called pt in to let her know about her appt today at 2:30 pm. She is to bring her BP cuff to figure out about if it is working well and to make sure [employer] is working well for her. Pt voiced understanding.

## 2019-03-03 NOTE — Progress Notes (Addendum)
Pt reports she has been having dizziness and was told today she needs to have iron infusions. Dizziness occurs when she is hot or she gets up quickly. She reports she feels much better with sitting. 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 Hgb 8.7. Called short stay to order Iron Infusion and was not able to reach someone, will attempt again and call pt with appt time.   Pt reports pain to the bottom of her foot mostly near the heel and the middle of the foot.  She reports it is painful to walk and go up steps. She has no redness or warmth, calf not involved. Pt does not remember hurting herself. Pt does have some mild non pitting edema to the right ankle. Left ankle without swelling. Discussed with Dr. Rip Harbour who does not feel like pts symptoms consistent with DVT. Pt was was informed to elevate foot when not working and to apply ice and/or heat to see which one helps. Discussed she can try Tylenol, cautioned against Ibuprofen with pts hemoglobin. Pt was informed that if he calf os swelling, or has redness or warmth to the area, she needs to report to the MAU at the [facility] and [place] for evaluation. Pt voiced understanding.   Pt has BP cuff and was having difficulty with persistent reading on her lower arm and holding over her heart. BP today was 137/86 in the office with her BP cuff.    4:20 PM- Called short stay again with no answer.

## 2019-03-03 NOTE — Progress Notes (Signed)
Patient needs refill on Norvasc

## 2019-03-03 NOTE — Progress Notes (Signed)
I connected with  Krystal Hartman on 03/03/19 at  9:15 AM EDT by telephone and verified that I am speaking with the correct person using two identifiers.   I discussed the limitations, risks, security and privacy concerns of performing an evaluation and management service by telephone and the availability of in person appointments. I also discussed with the patient that there may be a patient responsible charge related to this service. The patient expressed understanding and agreed to proceed.  Derinda Late, RN 03/03/2019  8:42 AM

## 2019-03-03 NOTE — Progress Notes (Signed)
TELEHEALTH VIRTUAL OBSTETRICS VISIT ENCOUNTER NOTE  Clinic: [facility]  I connected with Krystal ChadDabrisha Y Aitken on 03/03/19 at  9:15 AM EDT by telephone at home and verified that I am speaking with the correct person using two identifiers.   I discussed the limitations, risks, security and privacy concerns of performing an evaluation and management service by telephone and the availability of in person appointments. I also discussed with the patient that there may be a patient responsible charge related to this service. The patient expressed understanding and agreed to proceed.  Subjective:  Krystal Hartman is a 28 y.o. [phone] at 5747w3d being followed for ongoing prenatal care.  She is currently monitored for the following issues for this high-risk pregnancy and has Morbid obesity ([facility]); Hidradenitis suppurativa; HSV-2 (herpes simplex virus 2) infection; Chronic hypertension; IUD complication ([facility]); Supervision of high-risk pregnancy; Hx of preeclampsia, prior pregnancy, currently pregnant; Previous cesarean delivery affecting pregnancy, antepartum; and Dichorionic diamniotic twin gestation on their problem list.  Patient reports pain in left foot since Sunday. ?bigger than right, hard to stand on. Both feet are swollen. No redness or feels hot.   Reports fetal movement. Denies any contractions, bleeding or leaking of fluid.   The following portions of the patient's history were reviewed and updated as appropriate: allergies, current medications, past family history, past medical history, past social history, past surgical history and problem list.   Objective:   Vitals:   03/03/19 0847  BP: 127/81  Pulse: 98    Babyscripts Data Reviewed: yes  General:  Alert, oriented and cooperative.   Mental Status: Normal mood and affect perceived. Normal judgment and thought content.  Rest of physical exam deferred due to type of encounter  Assessment and Plan:   Pregnancy: G5P3013 at 3247w3d 1. Supervision of high risk pregnancy in second trimester Routine care. 28wk labs last week negative except for anemia (see below) Doesn't want BTL Having issues with babyscripts app; pt has cuff. See below  2. Hx of preeclampsia, prior pregnancy, currently pregnant Continue low dose asa  3. Chronic hypertension Refill sent in.  Start ap testing at 32wks Normal growth last week - amLODipine (NORVASC) 10 MG tablet; Take 1 tablet (10 mg total) by mouth daily.  Dispense: 60 tablet; Refill: 1  4. Morbid obesity (HCC) Weight stable  5. HSV-2 (herpes simplex virus 2) infection ppx at 32-34wks  6. Previous cesarean delivery affecting pregnancy, antepartum Set up rpt c-section around 28wks  7. Dichorionic diamniotic twin pregnancy in second trimester Normal growth last week  8. Anemia during pregnancy in second trimester Pt okay with IV iron infusion  9. Pain in left foot Hard to say MyChart but suscipion for DVT is low but will have her come in for nurse visit to look at and to help with babyscripts app  Preterm labor symptoms and general obstetric precautions including but not limited to vaginal bleeding, contractions, leaking of fluid and fetal movement were reviewed in detail with the patient.  I discussed the assessment and treatment plan with the patient. The patient was provided an opportunity to ask questions and all were answered. The patient agreed with the plan and demonstrated an understanding of the instructions. The patient was advised to call back or seek an in-person office evaluation/[facility] for any urgent or concerning symptoms. Please refer to After Visit Summary for other counseling recommendations.   I provided 10 minutes of non-face-to-face time during this  encounter. The visit was conducted via MyChart-medicine  Return in about 3 weeks (around 03/24/2019) for high risk, in person, same day as u/s  visit.  Future Appointments  Date Time Provider [place]  03/03/2019  9:15 AM Aletha Halim, MD [facility]  03/24/2019  1:10 PM [place] NURSE [place] MFC-US  03/24/2019  1:15 PM WH-MFC [place] 4 WH-MFCUS MFC-US    Nethan Caudillo, [place] for [facility], [place]

## 2019-03-04 ENCOUNTER — Telehealth: Payer: Self-pay | Admitting: Lactation Services

## 2019-03-04 ENCOUNTER — Telehealth (HOSPITAL_COMMUNITY): Payer: Self-pay | Admitting: Lactation Services

## 2019-03-04 DIAGNOSIS — O99012 Anemia complicating pregnancy, second trimester: Secondary | ICD-10-CM

## 2019-03-04 NOTE — Telephone Encounter (Signed)
Attempted to call Short Stay to schedule Iron infusion. Was not able to reach them, LM for them to call the clinic to get pt scheduled.

## 2019-03-04 NOTE — Telephone Encounter (Signed)
Was able to schedule pts Iron infusion for 9/21 at 12 noon.   Pt was called and informed of date, time and location of infusion. Pt voiced understanding. She is aware to go to [facility] Entrance B and enter through admitting. Pt aware second infusion will be scheduled after 1st infusion completed.   Pt with no questions or concerns.

## 2019-03-10 ENCOUNTER — Other Ambulatory Visit: Payer: Self-pay

## 2019-03-10 ENCOUNTER — Ambulatory Visit (HOSPITAL_COMMUNITY)
Admission: RE | Admit: 2019-03-10 | Discharge: 2019-03-10 | Disposition: A | Discharge status: Home / Self Care | Payer: Medicaid Other | Source: Ambulatory Visit | Attending: Obstetrics and Gynecology | Admitting: Obstetrics and Gynecology

## 2019-03-10 DIAGNOSIS — O99012 Anemia complicating pregnancy, second trimester: Secondary | ICD-10-CM | POA: Diagnosis not present

## 2019-03-10 MED ORDER — SODIUM CHLORIDE 0.9 % IV SOLN
510.0000 mg | INTRAVENOUS | Status: DC
  Administered 2019-03-10: 510 mg via INTRAVENOUS
  Filled 2019-03-10: qty 17

## 2019-03-10 NOTE — Discharge Instructions (Signed)

## 2019-03-17 ENCOUNTER — Other Ambulatory Visit: Payer: Self-pay

## 2019-03-17 ENCOUNTER — Encounter (HOSPITAL_COMMUNITY)
Admission: RE | Admit: 2019-03-17 | Discharge: 2019-03-17 | Disposition: A | Discharge status: Home / Self Care | Payer: Medicaid Other | Source: Ambulatory Visit | Attending: Obstetrics and Gynecology | Admitting: Obstetrics and Gynecology

## 2019-03-17 DIAGNOSIS — O99013 Anemia complicating pregnancy, third trimester: Secondary | ICD-10-CM | POA: Insufficient documentation

## 2019-03-17 MED ORDER — SODIUM CHLORIDE 0.9 % IV SOLN
510.0000 mg | INTRAVENOUS | Status: AC
  Administered 2019-03-17: 510 mg via INTRAVENOUS
  Filled 2019-03-17: qty 510

## 2019-03-24 ENCOUNTER — Other Ambulatory Visit: Payer: Self-pay

## 2019-03-24 ENCOUNTER — Ambulatory Visit (INDEPENDENT_AMBULATORY_CARE_PROVIDER_SITE_OTHER): Payer: Medicaid Other | Admitting: Family Medicine

## 2019-03-24 ENCOUNTER — Ambulatory Visit (HOSPITAL_COMMUNITY)
Admission: RE | Admit: 2019-03-24 | Discharge: 2019-03-24 | Disposition: A | Discharge status: Home / Self Care | Payer: Medicaid Other | Source: Ambulatory Visit | Attending: Obstetrics and Gynecology | Admitting: Obstetrics and Gynecology

## 2019-03-24 ENCOUNTER — Other Ambulatory Visit (HOSPITAL_COMMUNITY): Payer: Self-pay | Admitting: Obstetrics and Gynecology

## 2019-03-24 ENCOUNTER — Encounter (HOSPITAL_COMMUNITY): Payer: Self-pay

## 2019-03-24 ENCOUNTER — Encounter: Payer: Self-pay | Admitting: Family Medicine

## 2019-03-24 ENCOUNTER — Ambulatory Visit (HOSPITAL_COMMUNITY): Payer: Medicaid Other | Admitting: *Deleted

## 2019-03-24 VITALS — BP 135/84 | HR 105 | Wt >= 6400 oz

## 2019-03-24 DIAGNOSIS — Z3A28 28 weeks gestation of pregnancy: Secondary | ICD-10-CM

## 2019-03-24 DIAGNOSIS — O09299 Supervision of pregnancy with other poor reproductive or obstetric history, unspecified trimester: Secondary | ICD-10-CM | POA: Diagnosis present

## 2019-03-24 DIAGNOSIS — O34219 Maternal care for unspecified type scar from previous cesarean delivery: Secondary | ICD-10-CM

## 2019-03-24 DIAGNOSIS — O30049 Twin pregnancy, dichorionic/diamniotic, unspecified trimester: Secondary | ICD-10-CM | POA: Diagnosis present

## 2019-03-24 DIAGNOSIS — O0992 Supervision of high risk pregnancy, unspecified, second trimester: Secondary | ICD-10-CM

## 2019-03-24 DIAGNOSIS — O09293 Supervision of pregnancy with other poor reproductive or obstetric history, third trimester: Secondary | ICD-10-CM

## 2019-03-24 DIAGNOSIS — O99213 Obesity complicating pregnancy, third trimester: Secondary | ICD-10-CM

## 2019-03-24 DIAGNOSIS — O98513 Other viral diseases complicating pregnancy, third trimester: Secondary | ICD-10-CM

## 2019-03-24 DIAGNOSIS — B009 Herpesviral infection, unspecified: Secondary | ICD-10-CM

## 2019-03-24 DIAGNOSIS — Z362 Encounter for other antenatal screening follow-up: Secondary | ICD-10-CM

## 2019-03-24 DIAGNOSIS — O30043 Twin pregnancy, dichorionic/diamniotic, third trimester: Secondary | ICD-10-CM

## 2019-03-24 DIAGNOSIS — O30042 Twin pregnancy, dichorionic/diamniotic, second trimester: Secondary | ICD-10-CM

## 2019-03-24 DIAGNOSIS — O0993 Supervision of high risk pregnancy, unspecified, third trimester: Secondary | ICD-10-CM

## 2019-03-24 DIAGNOSIS — O10013 Pre-existing essential hypertension complicating pregnancy, third trimester: Secondary | ICD-10-CM

## 2019-03-24 NOTE — Progress Notes (Signed)
   PRENATAL VISIT NOTE  Subjective:  Krystal Hartman is a 28 y.o. [phone] at [redacted]w[redacted]d being seen today for ongoing prenatal care.  She is currently monitored for the following issues for this high-risk pregnancy and has Morbid obesity ([place]); Hidradenitis suppurativa; HSV-2 (herpes simplex virus 2) infection; Chronic hypertension; Anemia during pregnancy in second trimester; IUD complication ([place]); Supervision of high-risk pregnancy; Hx of preeclampsia, prior pregnancy, currently pregnant; Previous cesarean delivery affecting pregnancy, antepartum; Dichorionic diamniotic twin gestation; and Pain in left foot on their problem list.  Patient reports no complaints.  Contractions: Not present. Vag. Bleeding: None.  Movement: Present. Denies leaking of fluid.   The following portions of the patient's history were reviewed and updated as appropriate: allergies, current medications, past family history, past medical history, past social history, past surgical history and problem list.   Objective:   Vitals:   03/24/19 1434 03/24/19 1528  BP: (!) 142/85 135/84  Pulse: (!) 105   Weight: (!) 422 lb 11.2 oz (191.7 kg)     Fetal Status: Fetal Heart Rate (bpm): 154/157   Movement: Present   Unable to obtain FH due to body habitus.   General:  Alert, oriented and cooperative. Patient is in no acute distress.  Skin: Skin is warm and dry. No rash noted.   Cardiovascular: Mild tachycardia   Respiratory: Normal respiratory effort, no problems with respiration noted  Abdomen: Soft, gravid, appropriate for gestational age.  Pain/Pressure: Present     Pelvic: Cervical exam deferred        Extremities: Normal range of motion.  Edema: None  Mental Status: Normal mood and affect. Normal behavior. Normal judgment and thought content.   Assessment and Plan:  Pregnancy: [id number] at [redacted]w[redacted]d 1. Supervision of high risk pregnancy in second trimester Routine care Doesn't want BTL; doesn't want IUD; possibly Depo    2. Hx of preeclampsia, prior pregnancy, currently pregnant Continue low dose asa  3. Chronic hypertension Start ap testing at 32wks Cont Norvasc 10 mg   4. Morbid obesity ([place]) Weight stable  5. HSV-2 (herpes simplex virus 2) infection ppx due at 32-34wks  6. Previous cesarean delivery affecting pregnancy, antepartum Sent message to scheduling to request 38wk repeat CS  7. Dichorionic diamniotic twin pregnancy in second trimester Discordance 6% on 9/8; f/u growth today   8. Anemia during pregnancy in second trimester Has received iron x2  Preterm labor symptoms and general obstetric precautions including but not limited to vaginal bleeding, contractions, leaking of fluid and fetal movement were reviewed in detail with the patient. Please refer to After Visit Summary for other counseling recommendations.   Return in about 4 weeks (around 04/21/2019) for [facility]; in-person .  Future Appointments  Date Time Provider [place]  04/21/2019 11:15 AM Chancy Milroy, MD [facility]    Chauncey Mann, MD

## 2019-03-25 ENCOUNTER — Other Ambulatory Visit (HOSPITAL_COMMUNITY): Payer: Self-pay | Admitting: *Deleted

## 2019-03-25 DIAGNOSIS — O30043 Twin pregnancy, dichorionic/diamniotic, third trimester: Secondary | ICD-10-CM

## 2019-03-25 DIAGNOSIS — O99213 Obesity complicating pregnancy, third trimester: Secondary | ICD-10-CM

## 2019-04-21 ENCOUNTER — Ambulatory Visit (HOSPITAL_COMMUNITY)
Admission: RE | Admit: 2019-04-21 | Discharge: 2019-04-21 | Disposition: A | Discharge status: Home / Self Care | Payer: Medicaid Other | Source: Ambulatory Visit | Attending: Obstetrics and Gynecology | Admitting: Obstetrics and Gynecology

## 2019-04-21 ENCOUNTER — Other Ambulatory Visit: Payer: Self-pay

## 2019-04-21 ENCOUNTER — Ambulatory Visit (HOSPITAL_COMMUNITY): Payer: Medicaid Other | Admitting: *Deleted

## 2019-04-21 ENCOUNTER — Encounter (HOSPITAL_COMMUNITY): Payer: Self-pay | Admitting: *Deleted

## 2019-04-21 ENCOUNTER — Encounter: Payer: Medicaid Other | Admitting: Obstetrics and Gynecology

## 2019-04-21 DIAGNOSIS — Z362 Encounter for other antenatal screening follow-up: Secondary | ICD-10-CM | POA: Diagnosis not present

## 2019-04-21 DIAGNOSIS — Z3A32 32 weeks gestation of pregnancy: Secondary | ICD-10-CM | POA: Diagnosis not present

## 2019-04-21 DIAGNOSIS — O09293 Supervision of pregnancy with other poor reproductive or obstetric history, third trimester: Secondary | ICD-10-CM | POA: Diagnosis not present

## 2019-04-21 DIAGNOSIS — O10013 Pre-existing essential hypertension complicating pregnancy, third trimester: Secondary | ICD-10-CM | POA: Diagnosis not present

## 2019-04-21 DIAGNOSIS — O34219 Maternal care for unspecified type scar from previous cesarean delivery: Secondary | ICD-10-CM

## 2019-04-21 DIAGNOSIS — O30043 Twin pregnancy, dichorionic/diamniotic, third trimester: Secondary | ICD-10-CM | POA: Diagnosis not present

## 2019-04-21 DIAGNOSIS — O99213 Obesity complicating pregnancy, third trimester: Secondary | ICD-10-CM | POA: Diagnosis not present

## 2019-04-21 DIAGNOSIS — O09299 Supervision of pregnancy with other poor reproductive or obstetric history, unspecified trimester: Secondary | ICD-10-CM | POA: Diagnosis present

## 2019-04-21 DIAGNOSIS — O0992 Supervision of high risk pregnancy, unspecified, second trimester: Secondary | ICD-10-CM

## 2019-04-28 ENCOUNTER — Encounter (HOSPITAL_COMMUNITY): Payer: Self-pay

## 2019-04-28 ENCOUNTER — Ambulatory Visit (HOSPITAL_COMMUNITY)
Admission: RE | Admit: 2019-04-28 | Discharge: 2019-04-28 | Disposition: A | Discharge status: Home / Self Care | Payer: Medicaid Other | Source: Ambulatory Visit | Attending: Obstetrics and Gynecology | Admitting: Obstetrics and Gynecology

## 2019-04-28 ENCOUNTER — Ambulatory Visit (HOSPITAL_COMMUNITY): Payer: Medicaid Other | Admitting: *Deleted

## 2019-04-28 ENCOUNTER — Other Ambulatory Visit: Payer: Self-pay

## 2019-04-28 ENCOUNTER — Other Ambulatory Visit (HOSPITAL_COMMUNITY): Payer: Self-pay | Admitting: *Deleted

## 2019-04-28 DIAGNOSIS — O09299 Supervision of pregnancy with other poor reproductive or obstetric history, unspecified trimester: Secondary | ICD-10-CM | POA: Insufficient documentation

## 2019-04-28 DIAGNOSIS — Z3A33 33 weeks gestation of pregnancy: Secondary | ICD-10-CM

## 2019-04-28 DIAGNOSIS — O0993 Supervision of high risk pregnancy, unspecified, third trimester: Secondary | ICD-10-CM

## 2019-04-28 DIAGNOSIS — O09293 Supervision of pregnancy with other poor reproductive or obstetric history, third trimester: Secondary | ICD-10-CM | POA: Diagnosis not present

## 2019-04-28 DIAGNOSIS — O10013 Pre-existing essential hypertension complicating pregnancy, third trimester: Secondary | ICD-10-CM

## 2019-04-28 DIAGNOSIS — O99213 Obesity complicating pregnancy, third trimester: Secondary | ICD-10-CM | POA: Diagnosis not present

## 2019-04-28 DIAGNOSIS — O34219 Maternal care for unspecified type scar from previous cesarean delivery: Secondary | ICD-10-CM | POA: Diagnosis not present

## 2019-04-28 DIAGNOSIS — O30043 Twin pregnancy, dichorionic/diamniotic, third trimester: Secondary | ICD-10-CM

## 2019-05-01 ENCOUNTER — Other Ambulatory Visit: Payer: Self-pay

## 2019-05-01 ENCOUNTER — Encounter: Payer: Self-pay | Admitting: Family Medicine

## 2019-05-01 ENCOUNTER — Ambulatory Visit (INDEPENDENT_AMBULATORY_CARE_PROVIDER_SITE_OTHER): Payer: Medicaid Other | Admitting: Family Medicine

## 2019-05-01 VITALS — BP 130/94 | HR 102 | Wt >= 6400 oz

## 2019-05-01 DIAGNOSIS — O0993 Supervision of high risk pregnancy, unspecified, third trimester: Secondary | ICD-10-CM | POA: Diagnosis not present

## 2019-05-01 DIAGNOSIS — O34219 Maternal care for unspecified type scar from previous cesarean delivery: Secondary | ICD-10-CM

## 2019-05-01 DIAGNOSIS — O30043 Twin pregnancy, dichorionic/diamniotic, third trimester: Secondary | ICD-10-CM | POA: Diagnosis not present

## 2019-05-01 DIAGNOSIS — Z3A33 33 weeks gestation of pregnancy: Secondary | ICD-10-CM

## 2019-05-01 DIAGNOSIS — Z23 Encounter for immunization: Secondary | ICD-10-CM

## 2019-05-01 DIAGNOSIS — I10 Essential (primary) hypertension: Secondary | ICD-10-CM

## 2019-05-01 NOTE — Patient Instructions (Signed)

## 2019-05-01 NOTE — Progress Notes (Signed)
   PRENATAL VISIT NOTE  Subjective:  Krystal Hartman is a 28 y.o. [phone] at [redacted]w[redacted]d being seen today for ongoing prenatal care.  She is currently monitored for the following issues for this low-risk pregnancy and has Morbid obesity ([place]); Hidradenitis suppurativa; HSV-2 (herpes simplex virus 2) infection; Chronic hypertension; Anemia during pregnancy in second trimester; IUD complication ([place]); Supervision of high-risk pregnancy; Hx of preeclampsia, prior pregnancy, currently pregnant; Previous cesarean delivery affecting pregnancy, antepartum; Dichorionic diamniotic twin gestation; and Pain in left foot on their problem list.  Patient reports no complaints.  Contractions: Irregular. Vag. Bleeding: None.  Movement: Present. Denies leaking of fluid.   The following portions of the patient's history were reviewed and updated as appropriate: allergies, current medications, past family history, past medical history, past social history, past surgical history and problem list.   Objective:   Vitals:   05/01/19 1012  BP: (!) 130/94  Pulse: (!) 102  Weight: (!) 443 lb 6.4 oz (201.1 kg)    Fetal Status: Fetal Heart Rate (bpm): + x 2   Movement: Present     General:  Alert, oriented and cooperative. Patient is in no acute distress.  Skin: Skin is warm and dry. No rash noted.   Cardiovascular: Normal heart rate noted  Respiratory: Normal respiratory effort, no problems with respiration noted  Abdomen: Soft, gravid, appropriate for gestational age.  Pain/Pressure: Present     Pelvic: Cervical exam deferred        Extremities: Normal range of motion.  Edema: Trace  Mental Status: Normal mood and affect. Normal behavior. Normal judgment and thought content.   Assessment and Plan:  Pregnancy: [id number] at [redacted]w[redacted]d 1. Chronic hypertension BP is stable on Norvasc On ASA Testing per MFM  2. Supervision of high risk pregnancy in third trimester Continue prenatal care.   3. Previous cesarean  delivery affecting pregnancy, antepartum Per last MFM u/s move C-section to 12/4 at 37 wks with 3 prior C-sections and DC/DA twins  4. Dichorionic diamniotic twin pregnancy in third trimester Concordant growth  Preterm labor symptoms and general obstetric precautions including but not limited to vaginal bleeding, contractions, leaking of fluid and fetal movement were reviewed in detail with the patient. Please refer to After Visit Summary for other counseling recommendations.   Return in 2 weeks (on 05/15/2019).  Future Appointments  Date Time Provider [place]  05/05/2019  9:15 AM [place] NURSE [place] [facility]-US  05/05/2019  9:15 AM [place] 4 WH-MFCUS MFC-US  05/12/2019  8:45 AM WH-MFC NURSE WH-MFC MFC-US  05/12/2019  8:45 AM WH-MFC [place] 5 WH-MFCUS MFC-US  05/19/2019  9:45 AM WH-MFC [place] 5 WH-MFCUS MFC-US  05/19/2019  9:50 AM WH-MFC NURSE WH-MFC MFC-US  05/20/2019  9:15 AM Sloan Leiter, MD [facility] WOC    Donnamae Jude, MD

## 2019-05-05 ENCOUNTER — Ambulatory Visit (HOSPITAL_COMMUNITY): Payer: Medicaid Other | Admitting: *Deleted

## 2019-05-05 ENCOUNTER — Encounter (HOSPITAL_COMMUNITY): Payer: Self-pay

## 2019-05-05 ENCOUNTER — Inpatient Hospital Stay (HOSPITAL_COMMUNITY)
Admission: AD | Admit: 2019-05-05 | Discharge: 2019-05-09 | DRG: 788 | Disposition: A | Discharge status: Home / Self Care | Payer: Medicaid Other | Attending: Obstetrics & Gynecology | Admitting: Obstetrics & Gynecology

## 2019-05-05 ENCOUNTER — Other Ambulatory Visit: Payer: Self-pay

## 2019-05-05 ENCOUNTER — Ambulatory Visit (HOSPITAL_COMMUNITY)
Admission: RE | Admit: 2019-05-05 | Discharge: 2019-05-05 | Disposition: A | Discharge status: Home / Self Care | Payer: Medicaid Other | Source: Ambulatory Visit | Attending: Obstetrics and Gynecology | Admitting: Obstetrics and Gynecology

## 2019-05-05 DIAGNOSIS — O09299 Supervision of pregnancy with other poor reproductive or obstetric history, unspecified trimester: Secondary | ICD-10-CM

## 2019-05-05 DIAGNOSIS — Z3A34 34 weeks gestation of pregnancy: Secondary | ICD-10-CM | POA: Diagnosis not present

## 2019-05-05 DIAGNOSIS — O30043 Twin pregnancy, dichorionic/diamniotic, third trimester: Secondary | ICD-10-CM | POA: Insufficient documentation

## 2019-05-05 DIAGNOSIS — O34219 Maternal care for unspecified type scar from previous cesarean delivery: Secondary | ICD-10-CM | POA: Diagnosis present

## 2019-05-05 DIAGNOSIS — O09293 Supervision of pregnancy with other poor reproductive or obstetric history, third trimester: Secondary | ICD-10-CM | POA: Diagnosis not present

## 2019-05-05 DIAGNOSIS — O403XX0 Polyhydramnios, third trimester, not applicable or unspecified: Secondary | ICD-10-CM | POA: Diagnosis present

## 2019-05-05 DIAGNOSIS — O403XX1 Polyhydramnios, third trimester, fetus 1: Secondary | ICD-10-CM | POA: Diagnosis not present

## 2019-05-05 DIAGNOSIS — O322XX2 Maternal care for transverse and oblique lie, fetus 2: Secondary | ICD-10-CM | POA: Diagnosis not present

## 2019-05-05 DIAGNOSIS — O30049 Twin pregnancy, dichorionic/diamniotic, unspecified trimester: Secondary | ICD-10-CM | POA: Diagnosis present

## 2019-05-05 DIAGNOSIS — O99210 Obesity complicating pregnancy, unspecified trimester: Secondary | ICD-10-CM

## 2019-05-05 DIAGNOSIS — O1002 Pre-existing essential hypertension complicating childbirth: Secondary | ICD-10-CM | POA: Diagnosis present

## 2019-05-05 DIAGNOSIS — Z7982 Long term (current) use of aspirin: Secondary | ICD-10-CM | POA: Diagnosis not present

## 2019-05-05 DIAGNOSIS — O99214 Obesity complicating childbirth: Secondary | ICD-10-CM | POA: Diagnosis present

## 2019-05-05 DIAGNOSIS — O114 Pre-existing hypertension with pre-eclampsia, complicating childbirth: Secondary | ICD-10-CM | POA: Diagnosis not present

## 2019-05-05 DIAGNOSIS — O34211 Maternal care for low transverse scar from previous cesarean delivery: Secondary | ICD-10-CM | POA: Diagnosis not present

## 2019-05-05 DIAGNOSIS — O99213 Obesity complicating pregnancy, third trimester: Secondary | ICD-10-CM

## 2019-05-05 DIAGNOSIS — Z8614 Personal history of Methicillin resistant Staphylococcus aureus infection: Secondary | ICD-10-CM | POA: Diagnosis not present

## 2019-05-05 DIAGNOSIS — O321XX2 Maternal care for breech presentation, fetus 2: Secondary | ICD-10-CM | POA: Diagnosis present

## 2019-05-05 DIAGNOSIS — O113 Pre-existing hypertension with pre-eclampsia, third trimester: Secondary | ICD-10-CM

## 2019-05-05 DIAGNOSIS — Z20828 Contact with and (suspected) exposure to other viral communicable diseases: Secondary | ICD-10-CM | POA: Diagnosis not present

## 2019-05-05 DIAGNOSIS — D649 Anemia, unspecified: Secondary | ICD-10-CM | POA: Diagnosis present

## 2019-05-05 DIAGNOSIS — O0993 Supervision of high risk pregnancy, unspecified, third trimester: Secondary | ICD-10-CM

## 2019-05-05 DIAGNOSIS — O403XX2 Polyhydramnios, third trimester, fetus 2: Secondary | ICD-10-CM | POA: Diagnosis not present

## 2019-05-05 DIAGNOSIS — O1413 Severe pre-eclampsia, third trimester: Secondary | ICD-10-CM | POA: Diagnosis not present

## 2019-05-05 DIAGNOSIS — O10013 Pre-existing essential hypertension complicating pregnancy, third trimester: Secondary | ICD-10-CM

## 2019-05-05 DIAGNOSIS — O321XX1 Maternal care for breech presentation, fetus 1: Secondary | ICD-10-CM | POA: Diagnosis present

## 2019-05-05 DIAGNOSIS — O1092 Unspecified pre-existing hypertension complicating childbirth: Secondary | ICD-10-CM | POA: Diagnosis present

## 2019-05-05 DIAGNOSIS — O119 Pre-existing hypertension with pre-eclampsia, unspecified trimester: Secondary | ICD-10-CM

## 2019-05-05 DIAGNOSIS — O99013 Anemia complicating pregnancy, third trimester: Secondary | ICD-10-CM | POA: Diagnosis present

## 2019-05-05 DIAGNOSIS — O9902 Anemia complicating childbirth: Secondary | ICD-10-CM | POA: Diagnosis not present

## 2019-05-05 DIAGNOSIS — O0990 Supervision of high risk pregnancy, unspecified, unspecified trimester: Secondary | ICD-10-CM

## 2019-05-05 DIAGNOSIS — O328XX1 Maternal care for other malpresentation of fetus, fetus 1: Secondary | ICD-10-CM | POA: Diagnosis not present

## 2019-05-05 DIAGNOSIS — Z98891 History of uterine scar from previous surgery: Secondary | ICD-10-CM

## 2019-05-05 DIAGNOSIS — O328XX2 Maternal care for other malpresentation of fetus, fetus 2: Secondary | ICD-10-CM | POA: Diagnosis not present

## 2019-05-05 LAB — URINALYSIS, ROUTINE W REFLEX MICROSCOPIC
Bilirubin Urine: NEGATIVE
Glucose, UA: NEGATIVE mg/dL
Hgb urine dipstick: NEGATIVE
Ketones, ur: NEGATIVE mg/dL
Nitrite: NEGATIVE
Protein, ur: 100 mg/dL — AB
Specific Gravity, Urine: 1.029 (ref 1.005–1.030)
pH: 5 (ref 5.0–8.0)

## 2019-05-05 LAB — CBC
HCT: 32.3 % — ABNORMAL LOW (ref 36.0–46.0)
Hemoglobin: 10.7 g/dL — ABNORMAL LOW (ref 12.0–15.0)
MCH: 27.5 pg (ref 26.0–34.0)
MCHC: 33.1 g/dL (ref 30.0–36.0)
MCV: 83 fL (ref 80.0–100.0)
Platelets: 228 10*3/uL (ref 150–400)
RBC: 3.89 MIL/uL (ref 3.87–5.11)
RDW: 18.1 % — ABNORMAL HIGH (ref 11.5–15.5)
WBC: 8 10*3/uL (ref 4.0–10.5)
nRBC: 0 % (ref 0.0–0.2)

## 2019-05-05 LAB — COMPREHENSIVE METABOLIC PANEL
ALT: 12 U/L (ref 0–44)
AST: 17 U/L (ref 15–41)
Albumin: 2.6 g/dL — ABNORMAL LOW (ref 3.5–5.0)
Alkaline Phosphatase: 83 U/L (ref 38–126)
Anion gap: 9 (ref 5–15)
BUN: 6 mg/dL (ref 6–20)
CO2: 18 mmol/L — ABNORMAL LOW (ref 22–32)
Calcium: 8.8 mg/dL — ABNORMAL LOW (ref 8.9–10.3)
Chloride: 110 mmol/L (ref 98–111)
Creatinine, Ser: 0.66 mg/dL (ref 0.44–1.00)
GFR calc Af Amer: >60 mL/min (ref >60)
GFR calc non Af Amer: >60 mL/min (ref >60)
Glucose, Bld: 84 mg/dL (ref 70–99)
Potassium: 3.9 mmol/L (ref 3.5–5.1)
Sodium: 137 mmol/L (ref 135–145)
Total Bilirubin: 0.2 mg/dL — ABNORMAL LOW (ref 0.3–1.2)
Total Protein: 7.3 g/dL (ref 6.5–8.1)

## 2019-05-05 LAB — PROTEIN / CREATININE RATIO, URINE
Creatinine, Urine: 359.5 mg/dL
Protein Creatinine Ratio: 0.22 mg/mg{Cre} — ABNORMAL HIGH (ref 0.00–0.15)
Total Protein, Urine: 79 mg/dL

## 2019-05-05 LAB — PREPARE RBC (CROSSMATCH)

## 2019-05-05 LAB — SARS CORONAVIRUS 2 BY RT PCR (HOSPITAL ORDER, PERFORMED IN ~~LOC~~ HOSPITAL LAB): SARS Coronavirus 2: NEGATIVE

## 2019-05-05 MED ORDER — BETAMETHASONE SOD PHOS & ACET 6 (3-3) MG/ML IJ SUSP
12.0000 mg | Freq: Once | INTRAMUSCULAR | Status: DC
  Filled 2019-05-05: qty 5

## 2019-05-05 MED ORDER — ACETAMINOPHEN 325 MG PO TABS: 650.0000 mg | ORAL_TABLET | ORAL | Status: DC | PRN

## 2019-05-05 MED ORDER — ZOLPIDEM TARTRATE 5 MG PO TABS: 5.0000 mg | ORAL_TABLET | Freq: Every evening | ORAL | Status: DC | PRN

## 2019-05-05 MED ORDER — LACTATED RINGERS IV SOLN: INTRAVENOUS | Status: DC | PRN

## 2019-05-05 MED ORDER — MAGNESIUM SULFATE BOLUS VIA INFUSION
4.0000 g | Freq: Once | INTRAVENOUS | Status: AC
  Administered 2019-05-05: 4 g via INTRAVENOUS
  Filled 2019-05-05: qty 1000

## 2019-05-05 MED ORDER — CALCIUM CARBONATE ANTACID 500 MG PO CHEW: 2.0000 | CHEWABLE_TABLET | ORAL | Status: DC | PRN

## 2019-05-05 MED ORDER — BETAMETHASONE SOD PHOS & ACET 6 (3-3) MG/ML IJ SUSP
12.0000 mg | Freq: Once | INTRAMUSCULAR | Status: AC
  Administered 2019-05-05: 12 mg via INTRAMUSCULAR
  Filled 2019-05-05: qty 5

## 2019-05-05 MED ORDER — LACTATED RINGERS IV SOLN
INTRAVENOUS | Status: DC | PRN
  Administered 2019-05-05 – 2019-05-06 (×3): via INTRAVENOUS

## 2019-05-05 MED ORDER — MAGNESIUM SULFATE 40 GM/1000ML IV SOLN
2.0000 g/h | INTRAVENOUS | Status: AC
  Administered 2019-05-05 – 2019-05-07 (×3): 2 g/h via INTRAVENOUS
  Filled 2019-05-05 (×3): qty 1000

## 2019-05-05 MED ORDER — TRANEXAMIC ACID-NACL 1000-0.7 MG/100ML-% IV SOLN
1000.0000 mg | INTRAVENOUS | Status: DC
  Filled 2019-05-05: qty 100

## 2019-05-05 MED ORDER — HYDRALAZINE HCL 20 MG/ML IJ SOLN: 10.0000 mg | INTRAMUSCULAR | Status: DC | PRN

## 2019-05-05 MED ORDER — DOCUSATE SODIUM 100 MG PO CAPS: 100.0000 mg | ORAL_CAPSULE | Freq: Every day | ORAL | Status: DC

## 2019-05-05 MED ORDER — PRENATAL MULTIVITAMIN CH: 1.0000 | ORAL_TABLET | Freq: Every day | ORAL | Status: DC

## 2019-05-05 MED ORDER — LABETALOL HCL 5 MG/ML IV SOLN
40.0000 mg | INTRAVENOUS | Status: DC | PRN
  Administered 2019-05-05: 40 mg via INTRAVENOUS
  Filled 2019-05-05: qty 8

## 2019-05-05 MED ORDER — LABETALOL HCL 5 MG/ML IV SOLN
20.0000 mg | INTRAVENOUS | Status: DC | PRN
  Administered 2019-05-05: 20 mg via INTRAVENOUS
  Filled 2019-05-05: qty 4

## 2019-05-05 MED ORDER — LABETALOL HCL 5 MG/ML IV SOLN: 80.0000 mg | INTRAVENOUS | Status: DC | PRN

## 2019-05-05 NOTE — Consult Note (Signed)
Neonatology Consultation: Antenatal Requested by: Anyanwu Reason: 67 week twin gestation, planned delivery for maternal indications  The patient has a history of chronic hypertension, pre-eclampsia, morbid obesity, HSV-2 history.  I explained the usual expected course for normally grown babies born at [redacted] weeks EGA, which includes IV feeding followed by NG feeding before oral feeding is possible.  I explained that respiratory problems may occur that might need CPAP or oxygen treatment but that these are uncommon at 34 weeks.  We discussed the usual duration of hospital care for babies born at 63 weeks.  R.L. Haywood Meinders M.D.

## 2019-05-05 NOTE — MAU Note (Signed)
Per Dr Harolyn Rutherford fetal tracing in MAU okay as pt had 8/8 BPP earlier today. Heart tones obtained prior to transfer to [facility].

## 2019-05-05 NOTE — H&P (Signed)
FACULTY PRACTICE ANTEPARTUM ADMISSION HISTORY AND PHYSICAL NOTE   History of Present Illness: Krystal Hartman is a 28 y.o. [phone] at 34w3dwith di/di twins admitted for chronic hypertension with si preeclampsia. Was in MFM today for a BPP. Had BPP 8/8 x2. Elevated BPs so was sent over for further evaluation & BMZ. Pt has chronic hypertension & is currently taking amlodipine 10 mg daily, last took it this morning. Denies headache, visual disturbance or epigastric pain. Has history of preeclampsia in a previous pregnancy. Is scheduled for a repeat c/section.   Patient reports the fetal movement as active. Patient reports uterine contraction  activity as none. Patient reports  vaginal bleeding as none. Patient describes fluid per vagina as None. Fetal presentation is A= breech, B= transverse.  Patient Active Problem List   Diagnosis Date Noted  . Pain in left foot 03/03/2019  . Dichorionic diamniotic twin gestation 01/03/2019  . Previous cesarean delivery affecting pregnancy, antepartum 12/02/2018  . Supervision of high-risk pregnancy 11/21/2018  . Hx of preeclampsia, prior pregnancy, currently pregnant 11/21/2018  . IUD complication ([facility] 061/68/3729 . Anemia during pregnancy in second trimester 10/01/2012  . Chronic hypertension 06/10/2011  . Morbid obesity ([place] 05/25/2011  . Hidradenitis suppurativa 05/25/2011  . 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 Anemia   . Boil    on side  . Cesarean section wound complication 03/20/1114  Consider wound vac   . Hypertension    started on BP med yesterday  . 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 None present at PAT appt.  .Marland KitchenPONV (postoperative nausea and vomiting) 2013   pt. states she had a lot of nausea and vomiting after [facility] . P[facility]  induced hypertension   . Previous cesarean delivery, antepartum condition or complication 35/07/800  For tertiary C/S, no tubal     Past Surgical History:  Procedure Laterality Date  . CESAREAN SECTION    . CESAREAN SECTION  06/22/2011   Procedure: CESAREAN SECTION;  Surgeon: VEldred Manges MD;  [place];  Service: Gynecology;  Laterality: N/A;  MRSA positive, Latex Allergy  . CESAREAN SECTION N/A 12/11/2012   Procedure: REPEAT CESAREAN SECTION;  Surgeon: JWoodroe Mode MD;  [place];  Service: Obstetrics;  Laterality: N/A;  . EYE SURGERY    . IUD REMOVAL N/A 08/29/2017   Procedure: HYSTEROSCOPY WITH INTRAUTERINE DEVICE (IUD) REMOVAL;  Surgeon: AOsborne Oman MD;  [place];  Service: Gynecology;  Laterality: N/A;    OB History  Gravida Para Term Preterm AB Living  _0 0 1 3  SAB TAB Ectopic Multiple Live Births  1 0 0 0 3    # Outcome Date GA Lbr Len/2nd Weight Sex Delivery Anes PTL Lv  5 Current           4 Term 12/11/12 [id number] g M CS-LTranv Spinal, EPI  LIV     Birth Comments: repeat c/s  3 Term 2013 [id number] CS-LTranv   LIV     Birth Comments: repeat c/s, Hospitlallized for Polyhydraminous , preeclampsia for 2 months  2 SAB 2012 6w016w0d     1 Term 11/10/09 40w039w0d0 g M CS-LTranv   LIV     Birth Comments: facial  presentation, c/s due to ftp, IOL fo preeclampsia.     Social History   Socioeconomic History  . Marital status: Married    Spouse name: Not on file  . Number of children: Not on file  . Years of education: Not on file  . Highest education level: Not on file  Occupational History  . Not on file  Social Needs  . Financial resource strain: Not on file  . Food insecurity    Worry: Never true    Inability: Never true  . Transportation needs    Medical: No    Non-medical: No  Tobacco Use  . Smoking status: Never Smoker  . Smokeless tobacco: Never Used  Substance and Sexual Activity  . Alcohol use: No  . Drug use:  No  . Sexual activity: Yes    Birth control/protection: None  Lifestyle  . Physical activity    Days per week: Not on file    Minutes per session: Not on file  . Stress: Not on file  Relationships  . Social Herbalist on phone: Not on file    Gets together: Not on file    Attends religious service: Not on file    Active member of club or organization: Not on file    Attends meetings of clubs or organizations: Not on file    Relationship status: Not on file  Other Topics Concern  . Not on file  Social History Narrative  . Not on file    Family History  Problem Relation Age of Onset  . Diabetes Mother   . Diabetes Maternal Aunt   . Diabetes Father   . Heart disease Father   . Colon cancer Father   . Cancer Maternal Grandmother     Allergies  Allergen Reactions  . Latex Hives and Itching  . Pork-Derived Products Other (See Comments)    Does not eat due to religious reasons.    Medications Prior to Admission  Medication Sig Dispense Refill Last Dose  . amLODipine (NORVASC) 10 MG tablet Take 1 tablet (10 mg total) by mouth daily. 60 tablet 1 05/05/2019 at Unknown time  . aspirin EC 81 MG tablet Take 1 tablet (81 mg total) by mouth daily. 100 tablet 2 05/05/2019 at Unknown time  . Prenatal Vit-Fe Fumarate-FA (PRENATAL VITAMINS PO) Take 1 tablet by mouth daily.   05/05/2019 at Unknown time  . acetaminophen (TYLENOL) 325 MG tablet Take 650 mg by mouth every 6 (six) hours as needed for mild pain or headache.      . AMBULATORY NON FORMULARY MEDICATION 1 Device by Other route once a week. Medication Name: Blood Pressure Cuff, extra large Monitored regularly at home ICD 10  O09.90,  O09.299 1 kit 0   . docusate sodium (COLACE) 100 MG capsule Take 1 capsule (100 mg total) by mouth 2 (two) times daily. 30 capsule 3     Review of Systems - History obtained from the patient General ROS: negative for - chills or fever Respiratory ROS: negative for - cough or shortness of  breath Cardiovascular ROS: no chest pain or dyspnea on exertion Genito-Urinary ROS: negative for - LOF or vaginal bleeding  Vitals:  BP (!) 170/100   Pulse 97   Temp 98.2 F (36.8 C) (Oral)   Resp 20   Ht '5\' 6"'$  (1.676 m)   Wt (!) 203 kg   LMP 09/03/2018 (Approximate)   SpO2 98%   BMI 72.24 kg/m   Patient  Vitals for the past 24 hrs:  BP Temp Temp src Pulse Resp SpO2 Height Weight  05/05/19 1519 (!) 170/100 - - 97 - - - -  05/05/19 1501 (!) 160/99 - - (!) 103 - - - -  05/05/19 1446 (!) 143/91 - - (!) 108 - - - -  05/05/19 1437 (!) 152/86 - - (!) 102 - - - -  05/05/19 1422 (!) 150/95 - - (!) 107 - - - -  05/05/19 1407 (!) 142/89 - - (!) 110 - - - -  05/05/19 1354 (!) 144/95 - - (!) 115 - - - -  05/05/19 1336 (!) 172/97 98.2 F (36.8 C) Oral (!) 104 20 98 % '5\' 6"'$  (1.676 m) (!) 203 kg    Physical Examination: CONSTITUTIONAL: Well-developed, well-nourished female in no acute distress.  HENT:  Normocephalic, atraumatic, External right and left ear normal. Oropharynx is clear and moist EYES: Conjunctivae and EOM are normal. Pupils are equal, round, and reactive to light. No scleral icterus.  NECK: Normal range of motion, supple, no masses SKIN: Skin is warm and dry. No rash noted. Not diaphoretic. No erythema. No pallor. [place]: Alert and oriented to person, place, and time. Normal reflexes, muscle tone coordination. No cranial nerve deficit noted. PSYCHIATRIC: Normal mood and affect. Normal behavior. Normal judgment and thought content. CARDIOVASCULAR: Normal heart rate noted, regular rhythm RESPIRATORY: Effort and breath sounds normal, no problems with respiration noted ABDOMEN: Soft, nontender, nondistended, gravid. MUSCULOSKELETAL: Normal range of motion. No edema and no tenderness. 2+ distal pulses.   Fetal Monitoring: unable to obtain continuous monitoring due to maternal body habitus   Labs:  Results for orders placed or performed during the hospital encounter of  05/05/19 (from the past 24 hour(s))  Urinalysis, Routine w reflex microscopic   Collection Time: 05/05/19  1:44 PM  Result Value Ref Range   Color, Urine AMBER (A) YELLOW   APPearance CLOUDY (A) CLEAR   Specific Gravity, Urine 1.029 1.005 - 1.030   pH 5.0 5.0 - 8.0   Glucose, UA NEGATIVE NEGATIVE mg/dL   Hgb urine dipstick NEGATIVE NEGATIVE   Bilirubin Urine NEGATIVE NEGATIVE   Ketones, ur NEGATIVE NEGATIVE mg/dL   Protein, ur 100 (A) NEGATIVE mg/dL   Nitrite NEGATIVE NEGATIVE   Leukocytes,Ua MODERATE (A) NEGATIVE   RBC / HPF 0-5 0 - 5 RBC/hpf   WBC, UA 6-10 0 - 5 WBC/hpf   Bacteria, UA FEW (A) NONE SEEN   Squamous Epithelial / LPF 6-10 0 - 5   Mucus PRESENT   CBC   Collection Time: 05/05/19  1:53 PM  Result Value Ref Range   WBC 8.0 4.0 - 10.5 K/uL   RBC 3.89 3.87 - 5.11 MIL/uL   Hemoglobin 10.7 (L) 12.0 - 15.0 g/dL   HCT 32.3 (L) 36.0 - 46.0 %   MCV 83.0 80.0 - 100.0 fL   MCH 27.5 26.0 - 34.0 pg   MCHC 33.1 30.0 - 36.0 g/dL   RDW 18.1 (H) 11.5 - 15.5 %   Platelets 228 150 - 400 K/uL   nRBC 0.0 0.0 - 0.2 %  Comprehensive metabolic panel   Collection Time: 05/05/19  1:53 PM  Result Value Ref Range   Sodium 137 135 - 145 mmol/L   Potassium 3.9 3.5 - 5.1 mmol/L   Chloride 110 98 - 111 mmol/L   CO2 18 (L) 22 - 32 mmol/L   Glucose, Bld 84 70 - 99 mg/dL   BUN 6 6 -  20 mg/dL   Creatinine, Ser 0.66 0.44 - 1.00 mg/dL   Calcium 8.8 (L) 8.9 - 10.3 mg/dL   Total Protein 7.3 6.5 - 8.1 g/dL   Albumin 2.6 (L) 3.5 - 5.0 g/dL   AST 17 15 - 41 U/L   ALT 12 0 - 44 U/L   Alkaline Phosphatase 83 38 - 126 U/L   Total Bilirubin 0.2 (L) 0.3 - 1.2 mg/dL   GFR calc non Af Amer >60 >60 mL/min   GFR calc Af Amer >60 >60 mL/min   Anion gap 9 5 - 15  Protein / creatinine ratio, urine   Collection Time: 05/05/19  2:00 PM  Result Value Ref Range   Creatinine, Urine 359.50 mg/dL   Total Protein, Urine 79 mg/dL   Protein Creatinine Ratio 0.22 (H) 0.00 - 0.15 mg/mg[Cre]  SARS Coronavirus 2  by RT PCR (hospital order, performed in [facility] hospital lab) Nasopharyngeal Nasopharyngeal Swab   Collection Time: 05/05/19  2:09 PM   Specimen: Nasopharyngeal Swab  Result Value Ref Range   SARS Coronavirus 2 NEGATIVE NEGATIVE    Imaging Studies: [place] Mfm Fetal Bpp Wo Non Stress  Result Date: 05/05/2019 ----------------------------------------------------------------------  OBSTETRICS REPORT                       (Signed Final 05/05/2019 10:44 am) ---------------------------------------------------------------------- Patient Info  ID #:       [id number]                          D.O.B.:  02-20-1991 (28 yrs)  Name:       Krystal Hartman               Visit Date: 05/05/2019 09:19 am ---------------------------------------------------------------------- Performed By  Performed By:     Vianne Bulls Tester BS,       Ref. Address:     [address], [place]                                                             [address]  Attending:        Johnell Comings MD         [facility]  [facility]      [facility] ---------------------------------------------------------------------- Orders   #  Description                          Code         Ordered By   1  [place] MFM FETAL BPP WO NON              76819.01     RAVI [facility]  STRESS   2  [place] MFM FETAL BPP WO NST              76819.1      RAVI [facility]      ADDL GESTATION  ----------------------------------------------------------------------   #  Order #                    Accession #                 Episode #   1  [id number]                  [id number]                  [id number]   2  [id number]                  [id number]                  [id number]  ---------------------------------------------------------------------- Indications   Twin pregnancy, di/di, third trimester         O30.043   Maternal morbid obesity (392 lbs)              O99.210 E66.01    Hypertension - Chronic/Pre-existing (on        O10.019   amlodipine)   Poor obstetric history: Previous               O09.299   preeclampsia / eclampsia/gestational HTN   [redacted] weeks gestation of pregnancy                Z3A.34   Encounter for other antenatal screening        Z36.2   follow-up   Previous cesarean delivery, antepartum         O34.219  ---------------------------------------------------------------------- Vital Signs                                                 Height:        5'6" ---------------------------------------------------------------------- Fetal Evaluation (Fetus A)  Num Of Fetuses:         2  Fetal Heart Rate(bpm):  141  Cardiac Activity:       Observed  Fetal Lie:              Lower Fetus Maternal Left  Presentation:           Breech  Placenta:               Anterior  P. Cord Insertion:      Previously Visualized  Membrane Desc:      Dividing Membrane seen  Amniotic Fluid  AFI FV:      Subjectively upper-normal                              Largest Pocket(cm)                              7.75 ---------------------------------------------------------------------- Biophysical Evaluation (Fetus A)  Amniotic F.V:   Within normal limits       F. Tone:        Observed  F. Movement:    Observed  Score:          8/8  F. Breathing:   Observed ---------------------------------------------------------------------- OB History  Gravidity:    5         Term:   3        Prem:   0        SAB:   1  TOP:          0       Ectopic:  0        Living: 3 ---------------------------------------------------------------------- Gestational Age (Fetus A)  LMP:           34w 6d        Date:  09/03/18                 EDD:   06/10/19  Best:          34w 3d     Det. By:  U/S Fetus B              EDD:   06/13/19                                      (12/25/18) ---------------------------------------------------------------------- Anatomy (Fetus A)  Thoracic:              Appears normal         Cord  Vessels:           Appears normal (3                                                                        vessel cord)  Diaphragm:             Appears normal         Kidneys:                Appear normal  Stomach:               Appears normal, left   Bladder:                Appears normal                         sided ---------------------------------------------------------------------- Fetal Evaluation (Fetus B)  Num Of Fetuses:         2  Fetal Heart Rate(bpm):  136  Cardiac Activity:       Observed  Fetal Lie:              Upper Fetus  Presentation:           Transverse, head to maternal right  Placenta:               Right lateral  P. Cord Insertion:      Previously Visualized  Membrane Desc:      Dividing Membrane seen  Amniotic Fluid  AFI FV:      Within normal limits                              Largest Pocket(cm)  9.16 ---------------------------------------------------------------------- Biophysical Evaluation (Fetus B)  Amniotic F.V:   Within normal limits       F. Tone:        Observed  F. Movement:    Observed                   Score:          8/8  F. Breathing:   Observed ---------------------------------------------------------------------- Gestational Age (Fetus B)  LMP:           34w 6d        Date:  09/03/18                 EDD:   06/10/19  Best:          34w 3d     Det. By:  U/S Fetus B              EDD:   06/13/19                                      (12/25/18) ---------------------------------------------------------------------- Anatomy (Fetus B)  Thoracic:              Appears normal         Cord Vessels:           Appears normal (3                                                                        vessel cord)  Diaphragm:             Appears normal         Kidneys:                Appear normal  Stomach:               Appears normal, left   Bladder:                Appears normal                         sided  ---------------------------------------------------------------------- Cervix Uterus Adnexa  Cervix  Not visualized (advanced GA >24wks)  Uterus  No abnormality visualized.  Left Ovary  Not visualized. No adnexal mass visualized.  Right Ovary  Not visualized. No adnexal mass visualized.  Cul De Sac  No free fluid seen.  Adnexa  No abnormality visualized. ---------------------------------------------------------------------- Comments  This patient was seen for a biophysical profile due to a  dichorionic, diamniotic twin gestation, maternal obesity, and  chronic hypertension currently treated with amlodipine.  During her ultrasound today, the patient's blood pressures  were noted to be 155/113 upon repeat it was 153/107.  The  patient denies any symptoms of preeclampsia.  She reports  that she took her amlodipine dose already this morning.  A biophysical profile performed today was 8 out of 8 for both  twin A and twin B.  Borderline polyhydramnios was noted in both fetuses today.  Due to her extremely elevated blood pressures, the patient  was sent to the hospital to be evaluated in the MAU  immediately following  today's ultrasound exam.  A course of  antenatal corticosteroids should be administered if her blood  pressures continue to be elevated. ----------------------------------------------------------------------                   Johnell Comings, MD Electronically Signed Final Report   05/05/2019 10:44 am ----------------------------------------------------------------------  [place] Mfm Fetal Bpp Wo Nst Addl Gestation  Result Date: 05/05/2019 ----------------------------------------------------------------------  OBSTETRICS REPORT                       (Signed Final 05/05/2019 10:44 am) ---------------------------------------------------------------------- Patient Info  ID #:       [id number]                          D.O.B.:  1990-08-27 (28 yrs)  Name:       Krystal Hartman               Visit Date: 05/05/2019 09:19  am ---------------------------------------------------------------------- Performed By  Performed By:     Jacob Moores BS,       Ref. Address:     [address], [place]                                                             [address]  Attending:        Johnell Comings MD         [facility]  Referred By:      [facility] ---------------------------------------------------------------------- Orders   #  Description                          Code         Ordered By   1  [place] MFM FETAL BPP WO NON              76819.01     RAVI [facility]      STRESS   2  [place] MFM FETAL BPP WO NST              93903.0      RAVI [facility]      ADDL GESTATION  ----------------------------------------------------------------------   #  Order #                    Accession #                 Episode #   1  [id number]                  [id number]   2  [id number]  [id number]                  [id number]  ---------------------------------------------------------------------- Indications   Twin pregnancy, di/di, third trimester         O30.043   Maternal morbid obesity (392 lbs)              O99.210 E66.01   Hypertension - Chronic/Pre-existing (on        O10.019   amlodipine)   Poor obstetric history: Previous               O09.299   preeclampsia / eclampsia/gestational HTN   [redacted] weeks gestation of pregnancy                Z3A.34   Encounter for other antenatal screening        Z36.2   follow-up   Previous cesarean delivery, antepartum         O34.219  ---------------------------------------------------------------------- Vital Signs                                                 Height:        5'6" ---------------------------------------------------------------------- Fetal Evaluation (Fetus A)  Num Of Fetuses:         2  Fetal Heart Rate(bpm):  141  Cardiac Activity:       Observed  Fetal Lie:               Lower Fetus Maternal Left  Presentation:           Breech  Placenta:               Anterior  P. Cord Insertion:      Previously Visualized  Membrane Desc:      Dividing Membrane seen  Amniotic Fluid  AFI FV:      Subjectively upper-normal                              Largest Pocket(cm)                              7.75 ---------------------------------------------------------------------- Biophysical Evaluation (Fetus A)  Amniotic F.V:   Within normal limits       F. Tone:        Observed  F. Movement:    Observed                   Score:          8/8  F. Breathing:   Observed ---------------------------------------------------------------------- OB History  Gravidity:    5         Term:   3        Prem:   0        SAB:   1  TOP:          0       Ectopic:  0        Living: 3 ---------------------------------------------------------------------- Gestational Age (Fetus A)  LMP:           34w 6d        Date:  09/03/18                 EDD:   06/10/19  Best:  34w 3d     Det. By:  U/S Fetus B              EDD:   06/13/19                                      (12/25/18) ---------------------------------------------------------------------- Anatomy (Fetus A)  Thoracic:              Appears normal         Cord Vessels:           Appears normal (3                                                                        vessel cord)  Diaphragm:             Appears normal         Kidneys:                Appear normal  Stomach:               Appears normal, left   Bladder:                Appears normal                         sided ---------------------------------------------------------------------- Fetal Evaluation (Fetus B)  Num Of Fetuses:         2  Fetal Heart Rate(bpm):  136  Cardiac Activity:       Observed  Fetal Lie:              Upper Fetus  Presentation:           Transverse, head to maternal right  Placenta:               Right lateral  P. Cord Insertion:      Previously Visualized  Membrane Desc:       Dividing Membrane seen  Amniotic Fluid  AFI FV:      Within normal limits                              Largest Pocket(cm)                              9.16 ---------------------------------------------------------------------- Biophysical Evaluation (Fetus B)  Amniotic F.V:   Within normal limits       F. Tone:        Observed  F. Movement:    Observed                   Score:          8/8  F. Breathing:   Observed ---------------------------------------------------------------------- Gestational Age (Fetus B)  LMP:           34w 6d        Date:  09/03/18                 EDD:   06/10/19  Best:  34w 3d     Det. By:  U/S Fetus B              EDD:   06/13/19                                      (12/25/18) ---------------------------------------------------------------------- Anatomy (Fetus B)  Thoracic:              Appears normal         Cord Vessels:           Appears normal (3                                                                        vessel cord)  Diaphragm:             Appears normal         Kidneys:                Appear normal  Stomach:               Appears normal, left   Bladder:                Appears normal                         sided ---------------------------------------------------------------------- Cervix Uterus Adnexa  Cervix  Not visualized (advanced GA >24wks)  Uterus  No abnormality visualized.  Left Ovary  Not visualized. No adnexal mass visualized.  Right Ovary  Not visualized. No adnexal mass visualized.  Cul De Sac  No free fluid seen.  Adnexa  No abnormality visualized. ---------------------------------------------------------------------- Comments  This patient was seen for a biophysical profile due to a  dichorionic, diamniotic twin gestation, maternal obesity, and  chronic hypertension currently treated with amlodipine.  During her ultrasound today, the patient's blood pressures  were noted to be 155/113 upon repeat it was 153/107.  The  patient denies any symptoms of  preeclampsia.  She reports  that she took her amlodipine dose already this morning.  A biophysical profile performed today was 8 out of 8 for both  twin A and twin B.  Borderline polyhydramnios was noted in both fetuses today.  Due to her extremely elevated blood pressures, the patient  was sent to the hospital to be evaluated in the MAU  immediately following today's ultrasound exam.  A course of  antenatal corticosteroids should be administered if her blood  pressures continue to be elevated. ----------------------------------------------------------------------                   Johnell Comings, MD Electronically Signed Final Report   05/05/2019 10:44 am ----------------------------------------------------------------------    Assessment and Plan: 1. Chronic hypertension with superimposed preeclampsia  -severe range BPs x 3. Preeclampsia labs negative -given first dose of BMZ in MAU at 1450 -Will start mag sulfate for seizure prophylaxis -COVID swab pending  2. [redacted] weeks gestation of pregnancy   3. Dichorionic diamniotic twin pregnancy in third trimester  -BPP 8/8 x 2 done today. Unable to continuously monitor due to maternal body habitus  4. Previous cesarean delivery  affecting pregnancy, antepartum  -will have repeat c/section. Last ate a roast beef sandwich en route to the hospital  5. Morbid obesity ([place])     Jorje Guild, NP 05/05/2019 3:25 PM

## 2019-05-05 NOTE — MAU Note (Addendum)
Wet for weekly NST and [place], BP was elevated, so he told her to come over here.  Hx of pre-e with other preg.  Is on BP meds, took it this morning. Denies HA, visual changes, epigastric pain or increase in swelling.  Has ctx's when she walks.  States babies were goon on the monitor

## 2019-05-05 NOTE — Progress Notes (Signed)
Report called to [facility], RN in MAU.  Pt to go to MAU for evaluation of elevated BP.

## 2019-05-05 NOTE — Progress Notes (Signed)
Despite numerous adjustments by this nurse and 3 other RN's on the unit, because of patient's habitus and twin gestation, it has been difficult to maintain a continuous FHR tracing.

## 2019-05-06 ENCOUNTER — Encounter (HOSPITAL_COMMUNITY): Payer: Self-pay | Admitting: Anesthesiology

## 2019-05-06 ENCOUNTER — Inpatient Hospital Stay (HOSPITAL_COMMUNITY): Payer: Medicaid Other | Admitting: Anesthesiology

## 2019-05-06 ENCOUNTER — Encounter (HOSPITAL_COMMUNITY)
Admission: AD | Disposition: A | Discharge status: Home / Self Care | Payer: Self-pay | Source: Home / Self Care | Attending: Obstetrics & Gynecology

## 2019-05-06 DIAGNOSIS — O30043 Twin pregnancy, dichorionic/diamniotic, third trimester: Secondary | ICD-10-CM

## 2019-05-06 DIAGNOSIS — O34211 Maternal care for low transverse scar from previous cesarean delivery: Secondary | ICD-10-CM

## 2019-05-06 DIAGNOSIS — O403XX2 Polyhydramnios, third trimester, fetus 2: Secondary | ICD-10-CM

## 2019-05-06 DIAGNOSIS — O321XX1 Maternal care for breech presentation, fetus 1: Secondary | ICD-10-CM

## 2019-05-06 DIAGNOSIS — O1002 Pre-existing essential hypertension complicating childbirth: Secondary | ICD-10-CM

## 2019-05-06 DIAGNOSIS — O114 Pre-existing hypertension with pre-eclampsia, complicating childbirth: Secondary | ICD-10-CM

## 2019-05-06 DIAGNOSIS — Z3A34 34 weeks gestation of pregnancy: Secondary | ICD-10-CM

## 2019-05-06 DIAGNOSIS — O322XX2 Maternal care for transverse and oblique lie, fetus 2: Secondary | ICD-10-CM

## 2019-05-06 DIAGNOSIS — O403XX1 Polyhydramnios, third trimester, fetus 1: Secondary | ICD-10-CM

## 2019-05-06 LAB — CBC
HCT: 29.6 % — ABNORMAL LOW (ref 36.0–46.0)
HCT: 29.6 % — ABNORMAL LOW (ref 36.0–46.0)
Hemoglobin: 9.7 g/dL — ABNORMAL LOW (ref 12.0–15.0)
Hemoglobin: 9.7 g/dL — ABNORMAL LOW (ref 12.0–15.0)
MCH: 27.3 pg (ref 26.0–34.0)
MCH: 27.5 pg (ref 26.0–34.0)
MCHC: 32.8 g/dL (ref 30.0–36.0)
MCHC: 32.8 g/dL (ref 30.0–36.0)
MCV: 83.4 fL (ref 80.0–100.0)
MCV: 83.9 fL (ref 80.0–100.0)
Platelets: 238 10*3/uL (ref 150–400)
Platelets: 213 10*3/uL (ref 150–400)
RBC: 3.55 MIL/uL — ABNORMAL LOW (ref 3.87–5.11)
RBC: 3.53 MIL/uL — ABNORMAL LOW (ref 3.87–5.11)
RDW: 18.2 % — ABNORMAL HIGH (ref 11.5–15.5)
RDW: 18 % — ABNORMAL HIGH (ref 11.5–15.5)
WBC: 6.2 10*3/uL (ref 4.0–10.5)
WBC: 9.7 10*3/uL (ref 4.0–10.5)
nRBC: 0 % (ref 0.0–0.2)
nRBC: 0 % (ref 0.0–0.2)

## 2019-05-06 LAB — COMPREHENSIVE METABOLIC PANEL
ALT: 12 U/L (ref 0–44)
ALT: 14 U/L (ref 0–44)
AST: 17 U/L (ref 15–41)
AST: 20 U/L (ref 15–41)
Albumin: 2.4 g/dL — ABNORMAL LOW (ref 3.5–5.0)
Albumin: 2.3 g/dL — ABNORMAL LOW (ref 3.5–5.0)
Alkaline Phosphatase: 77 U/L (ref 38–126)
Alkaline Phosphatase: 64 U/L (ref 38–126)
Anion gap: 11 (ref 5–15)
Anion gap: 8 (ref 5–15)
BUN: 8 mg/dL (ref 6–20)
BUN: 7 mg/dL (ref 6–20)
CO2: 17 mmol/L — ABNORMAL LOW (ref 22–32)
CO2: 20 mmol/L — ABNORMAL LOW (ref 22–32)
Calcium: 8.2 mg/dL — ABNORMAL LOW (ref 8.9–10.3)
Calcium: 7.9 mg/dL — ABNORMAL LOW (ref 8.9–10.3)
Chloride: 106 mmol/L (ref 98–111)
Chloride: 106 mmol/L (ref 98–111)
Creatinine, Ser: 0.64 mg/dL (ref 0.44–1.00)
Creatinine, Ser: 0.69 mg/dL (ref 0.44–1.00)
GFR calc Af Amer: >60 mL/min (ref >60)
GFR calc Af Amer: >60 mL/min (ref >60)
GFR calc non Af Amer: >60 mL/min (ref >60)
GFR calc non Af Amer: >60 mL/min (ref >60)
Glucose, Bld: 184 mg/dL — ABNORMAL HIGH (ref 70–99)
Glucose, Bld: 152 mg/dL — ABNORMAL HIGH (ref 70–99)
Potassium: 4.2 mmol/L (ref 3.5–5.1)
Potassium: 5 mmol/L (ref 3.5–5.1)
Sodium: 134 mmol/L — ABNORMAL LOW (ref 135–145)
Sodium: 134 mmol/L — ABNORMAL LOW (ref 135–145)
Total Bilirubin: 0.4 mg/dL (ref 0.3–1.2)
Total Bilirubin: 0.4 mg/dL (ref 0.3–1.2)
Total Protein: 6.8 g/dL (ref 6.5–8.1)
Total Protein: 6.6 g/dL (ref 6.5–8.1)

## 2019-05-06 LAB — CULTURE, OB URINE: Culture: <10000 — AB

## 2019-05-06 LAB — RPR: RPR Ser Ql: NONREACTIVE

## 2019-05-06 LAB — MAGNESIUM: Magnesium: 4.3 mg/dL — ABNORMAL HIGH (ref 1.7–2.4)

## 2019-05-06 LAB — PREPARE RBC (CROSSMATCH)

## 2019-05-06 SURGERY — Surgical Case
Anesthesia: Spinal | Site: Abdomen | Wound class: Clean Contaminated

## 2019-05-06 MED ORDER — KETOROLAC TROMETHAMINE 30 MG/ML IJ SOLN: 30.0000 mg | Freq: Four times a day (QID) | INTRAMUSCULAR | Status: AC | PRN

## 2019-05-06 MED ORDER — SODIUM CHLORIDE 0.9 % IV SOLN
2.0000 g | Freq: Two times a day (BID) | INTRAVENOUS | Status: AC
  Administered 2019-05-06 – 2019-05-07 (×2): 2 g via INTRAVENOUS
  Filled 2019-05-06 (×2): qty 2

## 2019-05-06 MED ORDER — ONDANSETRON HCL 4 MG/2ML IJ SOLN
4.0000 mg | Freq: Three times a day (TID) | INTRAMUSCULAR | Status: DC | PRN
  Administered 2019-05-06: 4 mg via INTRAVENOUS
  Filled 2019-05-06: qty 2

## 2019-05-06 MED ORDER — LABETALOL HCL 5 MG/ML IV SOLN: 20.0000 mg | INTRAVENOUS | Status: DC | PRN

## 2019-05-06 MED ORDER — SODIUM CHLORIDE 0.9 % IR SOLN
Status: DC | PRN
  Administered 2019-05-06: 1000 mL

## 2019-05-06 MED ORDER — GABAPENTIN 300 MG PO CAPS
300.0000 mg | ORAL_CAPSULE | Freq: Two times a day (BID) | ORAL | Status: DC
  Administered 2019-05-06 – 2019-05-09 (×6): 300 mg via ORAL
  Filled 2019-05-06 (×6): qty 1

## 2019-05-06 MED ORDER — STERILE WATER FOR IRRIGATION IR SOLN
Status: DC | PRN
  Administered 2019-05-06: 1000 mL

## 2019-05-06 MED ORDER — TRAMADOL HCL 50 MG PO TABS: 50.0000 mg | ORAL_TABLET | Freq: Four times a day (QID) | ORAL | Status: DC | PRN

## 2019-05-06 MED ORDER — NALBUPHINE HCL 10 MG/ML IJ SOLN
5.0000 mg | INTRAMUSCULAR | Status: DC | PRN
  Filled 2019-05-06: qty 0.5

## 2019-05-06 MED ORDER — LACTATED RINGERS IV SOLN: INTRAVENOUS | Status: DC

## 2019-05-06 MED ORDER — FERROUS SULFATE 325 (65 FE) MG PO TABS
325.0000 mg | ORAL_TABLET | Freq: Two times a day (BID) | ORAL | Status: DC
  Administered 2019-05-07 – 2019-05-09 (×5): 325 mg via ORAL
  Filled 2019-05-06 (×5): qty 1

## 2019-05-06 MED ORDER — FENTANYL CITRATE (PF) 100 MCG/2ML IJ SOLN: 25.0000 ug | INTRAMUSCULAR | Status: DC | PRN

## 2019-05-06 MED ORDER — MORPHINE SULFATE (PF) 0.5 MG/ML IJ SOLN
INTRAMUSCULAR | Status: AC
  Filled 2019-05-06: qty 10

## 2019-05-06 MED ORDER — BUPIVACAINE IN DEXTROSE 0.75-8.25 % IT SOLN
INTRATHECAL | Status: AC
  Filled 2019-05-06: qty 2

## 2019-05-06 MED ORDER — DIBUCAINE (PERIANAL) 1 % EX OINT: 1.0000 "application " | TOPICAL_OINTMENT | CUTANEOUS | Status: DC | PRN

## 2019-05-06 MED ORDER — BUPIVACAINE IN DEXTROSE 0.75-8.25 % IT SOLN
INTRATHECAL | Status: DC | PRN
  Administered 2019-05-06: 1.6 mL via INTRATHECAL

## 2019-05-06 MED ORDER — SODIUM CHLORIDE 0.9 % IV SOLN: 2.0000 g | Freq: Two times a day (BID) | INTRAVENOUS | Status: DC

## 2019-05-06 MED ORDER — TRANEXAMIC ACID-NACL 1000-0.7 MG/100ML-% IV SOLN
1000.0000 mg | Freq: Once | INTRAVENOUS | Status: AC
  Administered 2019-05-06: 1000 mg via INTRAVENOUS
  Filled 2019-05-06 (×2): qty 100

## 2019-05-06 MED ORDER — SENNOSIDES-DOCUSATE SODIUM 8.6-50 MG PO TABS
2.0000 | ORAL_TABLET | ORAL | Status: DC
  Administered 2019-05-07 – 2019-05-09 (×3): 2 via ORAL
  Filled 2019-05-06 (×3): qty 2

## 2019-05-06 MED ORDER — DEXAMETHASONE SODIUM PHOSPHATE 10 MG/ML IJ SOLN
INTRAMUSCULAR | Status: DC | PRN
  Administered 2019-05-06: 10 mg via INTRAVENOUS

## 2019-05-06 MED ORDER — OXYTOCIN 40 UNITS IN NORMAL SALINE INFUSION - SIMPLE MED
INTRAVENOUS | Status: DC | PRN
  Administered 2019-05-06: 40 [IU] via INTRAVENOUS

## 2019-05-06 MED ORDER — SODIUM CHLORIDE 0.9 % IV SOLN
2.0000 g | INTRAVENOUS | Status: AC
  Administered 2019-05-06: 2 g via INTRAVENOUS
  Filled 2019-05-06: qty 2

## 2019-05-06 MED ORDER — FUROSEMIDE 10 MG/ML IJ SOLN
INTRAMUSCULAR | Status: AC
  Filled 2019-05-06: qty 4

## 2019-05-06 MED ORDER — PHENYLEPHRINE 40 MCG/ML (10ML) SYRINGE FOR IV PUSH (FOR BLOOD PRESSURE SUPPORT)
PREFILLED_SYRINGE | INTRAVENOUS | Status: AC
  Filled 2019-05-06: qty 10

## 2019-05-06 MED ORDER — KETOROLAC TROMETHAMINE 30 MG/ML IJ SOLN
INTRAMUSCULAR | Status: AC
  Filled 2019-05-06: qty 1

## 2019-05-06 MED ORDER — PRENATAL MULTIVITAMIN CH
1.0000 | ORAL_TABLET | Freq: Every day | ORAL | Status: DC
  Administered 2019-05-07 – 2019-05-09 (×2): 1 via ORAL
  Filled 2019-05-06 (×2): qty 1

## 2019-05-06 MED ORDER — NALBUPHINE HCL 10 MG/ML IJ SOLN
5.0000 mg | Freq: Once | INTRAMUSCULAR | Status: DC | PRN
  Filled 2019-05-06: qty 0.5

## 2019-05-06 MED ORDER — OXYCODONE-ACETAMINOPHEN 5-325 MG PO TABS
2.0000 | ORAL_TABLET | ORAL | Status: DC | PRN
  Administered 2019-05-09: 2 via ORAL
  Filled 2019-05-06: qty 2

## 2019-05-06 MED ORDER — PHENYLEPHRINE HCL (PRESSORS) 10 MG/ML IV SOLN
INTRAVENOUS | Status: DC | PRN
  Administered 2019-05-06: 80 ug via INTRAVENOUS

## 2019-05-06 MED ORDER — TRANEXAMIC ACID-NACL 1000-0.7 MG/100ML-% IV SOLN
INTRAVENOUS | Status: AC
  Filled 2019-05-06: qty 100

## 2019-05-06 MED ORDER — LABETALOL HCL 5 MG/ML IV SOLN: 80.0000 mg | INTRAVENOUS | Status: DC | PRN

## 2019-05-06 MED ORDER — SCOPOLAMINE 1 MG/3DAYS TD PT72
1.0000 | MEDICATED_PATCH | Freq: Once | TRANSDERMAL | Status: DC
  Administered 2019-05-06: 1.5 mg via TRANSDERMAL
  Filled 2019-05-06: qty 1

## 2019-05-06 MED ORDER — MAGNESIUM HYDROXIDE 400 MG/5ML PO SUSP: 30.0000 mL | ORAL | Status: DC | PRN

## 2019-05-06 MED ORDER — SIMETHICONE 80 MG PO CHEW: 80.0000 mg | CHEWABLE_TABLET | ORAL | Status: DC | PRN

## 2019-05-06 MED ORDER — HYDROMORPHONE HCL 1 MG/ML IJ SOLN: 1.0000 mg | INTRAMUSCULAR | Status: DC | PRN

## 2019-05-06 MED ORDER — FONDAPARINUX SODIUM 2.5 MG/0.5ML ~~LOC~~ SOLN
2.5000 mg | Freq: Every day | SUBCUTANEOUS | Status: DC
  Administered 2019-05-07 – 2019-05-09 (×3): 2.5 mg via SUBCUTANEOUS
  Filled 2019-05-06 (×5): qty 0.5

## 2019-05-06 MED ORDER — FENTANYL CITRATE (PF) 100 MCG/2ML IJ SOLN
INTRAMUSCULAR | Status: DC | PRN
  Administered 2019-05-06: 15 ug via INTRATHECAL

## 2019-05-06 MED ORDER — OXYTOCIN 40 UNITS IN NORMAL SALINE INFUSION - SIMPLE MED: 2.5000 [IU]/h | INTRAVENOUS | Status: AC

## 2019-05-06 MED ORDER — MENTHOL 3 MG MT LOZG: 1.0000 | LOZENGE | OROMUCOSAL | Status: DC | PRN

## 2019-05-06 MED ORDER — SODIUM CHLORIDE 0.9% FLUSH: 3.0000 mL | INTRAVENOUS | Status: DC | PRN

## 2019-05-06 MED ORDER — MEASLES, MUMPS & RUBELLA VAC IJ SOLR: 0.5000 mL | Freq: Once | INTRAMUSCULAR | Status: DC

## 2019-05-06 MED ORDER — MORPHINE SULFATE (PF) 0.5 MG/ML IJ SOLN
INTRAMUSCULAR | Status: DC | PRN
  Administered 2019-05-06: .15 mg via INTRATHECAL

## 2019-05-06 MED ORDER — ONDANSETRON HCL 4 MG/2ML IJ SOLN
INTRAMUSCULAR | Status: DC | PRN
  Administered 2019-05-06: 4 mg via INTRAVENOUS

## 2019-05-06 MED ORDER — GABAPENTIN 300 MG PO CAPS
ORAL_CAPSULE | ORAL | Status: AC
  Filled 2019-05-06: qty 1

## 2019-05-06 MED ORDER — NALOXONE HCL 0.4 MG/ML IJ SOLN: 0.4000 mg | INTRAMUSCULAR | Status: DC | PRN

## 2019-05-06 MED ORDER — ZOLPIDEM TARTRATE 5 MG PO TABS: 5.0000 mg | ORAL_TABLET | Freq: Every evening | ORAL | Status: DC | PRN

## 2019-05-06 MED ORDER — SIMETHICONE 80 MG PO CHEW
80.0000 mg | CHEWABLE_TABLET | ORAL | Status: DC
  Administered 2019-05-07 – 2019-05-09 (×3): 80 mg via ORAL
  Filled 2019-05-06 (×3): qty 1

## 2019-05-06 MED ORDER — PHENYLEPHRINE HCL-NACL 20-0.9 MG/250ML-% IV SOLN
INTRAVENOUS | Status: DC | PRN
  Administered 2019-05-06: 30 ug/min via INTRAVENOUS

## 2019-05-06 MED ORDER — COCONUT OIL OIL: 1.0000 "application " | TOPICAL_OIL | Status: DC | PRN

## 2019-05-06 MED ORDER — KETOROLAC TROMETHAMINE 30 MG/ML IJ SOLN
30.0000 mg | Freq: Four times a day (QID) | INTRAMUSCULAR | Status: AC
  Administered 2019-05-06 – 2019-05-07 (×4): 30 mg via INTRAVENOUS
  Filled 2019-05-06 (×3): qty 1

## 2019-05-06 MED ORDER — WITCH HAZEL-GLYCERIN EX PADS: 1.0000 "application " | MEDICATED_PAD | CUTANEOUS | Status: DC | PRN

## 2019-05-06 MED ORDER — OXYCODONE HCL 5 MG PO TABS: 5.0000 mg | ORAL_TABLET | ORAL | Status: DC | PRN

## 2019-05-06 MED ORDER — ONDANSETRON HCL 4 MG/2ML IJ SOLN
INTRAMUSCULAR | Status: AC
  Filled 2019-05-06: qty 2

## 2019-05-06 MED ORDER — FUROSEMIDE 40 MG PO TABS
40.0000 mg | ORAL_TABLET | Freq: Every day | ORAL | Status: DC
  Administered 2019-05-07 – 2019-05-09 (×3): 40 mg via ORAL
  Filled 2019-05-06 (×4): qty 1

## 2019-05-06 MED ORDER — TRANEXAMIC ACID 1000 MG/10ML IV SOLN
INTRAVENOUS | Status: DC | PRN
  Administered 2019-05-06: 1000 mg via INTRAVENOUS

## 2019-05-06 MED ORDER — DIPHENHYDRAMINE HCL 25 MG PO CAPS: 25.0000 mg | ORAL_CAPSULE | ORAL | Status: DC | PRN

## 2019-05-06 MED ORDER — DEXAMETHASONE SODIUM PHOSPHATE 10 MG/ML IJ SOLN
INTRAMUSCULAR | Status: AC
  Filled 2019-05-06: qty 1

## 2019-05-06 MED ORDER — DIPHENHYDRAMINE HCL 50 MG/ML IJ SOLN: 12.5000 mg | INTRAMUSCULAR | Status: DC | PRN

## 2019-05-06 MED ORDER — IBUPROFEN 800 MG PO TABS
800.0000 mg | ORAL_TABLET | Freq: Four times a day (QID) | ORAL | Status: DC
  Administered 2019-05-07 – 2019-05-09 (×8): 800 mg via ORAL
  Filled 2019-05-06 (×9): qty 1

## 2019-05-06 MED ORDER — MEPERIDINE HCL 25 MG/ML IJ SOLN: 6.2500 mg | INTRAMUSCULAR | Status: DC | PRN

## 2019-05-06 MED ORDER — POTASSIUM CHLORIDE CRYS ER 20 MEQ PO TBCR
40.0000 meq | EXTENDED_RELEASE_TABLET | Freq: Every day | ORAL | Status: DC
  Administered 2019-05-07 – 2019-05-09 (×3): 40 meq via ORAL
  Filled 2019-05-06 (×3): qty 2

## 2019-05-06 MED ORDER — OXYTOCIN 40 UNITS IN NORMAL SALINE INFUSION - SIMPLE MED
INTRAVENOUS | Status: AC
  Filled 2019-05-06: qty 1000

## 2019-05-06 MED ORDER — SODIUM CHLORIDE 0.9 % IV SOLN
INTRAVENOUS | Status: DC | PRN
  Administered 2019-05-06: 11:00:00 via INTRAVENOUS

## 2019-05-06 MED ORDER — TETANUS-DIPHTH-ACELL PERTUSSIS 5-2.5-18.5 LF-MCG/0.5 IM SUSP: 0.5000 mL | Freq: Once | INTRAMUSCULAR | Status: DC

## 2019-05-06 MED ORDER — LACTATED RINGERS IV SOLN
INTRAVENOUS | Status: DC
  Administered 2019-05-06: 22:00:00 via INTRAVENOUS

## 2019-05-06 MED ORDER — NALOXONE HCL 4 MG/10ML IJ SOLN
1.0000 ug/kg/h | INTRAVENOUS | Status: DC | PRN
  Filled 2019-05-06: qty 5

## 2019-05-06 MED ORDER — LABETALOL HCL 5 MG/ML IV SOLN: 40.0000 mg | INTRAVENOUS | Status: DC | PRN

## 2019-05-06 MED ORDER — SOD CITRATE-CITRIC ACID 500-334 MG/5ML PO SOLN
30.0000 mL | Freq: Once | ORAL | Status: AC
  Administered 2019-05-06: 30 mL via ORAL
  Filled 2019-05-06: qty 30

## 2019-05-06 MED ORDER — HYDRALAZINE HCL 20 MG/ML IJ SOLN: 10.0000 mg | INTRAMUSCULAR | Status: DC | PRN

## 2019-05-06 MED ORDER — DIPHENHYDRAMINE HCL 25 MG PO CAPS: 25.0000 mg | ORAL_CAPSULE | Freq: Four times a day (QID) | ORAL | Status: DC | PRN

## 2019-05-06 MED ORDER — FUROSEMIDE 10 MG/ML IJ SOLN
40.0000 mg | Freq: Once | INTRAMUSCULAR | Status: AC
  Administered 2019-05-06: 40 mg via INTRAVENOUS

## 2019-05-06 MED ORDER — BETAMETHASONE SOD PHOS & ACET 6 (3-3) MG/ML IJ SUSP
12.0000 mg | Freq: Once | INTRAMUSCULAR | Status: AC
  Administered 2019-05-06: 12 mg via INTRAMUSCULAR

## 2019-05-06 MED ORDER — ENOXAPARIN SODIUM 40 MG/0.4ML ~~LOC~~ SOLN: 40.0000 mg | SUBCUTANEOUS | Status: DC

## 2019-05-06 MED ORDER — FENTANYL CITRATE (PF) 100 MCG/2ML IJ SOLN
INTRAMUSCULAR | Status: AC
  Filled 2019-05-06: qty 2

## 2019-05-06 SURGICAL SUPPLY — 37 items
CHLORAPREP W/TINT 26ML (MISCELLANEOUS) ×3 IMPLANT
CLAMP CORD UMBIL (MISCELLANEOUS) ×6 IMPLANT
CLOTH BEACON ORANGE TIMEOUT ST (SAFETY) ×3 IMPLANT
DRSG OPSITE POSTOP 4X10 (GAUZE/BANDAGES/DRESSINGS) ×3 IMPLANT
ELECT REM PT RETURN 9FT ADLT (ELECTROSURGICAL) ×3
ELECTRODE REM PT RTRN 9FT ADLT (ELECTROSURGICAL) ×1 IMPLANT
EXTENDER TRAXI PANNICULUS (MISCELLANEOUS) IMPLANT
GLOVE BIOGEL PI IND STRL 7.0 (GLOVE) ×3 IMPLANT
GLOVE BIOGEL PI INDICATOR 7.0 (GLOVE) ×6
GLOVE ECLIPSE 7.0 STRL STRAW (GLOVE) ×3 IMPLANT
GOWN STRL REUS W/TWL LRG LVL3 (GOWN DISPOSABLE) ×6 IMPLANT
HEMOSTAT SURGICEL 4X8 (HEMOSTASIS) ×2 IMPLANT
HOVERMATT SINGLE USE (MISCELLANEOUS) ×2 IMPLANT
KIT ABG SYR 3ML LUER SLIP (SYRINGE) ×2 IMPLANT
NDL HYPO 25X5/8 SAFETYGLIDE (NEEDLE) ×1 IMPLANT
NEEDLE HYPO 22GX1.5 SAFETY (NEEDLE) ×3 IMPLANT
NEEDLE HYPO 25X5/8 SAFETYGLIDE (NEEDLE) ×3 IMPLANT
NS IRRIG 1000ML POUR BTL (IV SOLUTION) ×3 IMPLANT
PACK C SECTION WH (CUSTOM PROCEDURE TRAY) ×3 IMPLANT
PAD ABD 7.5X8 STRL (GAUZE/BANDAGES/DRESSINGS) ×3 IMPLANT
PAD OB MATERNITY 4.3X12.25 (PERSONAL CARE ITEMS) ×3 IMPLANT
PENCIL BUTTON HOLSTER BLD 10FT (ELECTRODE) ×2 IMPLANT
PENCIL SMOKE EVAC W/HOLSTER (ELECTROSURGICAL) ×3 IMPLANT
PREVENA RESTOR AXIOFORM 29X28 (GAUZE/BANDAGES/DRESSINGS) ×2 IMPLANT
RETRACTOR TRAXI PANNICULUS (MISCELLANEOUS) IMPLANT
RTRCTR C-SECT PINK 25CM LRG (MISCELLANEOUS) ×2 IMPLANT
SUT PDS AB 0 CTX 60 (SUTURE) ×4 IMPLANT
SUT PLAIN 2 0 XLH (SUTURE) ×2 IMPLANT
SUT VIC AB 0 CTX 36 (SUTURE) ×6
SUT VIC AB 0 CTX36XBRD ANBCTRL (SUTURE) ×2 IMPLANT
SUT VIC AB 4-0 KS 27 (SUTURE) ×3 IMPLANT
SYR CONTROL 10ML LL (SYRINGE) ×3 IMPLANT
TOWEL OR 17X24 6PK STRL BLUE (TOWEL DISPOSABLE) ×3 IMPLANT
TRAXI PANNICULUS EXTENDER (MISCELLANEOUS) ×4
TRAXI PANNICULUS RETRACTOR (MISCELLANEOUS) ×2
TRAY FOLEY W/BAG SLVR 14FR LF (SET/KITS/TRAYS/PACK) ×3 IMPLANT
WATER STERILE IRR 1000ML POUR (IV SOLUTION) ×3 IMPLANT

## 2019-05-06 NOTE — Op Note (Signed)
Krystal Hartman PROCEDURE DATE: 05/06/2019  PREOPERATIVE DIAGNOSES: 1. Intrauterine dichorionic and diamniotic twin pregnancy at [redacted]w[redacted]d weeks gestation 2. Chronic hypertension with superimposed severe preeclampsia 3. Previous cesarean section x 3 4. Maternal morbid obesity with BMI of 72 5. Polyhydramnios of both twins 6. Malpresentation of both twins  POSTOPERATIVE DIAGNOSES: The same  PROCEDURE: Repeat Low Transverse Cesarean Section  SURGEON:  Dr. Jaynie Collins  ASSISTANTS:  Dr. Candelaria Celeste and Dr. Dewitt Rota An experienced assistant was required given the standard of surgical care given the complexity and technical difficulties of the case.  This assistant was needed for exposure, dissection, suctioning, retraction, instrument exchange, assisting with delivery with administration of fundal pressure, and for overall help during the procedure  ANESTHESIOLOGY TEAM: Anesthesiologist: Mal Amabile, MD CRNA: Junious Silk, CRNA  INDICATIONS: Krystal Hartman is a 28 y.o. [phone] at [redacted]w[redacted]d here for cesarean section secondary to the indications listed under preoperative diagnoses; please see preoperative note for further details.  The risks of cesarean section were discussed with the patient including but were not limited to: bleeding which may require transfusion or reoperation; infection which may require antibiotics; injury to bowel, bladder, ureters or other surrounding organs; injury to the fetus; need for additional procedures including hysterectomy in the event of a life-threatening hemorrhage; placental abnormalities wth subsequent pregnancies, incisional problems, thromboembolic phenomenon and other postoperative/anesthesia complications.   The patient concurred with the proposed plan, giving informed written consent for the procedure.    FINDINGS:  Viable female infants both delivered in breech presentation with some difficulty given malpresentation for both and habitus  of patient. Nuchal cord x 3 for Twin B. Twin A Apgars 2/8, arterial cord pH 7.19. Weight 2280g.  Twin B Apgars 5/8, weight 2620 g. Copious amount of clear amniotic fluid x 2.  Intact placentas, three vessel cord x 2.  Normal uterus, fallopian tubes and ovaries bilaterally. Some adhesive disease involving omentum and anterior abdominal wall was noted. Also noted impressive edema in the subcutaneous tissues. 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  Procedure was technically difficult secondary to patient's habitus; a lot of retraction of the pannus was needing from pepping until end of the procedure. Negative wound pressure incision dressing (Prevena) was placed at the end of the procedure.  ANESTHESIA: Combined Spinal-Epidural  INTRAVENOUS FLUIDS: 1800 ml   ESTIMATED BLOOD LOSS: 921 ml URINE OUTPUT:  800 ml SPECIMENS: Placentas sent to pathology COMPLICATIONS: None immediate  PROCEDURE IN DETAIL:  The patient preoperatively received intravenous antibiotics and had sequential compression devices applied to her lower extremities.  She was then taken to the operating room where anesthesia was administered and was found to be adequate. She was then placed in a dorsal supine position with a leftward tilt, and prepped and draped in a sterile manner.  A foley catheter was placed into her bladder and attached to constant gravity.  After an adequate timeout was performed, a high transverse skin incision was made with scalpel on her preexisting scar and carried through to the underlying layer of fascia. The fascia was incised in the midline, and this incision was extended bilaterally using the Mayo scissors.  At this point, the peritoneal layer was recognized due to the significant rectus diathesis. The peritoneum was entered sharply and dense omental adhesion was encountered. Blunt entry was made into a clear portion of the adhesion allowing access to the uterus.   The Alexis self-retaining retractor was  introduced into the abdominal cavity.  Attention was turned to  the lower uterine segment where a low transverse hysterotomy was made with a scalpel and extended bilaterally bluntly.  The infants were successfully delivered with difficulty in breech presentations as noted above; both cords were immediately clamped and cut, and the infants were handed over to the awaiting neonatology teams. The placentas delivered intact with a three-vessel cord x 2. The uterus was then cleared of clots and debris.  The hysterotomy was closed with 0 Vicryl in a running locked fashion, and an imbricating layer was also placed with 0 Vicryl.  Figure-of-eight 0 Vicryl serosal stitches were placed to help with hemostasis.  The pelvis was cleared of all clot and debris. There was some bleeding noted on omental adhesion, this was clamped and tied off with 0 Vicryl.  Hemostasis was confirmed on all surfaces.  The retractor was removed.  The fascia was then closed using looped 0 PDS in a running fashion.  The subcutaneous layer was irrigated, reapproximated with 2-0 plain gut interrupted stitches, and the skin was closed with a 4-0 Vicryl subcuticular stitch. After the skin was closed, a Prevena disposable negative pressure wound therapy device was placed over the incision.  The suction was activated at a pressure of 80 mmHg.  The adhesive was affixed well and there were no leaks noted.  The patient tolerated the procedure well. Sponge, instrument and needle counts were correct x 3.  She was taken to the recovery room in stable condition.    Verita Schneiders, MD, [place] for [employer], [place]

## 2019-05-06 NOTE — Progress Notes (Signed)
Dr. Harolyn Rutherford notified of b/ps.  Labetolol protocol ordered.  Repeat b/p 157/89.  Dr. Harolyn Rutherford wants to hold labetolol unless another b/p out of range.

## 2019-05-06 NOTE — Anesthesia Postprocedure Evaluation (Signed)
Anesthesia Post Note  Patient: AZORIA ABBETT  Procedure(s) Performed: CESAREAN SECTION MULTI-GESTATIONAL (N/A Abdomen)     Patient location during evaluation: PACU Anesthesia Type: Spinal Level of consciousness: oriented and awake and alert Pain management: pain level controlled Vital Signs Assessment: post-procedure vital signs reviewed and stable Respiratory status: spontaneous breathing, respiratory function stable and nonlabored ventilation Cardiovascular status: blood pressure returned to baseline and stable Postop Assessment: no headache, no backache, no apparent nausea or vomiting, spinal receding and patient able to bend at knees Anesthetic complications: no    Last Vitals:  Vitals:   05/06/19 1246 05/06/19 1252  BP: (!) 160/87 (!) 157/89  Pulse: 79 80  Resp: (!) 22 (!) 0  Temp:    SpO2: 96% 96%    Last Pain:  Vitals:   05/06/19 1231  TempSrc: Oral  PainSc: 0-No pain   Pain Goal: Patients Stated Pain Goal: 3 (05/06/19 0800)  LLE Motor Response: Purposeful movement (05/06/19 1231)   RLE Motor Response: Purposeful movement (05/06/19 1231)   L Sensory Level: L3-Anterior knee, lower leg (05/06/19 1231) R Sensory Level: L3-Anterior knee, lower leg (05/06/19 1231) Epidural/Spinal Function Cutaneous sensation: Able to Discern Pressure (05/06/19 1231), Patient able to flex knees: No (05/06/19 1231), Patient able to lift hips off bed: No (05/06/19 1231), Back pain beyond tenderness at insertion site: No (05/06/19 1231), Progressively worsening motor and/or sensory loss: No (05/06/19 1231), Bowel and/or bladder incontinence post epidural: No (05/06/19 1231)  Zafira Munos A.

## 2019-05-06 NOTE — Anesthesia Procedure Notes (Signed)
Spinal  Patient location during procedure: OR Start time: 05/06/2019 10:27 AM End time: 05/06/2019 10:36 AM Staffing Anesthesiologist: Josephine Igo, MD Performed: anesthesiologist  Preanesthetic Checklist Completed: patient identified, site marked, surgical consent, pre-op evaluation, timeout performed, IV checked, risks and benefits discussed and monitors and equipment checked Spinal Block Patient position: sitting Prep: site prepped and draped and DuraPrep Patient monitoring: cardiac monitor, continuous pulse ox, blood pressure and heart rate Approach: midline Location: L3-4 Injection technique: catheter Needle Needle type: Tuohy and Spinocan  Needle gauge: 24 G Needle length: 12.7 cm Needle insertion depth: 10 cm Catheter type: closed end flexible Catheter size: 19 g Catheter at skin depth: 15 cm Assessment Sensory level: T3 Additional Notes Epidural performed using LOR with air technique. No CSF, Heme or paresthesias. SAB performed through the epidural needle using 24ga Spinocan needle. CSF clear with free flow and no paresthesias. Local anesthetic and narcotics injected through the spinal needle and withdrawn. Epidural catheter threaded 5cm into the epidural space and the epidural needle was withdrawn. A sterile dressing was applied and the patient placed supine with LUD. The patient tolerated the procedure well and adequate sensory level was obtained.

## 2019-05-06 NOTE — Anesthesia Preprocedure Evaluation (Addendum)
Anesthesia Evaluation  Patient identified by MRN, date of birth, ID band Patient awake    Reviewed: Allergy & Precautions, NPO status , Patient's Chart, lab work & pertinent test results  History of Anesthesia Complications (+) PONV and history of anesthetic complications  Airway Mallampati: IV  TM Distance: >3 FB Neck ROM: Full    Dental no notable dental hx. (+) Teeth Intact   Pulmonary neg pulmonary ROS,    Pulmonary exam normal breath sounds clear to auscultation       Cardiovascular hypertension, Pt. on medications Normal cardiovascular exam Rhythm:Regular Rate:Normal     Neuro/Psych negative neurological ROS  negative psych ROS   GI/Hepatic Neg liver ROS, GERD  ,  Endo/Other  Morbid obesity  Renal/GU negative Renal ROS  negative genitourinary   Musculoskeletal negative musculoskeletal ROS (+)   Abdominal (+) + obese,   Peds  Hematology  (+) anemia ,   Anesthesia Other Findings   Reproductive/Obstetrics (+) Pregnancy Pre eclampsia with severe features Previous C/Section x 2 Di/Di Twin Gestation Pre term 34 4/7 weeks                            Anesthesia Physical Anesthesia Plan  ASA: III  Anesthesia Plan: Combined Spinal and Epidural   Post-op Pain Management:    Induction:   PONV Risk Score and Plan: 4 or greater and Scopolamine patch - Pre-op, Ondansetron and Treatment may vary due to age or medical condition  Airway Management Planned: Natural Airway  Additional Equipment:   Intra-op Plan:   Post-operative Plan:   Informed Consent: I have reviewed the patients History and Physical, chart, labs and discussed the procedure including the risks, benefits and alternatives for the proposed anesthesia with the patient or authorized representative who has indicated his/her understanding and acceptance.     Dental advisory given  Plan Discussed with: CRNA and  Surgeon  Anesthesia Plan Comments:         Anesthesia Quick Evaluation

## 2019-05-06 NOTE — Transfer of Care (Signed)
Immediate Anesthesia Transfer of Care Note  Patient: Krystal Hartman  Procedure(s) Performed: CESAREAN SECTION MULTI-GESTATIONAL (N/A Abdomen)  Patient Location: PACU  Anesthesia Type:Spinal  Level of Consciousness: awake, alert  and oriented  Airway & Oxygen Therapy: Patient Spontanous Breathing  Post-op Assessment: Report given to RN and Post -op Vital signs reviewed and stable  Post vital signs: Reviewed and stable  Last Vitals:  Vitals Value Taken Time  BP    Temp    Pulse 78 05/06/19 1230  Resp 25 05/06/19 1230  SpO2 97 % 05/06/19 1230  Vitals shown include unvalidated device data.  Last Pain:  Vitals:   05/06/19 1001  TempSrc: Oral  PainSc:       Patients Stated Pain Goal: 3 (38/93/73 [id number])  Complications: No apparent anesthesia complications

## 2019-05-06 NOTE — Discharge Summary (Addendum)
Postpartum Discharge Summary    Patient Name: Krystal Hartman DOB: [birth year] MRN: [id number]  Date of admission: 05/05/2019 Delivering Provider:    Clorissa, Gruenberg [[id number]]  Anjelique, Makar [[id number]]  Verita Schneiders A    Date of discharge: 05/09/2019  Admitting diagnosis: 34WKS ELEV BP Intrauterine pregnancy: [redacted]w[redacted]d    Secondary diagnosis:  Principal Problem:   Severe preeclampsia superimposed on chronic hypertension, delivered Active Problems:   Polyhydramnios in third trimester   Anemia complicating pregnancy in third trimester   S/P repeat cesarean section   Supervision of high-risk pregnancy   Hx of preeclampsia, prior pregnancy, currently pregnant   Previous cesarean delivery affecting pregnancy, antepartum   Dichorionic diamniotic twin gestation   Severe preeclampsia, third trimester   M[place] morbid obesity, [place]  Additional problems:None     Discharge diagnosis: Preterm Pregnancy Delivered                                                                                                Post partum procedures: Magnesium sulfate for eclampsia prophylaxis  Augmentation: None  Complications: None  Hospital course:  Sceduled C/S   28y.o. yo [id number] admitted to the hospital 05/05/2019 for scheduled cesarean section with the following indication:repeat elective Cesarean delivery, chronic hypertension with superimposed severe preeclampsia, polyhydramnios of both twins, malpresentation of both twins.  Membrane Rupture Time/Date:    MoBecci, Batty0[[id number]]11:17 AM    MoReshunda, Strider0[[id number]]11:20 AM  ,   MoJaid, Quirion0[[id number]]05/06/2019    MoTennelle, Taflinger0[[id number]]05/06/2019    Patient delivered two Viable infants.   MoIria, Jamerson0[[id number]]05/06/2019    MoAngelisa, Winthrop0[[id number]]05/06/2019   Details of operation can be  found in separate operative note.  Patient had an uncomplicated postpartum course.  She was on magnesium 24 hours post-operatively. BP controlled on Enalapril.  Given her religious beliefs and given that she was breastfeeeding, she could not get heparin-based products given that they are derived from pork. Novel anticoagulants are contraindicated with lactation as they are not enough studies so far.  Fondaparinux 2.5 mg Senath daily was given to her for VTE prophylaxis while she was inpatient.  There was a lot of difficulty getting this approved for her to use on an outpatient basis by her insurance company, but prior authorization was finally approved and covers from 05/08/19-07/07/19.  Prior authorization # [id number], this could not be procured from our system in enough quantity for the recommended six week course. Multiple outpatient pharmacies [facility], and no one had adequate supplies.  Had a long discussion with patient and her husband, they did not want to try Lovenox/Heparin, but she wants to be on Xarelto and knows this is contraindicated with breastfeeding.  She understands the importance of VTE prophylaxis given her comorbidities. She will get her medications [facility] as part of the Meds-To-Beds program so that she can have  anticoagulation for the recommended six weeks postpartum  She is ambulating, tolerating a regular diet, passing flatus, and urinating well. Patient is discharged home in stable condition on  05/09/19        Delivery time:    Celester, Morgan [[id number]]  11:19 AM    Jansen, Goodpasture [[id number]]  11:22 AM    Magnesium Sulfate received: Yes BMZ received: Yes Rhophylac:N/A MMR:No Transfusion:No  Physical exam  Vitals:   05/09/19 0006 05/09/19 0635 05/09/19 0805 05/09/19 1116  BP: (!) 131/49 (!) 157/92 138/77 (!) 145/76  Pulse: 97 86 80 97  Resp: 18 18 18 18   Temp: 98.5 F (36.9 C) 98  F (36.7 C) 98.2 F (36.8 C) 98.8 F (37.1 C)  TempSrc: Oral Oral Oral Oral  SpO2:  98% 99% 100%  Weight:      Height:       General: alert, cooperative and no distress Lochia: appropriate Uterine Fundus: firm, NT, hard to palpate due to habitus Incision: Dressing is clean, dry, and intact/Prevena in place DVT Evaluation: No evidence of DVT seen on physical exam. Labs: Lab Results  Component Value Date   WBC 11.1 (H) 05/07/2019   HGB 8.6 (L) 05/07/2019   HCT 26.9 (L) 05/07/2019   MCV 85.9 05/07/2019   PLT 249 05/07/2019   CMP Latest Ref Rng & Units 05/07/2019  Glucose 70 - 99 mg/dL 126(H)  BUN 6 - 20 mg/dL 9  Creatinine 0.44 - 1.00 mg/dL 0.73  Sodium 135 - 145 mmol/L 133(L)  Potassium 3.5 - 5.1 mmol/L 4.9  Chloride 98 - 111 mmol/L 106  CO2 22 - 32 mmol/L 21(L)  Calcium 8.9 - 10.3 mg/dL 7.6(L)  Total Protein 6.5 - 8.1 g/dL 6.0(L)  Total Bilirubin 0.3 - 1.2 mg/dL 0.5  Alkaline Phos 38 - 126 U/L 61  AST 15 - 41 U/L 25  ALT 0 - 44 U/L 16    Discharge instruction: per After Visit Summary and "Baby and Me Booklet".  After visit meds:  Allergies as of 05/09/2019      Reactions   Latex Hives, Itching   Pork-derived Products Other (See Comments)   Does not eat due to religious reasons.      Medication List    STOP taking these medications   amLODipine 10 MG tablet Commonly known as: Norvasc     TAKE these medications   acetaminophen 325 MG tablet Commonly known as: TYLENOL Take 650 mg by mouth every 6 (six) hours as needed for mild pain or headache.   AMBULATORY NON FORMULARY MEDICATION 1 Device by Other route once a week. Medication Name: Blood Pressure Cuff, extra large Monitored regularly at home ICD 10  O09.90,  O09.299   aspirin EC 81 MG tablet Take 1 tablet (81 mg total) by mouth daily.   docusate sodium 100 MG capsule Commonly known as: Colace Take 1 capsule (100 mg total) by mouth 2 (two) times daily as needed for mild constipation or moderate  constipation. What changed:   when to take this  reasons to take this   ferrous sulfate 325 (65 FE) MG tablet Take 1 tablet (325 mg total) by mouth 2 (two) times daily with a meal.   furosemide 40 MG tablet Commonly known as: LASIX Take 1 tablet (40 mg total) by mouth daily. Start taking on: May 10, 2019   ibuprofen 800 MG tablet Commonly known as: ADVIL Take 1 tablet (800 mg total) by mouth 3 (three) times daily with meals  as needed.   oxyCODONE-acetaminophen 5-325 MG tablet Commonly known as: PERCOCET/ROXICET Take 1 tablet by mouth every 6 (six) hours as needed for severe pain ((when tolerating fluids)).   potassium chloride SA 20 MEQ tablet Commonly known as: KLOR-CON Take 2 tablets (40 mEq total) by mouth daily. Start taking on: May 10, 2019   PRENATAL VITAMINS PO Take 1 tablet by mouth daily.   rivaroxaban 20 MG Tabs tablet Commonly known as: XARELTO Take 1 tablet (20 mg total) by mouth daily. For three weeks after taking 15 mg twice a day for three weeks            Discharge Care Instructions  (From admission, onward)         Start     Ordered   05/09/19 0000  Discharge wound care:    Comments: As per discharge handout and nursing instructions   05/09/19 1042          Diet: routine diet  Activity: Advance as tolerated. Pelvic rest for 6 weeks.   Outpatient follow up:4 weeks Follow up Appt: Future Appointments  Date Time Provider [place]  05/13/2019  9:20 AM [place]  05/21/2019  9:00 AM [place]  06/04/2019 10:55 AM Nehemiah Settle, Tanna Savoy, DO [facility]   Follow up Visit: [place] [facility] Follow up on 05/13/2019.   Specialty: Obstetrics and Gynecology Why: You will be called for appointment details for incision check/Prevena removal, BP check Contact information: [address], [place] [id number] [place] [address] [phone]         Please schedule this patient for Postpartum visit in: 4 weeks with the following provider: MD For C/S patients schedule nurse incision check in weeks 2 weeks: yes High risk pregnancy complicated by: di/di twins, polyhydramnios, severe Pre-E superimposed on chronic hypertension, anemia, morbid obesity Delivery mode:  CS Anticipated Birth Control:  other/unsure PP Procedures needed: Incision check  Schedule Integrated BH visit: no   Newborn Data:   Tanelle, Lanzo [[id number]]  Live born female  Birth Weight: 5 lb 0.4 oz (2280 g) APGAR: 2, 8  Newborn Delivery   Birth date/time: 05/06/2019 11:19:00 Delivery type: C-Section, Low Transverse Trial of labor: No C-section categorization: Repeat       Annie, Saephan [[id number]]  Live born female  Birth Weight: 5 lb 12.4 oz (2620 g) APGAR: 5, 8  Newborn Delivery   Birth date/time: 05/06/2019 11:22:00 Delivery type: C-Section, Low Transverse Trial of labor: No C-section categorization: Repeat      Baby Feeding: Breast Disposition:NICU   05/09/2019 Verita Schneiders, MD

## 2019-05-06 NOTE — Interval H&P Note (Signed)
History and Physical Interval Note 05/06/2019 9:24 AM   Krystal Hartman was admitted yesterday, scheduled for delivery today given diagnosis of CHTN with superimposed severe preeclampsia at [redacted]w[redacted]d with twin gestation.   No acute events overnight. No further antihypertensives administered other the ones administered in MAU. Krystal Hartman denies any headaches, visual symptoms, RUQ/epigastric pain or other concerning symptoms. Reports good fetal movement x2, no VB, no LOF or contractions.  Krystal Hartman Vitals for the past 24 hrs:  BP Temp Temp src Pulse Resp SpO2 Height Weight  05/06/19 0900 - - - - 18 - - -  05/06/19 0800 (!) 149/92 97.8 F (36.6 C) Oral 96 18 96 % - -  05/06/19 0702 - - - - 18 - - -  05/06/19 0600 - - - - 18 - - -  05/06/19 0519 (!) 159/91 - - 97 - - - -  05/06/19 0500 - - - - 18 - - -  05/06/19 0405 - - - - 19 - - -  05/06/19 0300 - - - - 18 - - -  05/06/19 0200 - - - - 18 - - -  05/06/19 0050 (!) 148/84 - - 100 18 98 % - -  05/05/19 2331 136/86 - - (!) 103 19 97 % - -  05/05/19 2242 (!) 151/82 - - 96 18 97 % - -  05/05/19 2103 (!) 152/89 - - (!) 106 18 96 % - -  05/05/19 1959 (!) 147/84 98.6 F (37 C) - 100 19 98 % - -  05/05/19 1803 (!) 155/95 98.2 F (36.8 C) Oral 91 18 99 % - -  05/05/19 1725 - - - - - 98 % - -  05/05/19 1720 - - - - - 98 % - -  05/05/19 1715 - - - - - 98 % - -  05/05/19 1710 - - - - - 99 % - -  05/05/19 1705 - - - - - 99 % - -  05/05/19 1700 - - - - - 99 % - -  05/05/19 1657 (!) 148/93 98.2 F (36.8 C) Oral 96 18 99 % - -  05/05/19 1645 - - - - - 98 % - -  05/05/19 1641 - - - - - 98 % - -  05/05/19 1640 - - - - - 98 % - -  05/05/19 1638 (!) 147/89 - - 93 18 - - -  05/05/19 1635 - - - - - 99 % - -  05/05/19 1630 - - - - - 98 % - -  05/05/19 1626 (!) 158/102 - - 96 - 99 % - -  05/05/19 1620 - - - - - 99 % - -  05/05/19 1615 - - - - - 100 % - -  05/05/19 1613 (!) 161/92 - - 92 - - - -  05/05/19 1611 (!) 161/100 - - 88 - - - -  05/05/19 1556 (!) 154/95 -  - 94 - - - -  05/05/19 1552 (!) 204/144 - - 93 - - - -  05/05/19 1541 (!) 181/109 - - (!) 101 - - - -  05/05/19 1519 (!) 170/100 - - 97 - - - -  05/05/19 1501 (!) 160/99 - - (!) 103 - - - -  05/05/19 1500 - - - - - 99 % - -  05/05/19 1450 - - - - - 99 % - -  05/05/19 1446 (!) 143/91 - - (!) 108 - - - -  05/05/19 1445 - - - - - 99 % - -  05/05/19 1440 - - - - - 99 % - -  05/05/19 1437 (!) 152/86 - - (!) 102 17 - - -  05/05/19 1435 - - - - - 98 % - -  05/05/19 1430 - - - - - 99 % - -  05/05/19 1425 - - - - - 99 % - -  05/05/19 1422 (!) 150/95 - - (!) 107 - - - -  05/05/19 1407 (!) 142/89 - - (!) 110 - - - -  05/05/19 1354 (!) 144/95 - - (!) 115 - - - -  05/05/19 1336 (!) 172/97 98.2 F (36.8 C) Oral (!) 104 20 98 % 5\' 6"  (1.676 m) (!) 203 kg  FHR tracing reactive and reassuring x 2  CMP Latest Ref Rng & Units 05/06/2019 05/05/2019 12/02/2018  Glucose 70 - 99 mg/dL 12/04/2018) 84 74  BUN 6 - 20 mg/dL 8 6 8   Creatinine 0.44 - 1.00 mg/dL 175(Z 0.25  Sodium 135 - 145 mmol/L 134(L) 137 133(L)  Potassium 3.5 - 5.1 mmol/L 4.2 3.9 3.8  Chloride 98 - 111 mmol/L 106 110 100  CO2 22 - 32 mmol/L 17(L) 18(L) 17(L)  Calcium 8.9 - 10.3 mg/dL 8.2(L) 8.8(L) 8.9  Total Protein 6.5 - 8.1 g/dL 6.8 7.3 8.0  Total Bilirubin 0.3 - 1.2 mg/dL 0.4 8.52) 0.3  Alkaline Phos 38 - 126 U/L 77 83 76  AST 15 - 41 U/L 17 17 14   ALT 0 - 44 U/L 12 12 11    CBC Latest Ref Rng & Units 05/06/2019 05/05/2019 02/25/2019  WBC 4.0 - 10.5 K/uL 6.2 8.0 6.8  Hemoglobin 12.0 - 15.0 g/dL ) 10.7(L) 8.7(L)  Hematocrit 36.0 - 46.0 % 29.6(L) 32.3(L) 27.4(L)  Platelets 150 - 400 K/uL 238 228 293  She is typed and crossmatched for 3 units. TXA and Cefotetan ordered on call to OR.  She has been seen by Neonatology and Anesthesiology teams, appreciate their input.  Second dose of betamethasone given today.  Krystal Hartman has been consented for cesarean section. Risks re-reviewed, Krystal Hartman also told about possibility of needing vertical  abdominal incision given her habitus. Will evaluate in OR. She declined placement of IUD or other contraception at this point; discussed increased risk of having further pregnancies and adverse maternal-fetal risks given her current conditions.  After consideration of risks, benefits and other options for treatment, the Krystal Hartman has consented to  REPEAT CESAREAN SECTION MULTI-GESTATIONAL (N/A) as a surgical intervention.  The Krystal Hartman's history has been reviewed, Krystal Hartman examined, no change in status, stable for surgery.  I have reviewed the Krystal Hartman's chart and labs.  Questions were answered to the Krystal Hartman's satisfaction.  To OR when ready.   05/08/2019, MD, FACOG Obstetrician & Gynecologist, [facility] for 04/27/2019, [facility]

## 2019-05-07 LAB — COMPREHENSIVE METABOLIC PANEL
ALT: 16 U/L (ref 0–44)
AST: 25 U/L (ref 15–41)
Albumin: 2.2 g/dL — ABNORMAL LOW (ref 3.5–5.0)
Alkaline Phosphatase: 61 U/L (ref 38–126)
Anion gap: 6 (ref 5–15)
BUN: 9 mg/dL (ref 6–20)
CO2: 21 mmol/L — ABNORMAL LOW (ref 22–32)
Calcium: 7.6 mg/dL — ABNORMAL LOW (ref 8.9–10.3)
Chloride: 106 mmol/L (ref 98–111)
Creatinine, Ser: 0.73 mg/dL (ref 0.44–1.00)
GFR calc Af Amer: >60 mL/min (ref >60)
GFR calc non Af Amer: >60 mL/min (ref >60)
Glucose, Bld: 126 mg/dL — ABNORMAL HIGH (ref 70–99)
Potassium: 4.9 mmol/L (ref 3.5–5.1)
Sodium: 133 mmol/L — ABNORMAL LOW (ref 135–145)
Total Bilirubin: 0.5 mg/dL (ref 0.3–1.2)
Total Protein: 6 g/dL — ABNORMAL LOW (ref 6.5–8.1)

## 2019-05-07 LAB — CBC
HCT: 26.9 % — ABNORMAL LOW (ref 36.0–46.0)
Hemoglobin: 8.6 g/dL — ABNORMAL LOW (ref 12.0–15.0)
MCH: 27.5 pg (ref 26.0–34.0)
MCHC: 32 g/dL (ref 30.0–36.0)
MCV: 85.9 fL (ref 80.0–100.0)
Platelets: 249 10*3/uL (ref 150–400)
RBC: 3.13 MIL/uL — ABNORMAL LOW (ref 3.87–5.11)
RDW: 18.4 % — ABNORMAL HIGH (ref 11.5–15.5)
WBC: 11.1 10*3/uL — ABNORMAL HIGH (ref 4.0–10.5)
nRBC: 0 % (ref 0.0–0.2)

## 2019-05-07 LAB — MAGNESIUM: Magnesium: 4.7 mg/dL — ABNORMAL HIGH (ref 1.7–2.4)

## 2019-05-07 MED ORDER — FONDAPARINUX SODIUM 2.5 MG/0.5ML ~~LOC~~ SOLN: 2.5000 mg | SUBCUTANEOUS | 0 refills | Status: DC

## 2019-05-07 MED ORDER — ENALAPRIL MALEATE 5 MG PO TABS
10.0000 mg | ORAL_TABLET | Freq: Every day | ORAL | Status: DC
  Administered 2019-05-07: 10 mg via ORAL
  Filled 2019-05-07: qty 2

## 2019-05-07 MED ORDER — DIPHENHYDRAMINE HCL 25 MG PO CAPS
25.0000 mg | ORAL_CAPSULE | Freq: Once | ORAL | Status: AC
  Administered 2019-05-07: 25 mg via ORAL
  Filled 2019-05-07: qty 1

## 2019-05-07 MED ORDER — SODIUM CHLORIDE 0.9 % IV SOLN
510.0000 mg | Freq: Once | INTRAVENOUS | Status: AC
  Administered 2019-05-07: 510 mg via INTRAVENOUS
  Filled 2019-05-07: qty 17

## 2019-05-07 NOTE — Lactation Note (Addendum)
This note was copied from a baby's chart. Lactation Consultation Note  Patient Name: Krystal Hartman [id number] Date: 05/07/2019 Reason for consult: Initial assessment;Late-preterm 34-36.6wks;Infant < 6lbs;Multiple gestation;NICU baby  LC in to visit with P5 Mom of twins born at [redacted]w[redacted]d due to [facility].  Mom on MgSO4 currently, but plans to DC this afternoon.   Mom was set up with a DEBP and has been pumping both breasts on initiation setting every 3 hrs.  LC disassembled pump parts and washed, rinsed and placed in separate bin to dry, explaining to Mom the importance of washing parts after each pumping.  Mom has EBM labels for the babies.   Reviewed basics of pumping (8-12 times per 24 hrs) and breast massage and hand expression.  Colostrum containers given and placed in overbed tray.  Mom states she was shown how to hand express and denies needing assistance with this.    NICU booklet and Lactation brochure given to Mom and encouraged her to read through.  Praised Mom for providing EBM for her babies.  Mom has a DEBP at home, and Mom aware of Medela Symphony pump in baby's room and importance of holding on to her pump parts.  Mom being taken to NICU to hold babies.  Encouraged Mom to pump once she is back in room.   Mom aware of IP and OP lactation support available to her.  Faxed [facility] referral for pump at discharge.  Interventions Interventions: Breast feeding basics reviewed;Skin to skin;Breast massage;Hand express;DEBP  Lactation Tools Discussed/Used Tools: Pump Breast pump type: Double-Electric Breast Pump Pump Review: Setup, frequency, and cleaning;Milk Storage Initiated by:: [place] RN Date initiated:: 05/06/19   Consult Status Consult Status: Follow-up Date: 05/08/19 Follow-up type: In-patient    Broadus John 05/07/2019, 8:22 AM

## 2019-05-07 NOTE — Progress Notes (Signed)
Postpartum Day 1: Cesarean Delivery of twins at [redacted]w[redacted]d for [facility] with superimposed severe PEC.  Subjective: Patient denies any headaches, visual symptoms, RUQ/epigastric pain or other concerning symptoms.  Patient reports incisional pain, tolerating PO and no problems voiding.  No flatus yet. Breastfeeding. No problems with OOB, ambulating, no presyncopal symptoms. Babies are stable in NICU.  Objective: Vital signs in last 24 hours: Temp:  [97.6 F (36.4 C)-98.5 F (36.9 C)] 98.5 F (36.9 C) (11/18 0801) Pulse Rate:  [70-98] 78 (11/18 0801) Resp:  [0-32] 20 (11/18 1106) BP: (138-161)/(75-99) 155/99 (11/18 0801) SpO2:  [93 %-99 %] 99 % (11/18 0801) Patient Vitals for the past 24 hrs:  BP Temp Temp src Pulse Resp SpO2  05/07/19 1106 - - - - 20 -  05/07/19 1005 - - - - 20 -  05/07/19 0900 - - - - 20 -  05/07/19 0801 (!) 155/99 98.5 F (36.9 C) Oral 78 20 99 %  05/07/19 0600 - - - - 19 -  05/07/19 0536 - - - - 18 -  05/07/19 0420 (!) 145/91 97.8 F (36.6 C) Oral 79 19 96 %  05/07/19 0330 - - - - 18 -  05/07/19 0230 - - - - 18 -  05/07/19 0130 - - - - 18 -  05/06/19 2323 138/79 98.1 F (36.7 C) Oral 91 18 99 %  05/06/19 2105 - - - - 18 97 %  05/06/19 2100 - - - - - 97 %  05/06/19 1921 (!) 145/81 98.3 F (36.8 C) Oral 91 18 96 %  05/06/19 1900 - - - - 20 -  05/06/19 1750 - - - - - 95 %  05/06/19 1748 (!) 157/85 97.9 F (36.6 C) Oral 87 20 97 %  05/06/19 1745 - - - - - 96 %  05/06/19 1740 - - - - - 98 %  05/06/19 1735 - - - - - 97 %  05/06/19 1730 - - - - - 97 %  05/06/19 1725 - - - - - 97 %  05/06/19 1720 - - - - - 98 %  05/06/19 1715 - - - - - 96 %  05/06/19 1710 - - - - - 96 %  05/06/19 1705 - - - - - 96 %  05/06/19 1700 - - - - - 95 %  05/06/19 1655 - - - - - 96 %  05/06/19 1650 - - - - - 96 %  05/06/19 1640 (!) 155/88 98.1 F (36.7 C) Oral 94 (!) 21 97 %  05/06/19 1635 - - - - - 93 %  05/06/19 1630 - - - - 20 94 %  05/06/19 1625 - - - - - 95 %  05/06/19 1620 - -  - - - 95 %  05/06/19 1615 - - - - - 97 %  05/06/19 1610 - - - - - 96 %  05/06/19 1605 - - - - - 94 %  05/06/19 1600 - - - - - 95 %  05/06/19 1555 - - - - - 96 %  05/06/19 1550 - - - - - 97 %  05/06/19 1545 - - - - - 96 %  05/06/19 1540 - - - - - 96 %  05/06/19 1535 - - - - - 96 %  05/06/19 1530 - - - - - 98 %  05/06/19 1526 (!) 158/91 97.8 F (36.6 C) Oral 98 20 98 %  05/06/19 1525 - - - - -  97 %  05/06/19 1520 - - - - - 95 %  05/06/19 1515 - - - - - 96 %  05/06/19 1510 - - - - - 95 %  05/06/19 1505 - - - - - 95 %  05/06/19 1500 - - - - - 96 %  05/06/19 1455 - - - - - 94 %  05/06/19 1450 - - - - - 94 %  05/06/19 1445 - - - - - 94 %  05/06/19 1440 - - - - - 97 %  05/06/19 1435 - - - - - 94 %  05/06/19 1430 - - - - - 97 %  05/06/19 1425 - - - - - 96 %  05/06/19 1420 - - - - - 97 %  05/06/19 1415 - - - - - 95 %  05/06/19 1410 - - - - - 95 %  05/06/19 1405 - - - - - 96 %  05/06/19 1352 (!) 155/92 97.6 F (36.4 C) Oral 78 20 97 %  05/06/19 1331 - - - 79 (!) 25 94 %  05/06/19 1330 (!) 146/83 98.4 F (36.9 C) Oral 73 (!) 21 95 %  05/06/19 1329 - - - 75 19 95 %  05/06/19 1328 - - - 74 (!) 31 94 %  05/06/19 1327 - - - 71 (!) 27 94 %  05/06/19 1326 - - - 76 17 93 %  05/06/19 1325 - - - 77 (!) 0 93 %  05/06/19 1324 - - - 75 (!) 0 94 %  05/06/19 1323 - - - 76 16 96 %  05/06/19 1322 - - - 74 14 96 %  05/06/19 1321 - - - 88 20 96 %  05/06/19 1320 - - - 70 11 96 %  05/06/19 1319 - - - 73 - 96 %  05/06/19 1318 - - - 73 20 98 %  05/06/19 1317 - - - 75 12 96 %  05/06/19 1316 - - - 72 (!) 0 94 %  05/06/19 1315 (!) 156/92 - - 74 11 95 %  05/06/19 1314 - - - 72 17 95 %  05/06/19 1313 - - - 84 (!) 23 95 %  05/06/19 1312 - - - 74 16 97 %  05/06/19 1311 - - - 73 16 96 %  05/06/19 1310 - - - 71 (!) 0 95 %  05/06/19 1309 - - - 77 11 97 %  05/06/19 1308 - - - 75 (!) 8 95 %  05/06/19 1307 - - - 75 14 94 %  05/06/19 1306 - - - 72 10 95 %  05/06/19 1305 - - - 71 (!) 32 96 %  05/06/19  1304 - - - 79 (!) 24 97 %  05/06/19 1303 - - - 79 (!) 30 95 %  05/06/19 1302 - - - 73 (!) 21 96 %  05/06/19 1301 - - - 78 (!) 25 95 %  05/06/19 1300 (!) 156/90 - - 81 19 98 %  05/06/19 1252 (!) 157/89 - - 80 (!) 0 96 %  05/06/19 1246 (!) 160/87 - - 79 (!) 22 96 %  05/06/19 1231 (!) 161/75 97.7 F (36.5 C) Oral 81 (!) 25 98 %    Physical Exam:  General: alert and morbidly obese Lochia: appropriate Uterine Fundus: firm, NT Incision: Prevena in place DVT Evaluation: No evidence of DVT seen on physical exam. Negative Homan's sign. No cords or calf tenderness. No significant calf/ankle edema.  Recent Labs    05/06/19 1756 05/07/19 0532  HGB 9.7* 8.6*  HCT 29.6* 26.9*    Assessment/Plan: Status post Cesarean section. Doing well postoperatively.  - Started Enalapril for BP control, continue Lasix for postop diuresis - Feraheme ordered for anemia - Analgesia as needed - Continue current care.  Jaynie Collins, MD 05/07/2019, 11:15 AM

## 2019-05-07 NOTE — Progress Notes (Signed)
Patient screened out for psychosocial assessment since none of the following apply:  Psychosocial stressors documented in mother or baby's chart  Gestation less than 32 weeks  Code at delivery   Infant with anomalies Please contact the Clinical Social Worker if specific needs arise, by MOB's request, or if MOB scores greater than 9/yes to question 10 on Edinburgh Postpartum Depression Screen.  Shreya Lacasse, LCSW Clinical Social Worker [facility] Cell#: [phone]     

## 2019-05-08 MED ORDER — ENALAPRIL MALEATE 5 MG PO TABS
20.0000 mg | ORAL_TABLET | Freq: Every day | ORAL | Status: DC
  Administered 2019-05-08 – 2019-05-09 (×2): 20 mg via ORAL
  Filled 2019-05-08 (×2): qty 4

## 2019-05-08 NOTE — Progress Notes (Signed)
Postpartum Day 2: Cesarean Delivery of twins at [redacted]w[redacted]d for [facility] with superimposed severe PEC.  Subjective: Patient denies any headaches, visual symptoms, RUQ/epigastric pain or other concerning symptoms.  Patient reports mild incisional pain, but is tolerating PO and no problems voiding.  Had flatus and BM. Breastfeeding. No problems with OOB, ambulating, no presyncopal symptoms. Babies are stable in NICU.  Objective: Vital signs in last 24 hours: Temp:  [98 F (36.7 C)-98.5 F (36.9 C)] 98 F (36.7 C) (11/19 0347) Pulse Rate:  [71-81] 78 (11/19 0347) Resp:  [18-20] 18 (11/19 0347) BP: (137-157)/(44-88) 149/86 (11/19 0347) SpO2:  [98 %-100 %] 98 % (11/19 0347) Patient Vitals for the past 24 hrs:  BP Temp Temp src Pulse Resp SpO2  05/08/19 0347 (!) 149/86 98 F (36.7 C) Oral 78 18 98 %  05/08/19 0010 (!) 139/44 98.5 F (36.9 C) Oral 81 18 100 %  05/07/19 1958 137/64 98.5 F (36.9 C) Oral 73 18 99 %  05/07/19 1642 (!) 145/71 98.5 F (36.9 C) Oral 71 18 99 %  05/07/19 1148 (!) 157/88 98.5 F (36.9 C) Oral 79 20 99 %  05/07/19 1106 - - - - 20 -  05/07/19 1005 - - - - 20 -  05/07/19 0900 - - - - 20 -   Physical Exam:  General: alert and morbidly obese Lochia: appropriate Uterine Fundus: firm, NT Incision: Prevena in place DVT Evaluation: No evidence of DVT seen on physical exam. Negative Homan's sign. No cords or calf tenderness. No significant calf/ankle edema.  Recent Labs    05/06/19 1756 05/07/19 0532  HGB 9.7* 8.6*  HCT 29.6* 26.9*    Assessment/Plan: Status post Cesarean section. Doing well postoperatively.  - Increased Enalapril to 20 mg for BP control, continue Lasix for postop diuresis - S/P Feraheme ordered for anemia - Analgesia as needed - Still undecided about BCM, reports abstinence given [place]. Talked about high risk nature and risk of complications with subsequent pregnancies. - Continue current  care.  Verita Schneiders, MD 05/08/2019, 8:17 AM

## 2019-05-08 NOTE — Lactation Note (Signed)
This note was copied from a baby's chart. Lactation Consultation Note: LC to do follow up visit and mother was in the NICU with twins.   Patient Name: Krystal Hartman [id number]'[id number] Date: 05/08/2019     Maternal Data    Feeding Feeding Type: Donor Breast Milk  LATCH Score                   Interventions    Lactation Tools Discussed/Used     Consult Status      Darla Lesches 05/08/2019, 2:23 PM

## 2019-05-08 NOTE — Progress Notes (Signed)
Prevena dressing suction and seal not intact. Dressing changed by Dr Darene Lamer and Bradd Burner, ST

## 2019-05-08 NOTE — Lactation Note (Signed)
This note was copied from a baby's chart. Lactation Consultation Note  Patient Name: [name] [id number] Date: 05/08/2019 Reason for consult: Follow-up assessment;Late-preterm 34-36.6wks;Multiple gestation;NICU baby Mom reports she is pumping colostrum containers of breastmilk now.Getting colostrum just about everytime she pumps now.   Mom is a p5.  .  These are her 4th and 5th. First baby breastfed 2 months.  Second baby 4 months.  Last baby only while in hospital because he had a lactose intolerance per mom.   Mom reports  [place] DEBP for home use. Reports WIC called her today but she told them she had a DEBP.Urged her to use DEBP from [facility].  Explained that is was for pumping for NICu infant and Lher pump was more going back to work or school.  Mom reports she will think on it. Praised pumping.  Urged to call lactation as needed.     Maternal Data    Feeding Feeding Type: Donor Breast Milk  LATCH Score                   Interventions    Lactation Tools Discussed/Used WIC Program: Yes   Consult Status Consult Status: Follow-up Date: 05/09/19 Follow-up type: In-patient    [facility] Caul 05/08/2019, 5:55 PM

## 2019-05-09 LAB — TYPE AND SCREEN
ABO/RH(D): A POS
Antibody Screen: NEGATIVE
Unit division: 0
Unit division: 0
Unit division: 0

## 2019-05-09 LAB — BPAM RBC
Blood Product Expiration Date: 202012122359
Blood Product Expiration Date: 202012122359
Blood Product Expiration Date: 202012132359
Unit Type and Rh: 6200
Unit Type and Rh: 6200
Unit Type and Rh: 6200

## 2019-05-09 MED ORDER — DOCUSATE SODIUM 100 MG PO CAPS: 100.0000 mg | ORAL_CAPSULE | Freq: Two times a day (BID) | ORAL | 2 refills | Status: DC | PRN

## 2019-05-09 MED ORDER — RIVAROXABAN 20 MG PO TABS: 20.0000 mg | ORAL_TABLET | Freq: Every day | ORAL | 0 refills | Status: DC

## 2019-05-09 MED ORDER — POTASSIUM CHLORIDE CRYS ER 20 MEQ PO TBCR: 40.0000 meq | EXTENDED_RELEASE_TABLET | Freq: Every day | ORAL | 1 refills | Status: DC

## 2019-05-09 MED ORDER — FUROSEMIDE 40 MG PO TABS: 40.0000 mg | ORAL_TABLET | Freq: Every day | ORAL | 0 refills | Status: DC

## 2019-05-09 MED ORDER — IBUPROFEN 800 MG PO TABS: 800.0000 mg | ORAL_TABLET | Freq: Three times a day (TID) | ORAL | 2 refills | Status: DC | PRN

## 2019-05-09 MED ORDER — OXYCODONE-ACETAMINOPHEN 5-325 MG PO TABS: 1.0000 | ORAL_TABLET | Freq: Four times a day (QID) | ORAL | 0 refills | Status: DC | PRN

## 2019-05-09 MED ORDER — FERROUS SULFATE 325 (65 FE) MG PO TABS: 325.0000 mg | ORAL_TABLET | Freq: Two times a day (BID) | ORAL | 3 refills | Status: DC

## 2019-05-09 MED FILL — OXYCODONE-ACETAMINOPHEN 5-3: 5-325 | 7 days supply | Qty: 30 | Fill #0

## 2019-05-09 MED FILL — DOK 100 MG CAPS: 100 | 15 days supply | Qty: 30 | Fill #0

## 2019-05-09 MED FILL — POTASSIUM CL ER 20 MEQ TABL: 20 | 5 days supply | Qty: 10 | Fill #0

## 2019-05-09 MED FILL — IBUPROFEN 800 MG TAB: 800 | 10 days supply | Qty: 30 | Fill #0

## 2019-05-09 MED FILL — FUROSEMIDE 40 MG TABLET: 40 | 10 days supply | Qty: 10 | Fill #0

## 2019-05-09 MED FILL — FERROUS SULFATE 325 MG TAB: 325 (65 FE) | 30 days supply | Qty: 60 | Fill #0

## 2019-05-09 MED FILL — XARELTO 20 MG TABLET: 20 | 30 days supply | Qty: 30 | Fill #0

## 2019-05-09 NOTE — Discharge Instructions (Addendum)
Cesarean Delivery, Care After This sheet gives you information about how to care for yourself after your procedure. Your health care provider may also give you more specific instructions. If you have problems or questions, contact your health care provider. What can I expect after the procedure? After the procedure, it is common to have:  A small amount of blood or clear fluid coming from the incision.  Some redness, swelling, and pain in your incision area.  Some abdominal pain and soreness.  Vaginal bleeding (lochia). Even though you did not have a vaginal delivery, you will still have vaginal bleeding and discharge.  Pelvic cramps.  Fatigue. You may have pain, swelling, and discomfort in the tissue between your vagina and your anus (perineum) if:  Your C-section was unplanned, and you were allowed to labor and push.  An incision was made in the area (episiotomy) or the tissue tore during attempted vaginal delivery. Follow these instructions at home: Incision care   Follow instructions from your health care provider about how to take care of your incision. Make sure you: ? Wash your hands with soap and water before you change your bandage (dressing). If soap and water are not available, use hand sanitizer. ? If you have a dressing, change it or remove it as told by your health care provider. ? Leave stitches (sutures), skin staples, skin glue, or adhesive strips in place. These skin closures may need to stay in place for 2 weeks or longer. If adhesive strip edges start to loosen and curl up, you may trim the loose edges. Do not remove adhesive strips completely unless your health care provider tells you to do that.  Check your incision area every day for signs of infection. Check for: ? More redness, swelling, or pain. ? More fluid or blood. ? Warmth. ? Pus or a bad smell.  Do not take baths, swim, or use a hot tub until your health care provider says it's okay. Ask your health  care provider if you can take showers.  When you cough or sneeze, hug a pillow. This helps with pain and decreases the chance of your incision opening up (dehiscing). Do this until your incision heals. Medicines  Take over-the-counter and prescription medicines only as told by your health care provider.  If you were prescribed an antibiotic medicine, take it as told by your health care provider. Do not stop taking the antibiotic even if you start to feel better.  Do not drive or use heavy machinery while taking prescription pain medicine. Lifestyle  Do not drink alcohol. This is especially important if you are breastfeeding or taking pain medicine.  Do not use any products that contain nicotine or tobacco, such as cigarettes, e-cigarettes, and chewing tobacco. If you need help quitting, ask your health care provider. Eating and drinking  Drink at least 8 eight-ounce glasses of water every day unless told not to by your health care provider. If you breastfeed, you may need to drink even more water.  Eat high-fiber foods every day. These foods may help prevent or relieve constipation. High-fiber foods include: ? Whole grain cereals and breads. ? Brown rice. ? Beans. ? Fresh fruits and vegetables. Activity   If possible, have someone help you care for your baby and help with household activities for at least a few days after you leave the hospital.  Return to your normal activities as told by your health care provider. Ask your health care provider what activities are safe for  you.  Rest as much as possible. Try to rest or take a nap while your baby is sleeping.  Do not lift anything that is heavier than 10 lbs (4.5 kg), or the limit that you were told, until your health care provider says that it is safe.  Talk with your health care provider about when you can engage in sexual activity. This may depend on your: ? Risk of infection. ? How fast you heal. ? Comfort and desire to  engage in sexual activity. General instructions  Do not use tampons or douches until your health care provider approves.  Wear loose, comfortable clothing and a supportive and well-fitting bra.  Keep your perineum clean and dry. Wipe from front to back when you use the toilet.  If you pass a blood clot, save it and call your health care provider to discuss. Do not flush blood clots down the toilet before you get instructions from your health care provider.  Keep all follow-up visits for you and your baby as told by your health care provider. This is important. Contact a health care provider if:  You have: ? A fever. ? Bad-smelling vaginal discharge. ? Pus or a bad smell coming from your incision. ? Difficulty or pain when urinating. ? A sudden increase or decrease in the frequency of your bowel movements. ? More redness, swelling, or pain around your incision. ? More fluid or blood coming from your incision. ? A rash. ? Nausea. ? Little or no interest in activities you used to enjoy. ? Questions about caring for yourself or your baby.  Your incision feels warm to the touch.  Your breasts turn red or become painful or hard.  You feel unusually sad or worried.  You vomit.  You pass a blood clot from your vagina.  You urinate more than usual.  You are dizzy or light-headed. Get help right away if:  You have: ? Pain that does not go away or get better with medicine. ? Chest pain. ? Difficulty breathing. ? Blurred vision or spots in your vision. ? Thoughts about hurting yourself or your baby. ? New pain in your abdomen or in one of your legs. ? A severe headache.  You faint.  You bleed from your vagina so much that you fill more than one sanitary pad in one hour. Bleeding should not be heavier than your heaviest period. Summary  After the procedure, it is common to have pain at your incision site, abdominal cramping, and slight bleeding from your vagina.  Check  your incision area every day for signs of infection.  Tell your health care provider about any unusual symptoms.  Keep all follow-up visits for you and your baby as told by your health care provider. This information is not intended to replace advice given to you by your health care provider. Make sure you discuss any questions you have with your health care provider. Document Released: 02/25/2002 Document Revised: 12/12/2017 Document Reviewed: 12/12/2017 Elsevier Patient Education  2020 [employer].    Postpartum Hypertension Postpartum hypertension is high blood pressure that remains higher than normal after childbirth. You may not realize that you have postpartum hypertension if your blood pressure is not being checked regularly. In most cases, postpartum hypertension will go away on its own, usually within a week of delivery. However, for some women, medical treatment is required to prevent serious complications, such as seizures or stroke. What are the causes? This condition may be caused by one or  more of the following:  Hypertension that existed before pregnancy (chronic hypertension).  Hypertension that comes on as a result of pregnancy (gestational hypertension).  Hypertensive disorders during pregnancy (preeclampsia) or seizures in women who have high blood pressure during pregnancy (eclampsia).  A condition in which the liver, platelets, and red blood cells are damaged during pregnancy (HELLP syndrome).  A condition in which the thyroid produces too much hormones (hyperthyroidism).  Other rare problems of the nerves (neurological disorders) or blood disorders. In some cases, the cause may not be known. What increases the risk? The following factors may make you more likely to develop this condition:  Chronic hypertension. In some cases, this may not have been diagnosed before pregnancy.  Obesity.  Type 2 diabetes.  Kidney disease.  History of preeclampsia or  eclampsia.  Other medical conditions that change the level of hormones in the body (hormonal imbalance). What are the signs or symptoms? As with all types of hypertension, postpartum hypertension may not have any symptoms. Depending on how high your blood pressure is, you may experience:  Headaches. These may be mild, moderate, or severe. They may also be steady, constant, or sudden in onset (thunderclap headache).  Changes in your ability to see (visual changes).  Dizziness.  Shortness of breath.  Swelling of your hands, feet, lower legs, or face. In some cases, you may have swelling in more than one of these locations.  Heart palpitations or a racing heartbeat.  Difficulty breathing while lying down.  Decrease in the amount of urine that you pass. Other rare signs and symptoms may include:  Sweating more than usual. This lasts longer than a few days after delivery.  Chest pain.  Sudden dizziness when you get up from sitting or lying down.  Seizures.  Nausea or vomiting.  Abdominal pain. How is this diagnosed? This condition may be diagnosed based on the results of a physical exam, blood pressure measurements, and blood and urine tests. You may also have other tests, such as a CT scan or an MRI, to check for other problems of postpartum hypertension. How is this treated? If blood pressure is high enough to require treatment, your options may include:  Medicines to reduce blood pressure (antihypertensives). Tell your health care provider if you are breastfeeding or if you plan to breastfeed. There are many antihypertensive medicines that are safe to take while breastfeeding.  Stopping medicines that may be causing hypertension.  Treating medical conditions that are causing hypertension.  Treating the complications of hypertension, such as seizures, stroke, or kidney problems. Your health care provider will also continue to monitor your blood pressure closely until it is  within a safe range for you. Follow these instructions at home:  Take over-the-counter and prescription medicines only as told by your health care provider.  Return to your normal activities as told by your health care provider. Ask your health care provider what activities are safe for you.  Do not use any products that contain nicotine or tobacco, such as cigarettes and e-cigarettes. If you need help quitting, ask your health care provider.  Keep all follow-up visits as told by your health care provider. This is important. Contact a health care provider if:  Your symptoms get worse.  You have new symptoms, such as: ? A headache that does not get better. ? Dizziness. ? Visual changes. Get help right away if:  You suddenly develop swelling in your hands, ankles, or face.  You have sudden, rapid weight gain.  You develop difficulty breathing, chest pain, racing heartbeat, or heart palpitations.  You develop severe pain in your abdomen.  You have any symptoms of a stroke. "BE FAST" is an easy way to remember the main warning signs of a stroke: ? B - Balance. Signs are dizziness, sudden trouble walking, or loss of balance. ? E - Eyes. Signs are trouble seeing or a sudden change in vision. ? F - Face. Signs are sudden weakness or numbness of the face, or the face or eyelid drooping on one side. ? A - Arms. Signs are weakness or numbness in an arm. This happens suddenly and usually on one side of the body. ? S - Speech. Signs are sudden trouble speaking, slurred speech, or trouble understanding what people say. ? T - Time. Time to call emergency services. Write down what time symptoms started.  You have other signs of a stroke, such as: ? A sudden, severe headache with no known cause. ? Nausea or vomiting. ? Seizure. These symptoms may represent a serious problem that is an emergency. Do not wait to see if the symptoms will go away. Get medical help right away. Call your local  emergency services (911 in the [place] Do not drive yourself to the hospital. Summary  Postpartum hypertension is high blood pressure that remains higher than normal after childbirth.  In most cases, postpartum hypertension will go away on its own, usually within a week of delivery.  For some women, medical treatment is required to prevent serious complications, such as seizures or stroke. This information is not intended to replace advice given to you by your health care provider. Make sure you discuss any questions you have with your health care provider. Document Released: 02/06/2014 Document Revised: 07/12/2018 Document Reviewed: 03/26/2017 Elsevier Patient Education  2020 [employer]

## 2019-05-09 NOTE — Progress Notes (Signed)
Discharge teaching complete with pt. Medications discussed with pt. Preeclampsia signs and symptoms discussed and handout given to patient.

## 2019-05-12 ENCOUNTER — Ambulatory Visit (HOSPITAL_COMMUNITY): Payer: Medicaid Other

## 2019-05-12 ENCOUNTER — Encounter (HOSPITAL_COMMUNITY): Payer: Self-pay

## 2019-05-13 ENCOUNTER — Ambulatory Visit (INDEPENDENT_AMBULATORY_CARE_PROVIDER_SITE_OTHER): Payer: Medicaid Other | Admitting: *Deleted

## 2019-05-13 ENCOUNTER — Encounter: Payer: Self-pay | Admitting: *Deleted

## 2019-05-13 ENCOUNTER — Other Ambulatory Visit: Payer: Self-pay

## 2019-05-13 VITALS — BP 154/88 | HR 93 | Ht 66.0 in | Wt >= 6400 oz

## 2019-05-13 DIAGNOSIS — O114 Pre-existing hypertension with pre-eclampsia, complicating childbirth: Secondary | ICD-10-CM

## 2019-05-13 DIAGNOSIS — O1092 Unspecified pre-existing hypertension complicating childbirth: Secondary | ICD-10-CM

## 2019-05-13 DIAGNOSIS — Z0130 Encounter for examination of blood pressure without abnormal findings: Secondary | ICD-10-CM

## 2019-05-13 MED ORDER — NIFEDIPINE ER OSMOTIC RELEASE 30 MG PO TB24: 30.0000 mg | ORAL_TABLET | Freq: Every day | ORAL | 3 refills | Status: DC

## 2019-05-13 NOTE — Progress Notes (Signed)
Pt presents for surgical dressing removal as scheduled.  She reports that her abdominal dressing fell off 2 days ago. Pt denies H/A or visual disturbances. Incision site assessed and found to be mostly healed with some areas of skin not completely approximated (wnl). No drainage, bleeding, swelling or redness observed. Pt states she was advised not to shower until the site could be examined. She has been using betadine to clean the area. There is a strong malodor observed during examination - likely due to current hygiene method. Pt is doing a good job of keeping the area dry. She was advised that she may take a daily shower and proper cleansing of the affected area was explained. BP status was discussed with Dr. Dione Plover. Rx for Nifedipine was sent to pharmacy and pt was informed. Pt has appt on 12/2 for incision and BP check. She voiced understanding of all information and instructions given.

## 2019-05-14 ENCOUNTER — Ambulatory Visit: Payer: Self-pay

## 2019-05-14 NOTE — Lactation Note (Signed)
This note was copied from a baby's chart. Lactation Consultation Note  Patient Name: Zoe Creasman JSEGB'T Date: 05/14/2019  NICU RN called this [facility] on behalf the moms medication question with breast feeding - Xarelto. Mom was put back on med about a week ago and was wondering if she still needed to pump and dump.  LC explored the " [facility] medication and mothers milk " reference 2019  and it had it classified as a LC 3/ moderately safe. LC called the [facility] pharmacist and sh compared with the pharmacy reference and recommended still pumping and dumping or take to her OB MD to have it changed to another alternative drug, for example Coumadin.  LC reported to the NICU RN that called this [facility] and she planned to share with the mother.    Maternal Data    Feeding Feeding Type: Formula  LATCH Score                   Interventions    Lactation Tools Discussed/Used     Consult Status      Jerlyn Ly Jaisean Monteforte 05/14/2019, 1:42 PM

## 2019-05-15 NOTE — Progress Notes (Signed)
Chart reviewed for nurse visit and discussed w RN in real time. Agree with plan of care, has close follow up scheduled.   Clarnce Flock, MD

## 2019-05-19 ENCOUNTER — Ambulatory Visit (HOSPITAL_COMMUNITY): Payer: Medicaid Other

## 2019-05-20 ENCOUNTER — Encounter: Payer: Medicaid Other | Admitting: Obstetrics and Gynecology

## 2019-05-20 LAB — SURGICAL PATHOLOGY

## 2019-05-21 ENCOUNTER — Other Ambulatory Visit (HOSPITAL_COMMUNITY)
Admit: 2019-05-21 | Discharge: 2019-05-21 | Disposition: A | Discharge status: Home / Self Care | Payer: Medicaid Other | Attending: Obstetrics and Gynecology | Admitting: Obstetrics and Gynecology

## 2019-05-21 ENCOUNTER — Other Ambulatory Visit: Payer: Self-pay

## 2019-05-21 ENCOUNTER — Ambulatory Visit (INDEPENDENT_AMBULATORY_CARE_PROVIDER_SITE_OTHER): Payer: Medicaid Other

## 2019-05-21 VITALS — BP 167/97 | HR 99 | Wt >= 6400 oz

## 2019-05-21 DIAGNOSIS — Z0131 Encounter for examination of blood pressure with abnormal findings: Secondary | ICD-10-CM

## 2019-05-21 DIAGNOSIS — Z5189 Encounter for other specified aftercare: Secondary | ICD-10-CM

## 2019-05-21 DIAGNOSIS — Z9889 Other specified postprocedural states: Secondary | ICD-10-CM

## 2019-05-21 NOTE — Progress Notes (Signed)
Pt here today for BP check and incision check s/p c-section on 05/06/19.  Pt denies any pain or bleeding.  Incision well approximated, healing well, continues to have some granulation tissue present across entire incision, no drainage, no odor, and no erythema.  Pt BP 150/92 in [place], rpt BP in [place] 167/97.  Pt reports having a mild headache that was relieved by Tylenol.  Notified Dr. Roselie Awkward who recommended that pt start taking two tablets of Procardia 30 mg po daily.  I advised pt to please go home and take one tablet of Procardia 30 mg then recheck her BP in one hour and send value in MyChart message.  Pt verbalized understanding.    Mel Almond., RN 05/21/19

## 2019-05-23 ENCOUNTER — Encounter (HOSPITAL_COMMUNITY): Admission: RE | Payer: Self-pay | Source: Home / Self Care

## 2019-05-23 ENCOUNTER — Inpatient Hospital Stay (HOSPITAL_COMMUNITY)
Admission: RE | Admit: 2019-05-23 | Payer: Medicaid Other | Source: Home / Self Care | Admitting: Obstetrics and Gynecology

## 2019-05-23 SURGERY — Surgical Case
Anesthesia: Regional

## 2019-06-04 ENCOUNTER — Ambulatory Visit: Payer: Medicaid Other | Admitting: Obstetrics and Gynecology

## 2019-06-04 ENCOUNTER — Encounter: Payer: Self-pay | Admitting: Family Medicine

## 2019-06-23 ENCOUNTER — Encounter (HOSPITAL_COMMUNITY): Payer: Self-pay | Admitting: Family Medicine

## 2019-06-23 ENCOUNTER — Other Ambulatory Visit: Payer: Self-pay

## 2019-06-23 ENCOUNTER — Inpatient Hospital Stay (HOSPITAL_BASED_OUTPATIENT_CLINIC_OR_DEPARTMENT_OTHER): Payer: Medicaid Other

## 2019-06-23 ENCOUNTER — Inpatient Hospital Stay (HOSPITAL_COMMUNITY)
Admission: AD | Admit: 2019-06-23 | Discharge: 2019-06-23 | Disposition: A | Discharge status: Home / Self Care | Payer: Medicaid Other | Attending: Family Medicine | Admitting: Family Medicine

## 2019-06-23 DIAGNOSIS — Z8249 Family history of ischemic heart disease and other diseases of the circulatory system: Secondary | ICD-10-CM | POA: Diagnosis not present

## 2019-06-23 DIAGNOSIS — O9089 Other complications of the puerperium, not elsewhere classified: Secondary | ICD-10-CM | POA: Diagnosis not present

## 2019-06-23 DIAGNOSIS — Z833 Family history of diabetes mellitus: Secondary | ICD-10-CM | POA: Insufficient documentation

## 2019-06-23 DIAGNOSIS — O1003 Pre-existing essential hypertension complicating the puerperium: Secondary | ICD-10-CM | POA: Diagnosis not present

## 2019-06-23 DIAGNOSIS — Z91040 Latex allergy status: Secondary | ICD-10-CM | POA: Insufficient documentation

## 2019-06-23 DIAGNOSIS — R2241 Localized swelling, mass and lump, right lower limb: Secondary | ICD-10-CM

## 2019-06-23 DIAGNOSIS — M7989 Other specified soft tissue disorders: Secondary | ICD-10-CM

## 2019-06-23 DIAGNOSIS — Z91018 Allergy to other foods: Secondary | ICD-10-CM | POA: Diagnosis not present

## 2019-06-23 DIAGNOSIS — O9081 Anemia of the puerperium: Secondary | ICD-10-CM | POA: Insufficient documentation

## 2019-06-23 DIAGNOSIS — K0889 Other specified disorders of teeth and supporting structures: Secondary | ICD-10-CM | POA: Insufficient documentation

## 2019-06-23 DIAGNOSIS — Z8759 Personal history of other complications of pregnancy, childbirth and the puerperium: Secondary | ICD-10-CM | POA: Insufficient documentation

## 2019-06-23 DIAGNOSIS — Z800 Family history of malignant neoplasm of digestive organs: Secondary | ICD-10-CM | POA: Diagnosis not present

## 2019-06-23 DIAGNOSIS — R519 Headache, unspecified: Secondary | ICD-10-CM | POA: Diagnosis not present

## 2019-06-23 DIAGNOSIS — Z809 Family history of malignant neoplasm, unspecified: Secondary | ICD-10-CM | POA: Diagnosis not present

## 2019-06-23 DIAGNOSIS — I10 Essential (primary) hypertension: Secondary | ICD-10-CM

## 2019-06-23 DIAGNOSIS — D649 Anemia, unspecified: Secondary | ICD-10-CM | POA: Diagnosis not present

## 2019-06-23 DIAGNOSIS — O1415 Severe pre-eclampsia, complicating the puerperium: Secondary | ICD-10-CM

## 2019-06-23 DIAGNOSIS — O09299 Supervision of pregnancy with other poor reproductive or obstetric history, unspecified trimester: Secondary | ICD-10-CM

## 2019-06-23 LAB — COMPREHENSIVE METABOLIC PANEL
ALT: 13 U/L (ref 0–44)
AST: 12 U/L — ABNORMAL LOW (ref 15–41)
Albumin: 3.2 g/dL — ABNORMAL LOW (ref 3.5–5.0)
Alkaline Phosphatase: 83 U/L (ref 38–126)
Anion gap: 9 (ref 5–15)
BUN: 8 mg/dL (ref 6–20)
CO2: 26 mmol/L (ref 22–32)
Calcium: 8.8 mg/dL — ABNORMAL LOW (ref 8.9–10.3)
Chloride: 103 mmol/L (ref 98–111)
Creatinine, Ser: 0.79 mg/dL (ref 0.44–1.00)
GFR calc Af Amer: >60 mL/min (ref >60)
GFR calc non Af Amer: >60 mL/min (ref >60)
Glucose, Bld: 101 mg/dL — ABNORMAL HIGH (ref 70–99)
Potassium: 3.8 mmol/L (ref 3.5–5.1)
Sodium: 138 mmol/L (ref 135–145)
Total Bilirubin: 0.4 mg/dL (ref 0.3–1.2)
Total Protein: 7.9 g/dL (ref 6.5–8.1)

## 2019-06-23 LAB — URINALYSIS, ROUTINE W REFLEX MICROSCOPIC
Bilirubin Urine: NEGATIVE
Glucose, UA: NEGATIVE mg/dL
Ketones, ur: NEGATIVE mg/dL
Leukocytes,Ua: NEGATIVE
Nitrite: NEGATIVE
Protein, ur: NEGATIVE mg/dL
Specific Gravity, Urine: 1.015 (ref 1.005–1.030)
pH: 6 (ref 5.0–8.0)

## 2019-06-23 LAB — CBC
HCT: 28.9 % — ABNORMAL LOW (ref 36.0–46.0)
Hemoglobin: 8.8 g/dL — ABNORMAL LOW (ref 12.0–15.0)
MCH: 26.2 pg (ref 26.0–34.0)
MCHC: 30.4 g/dL (ref 30.0–36.0)
MCV: 86 fL (ref 80.0–100.0)
Platelets: 302 10*3/uL (ref 150–400)
RBC: 3.36 MIL/uL — ABNORMAL LOW (ref 3.87–5.11)
RDW: 16.3 % — ABNORMAL HIGH (ref 11.5–15.5)
WBC: 5.9 10*3/uL (ref 4.0–10.5)
nRBC: 0 % (ref 0.0–0.2)

## 2019-06-23 LAB — PROTEIN / CREATININE RATIO, URINE
Creatinine, Urine: 100.6 mg/dL
Protein Creatinine Ratio: 0.1 mg/mg{Cre} (ref 0.00–0.15)
Total Protein, Urine: 10 mg/dL

## 2019-06-23 MED ORDER — HYDROCHLOROTHIAZIDE 12.5 MG PO CAPS: 25.0000 mg | ORAL_CAPSULE | Freq: Every day | ORAL | 0 refills | Status: DC

## 2019-06-23 MED ORDER — OXYCODONE-ACETAMINOPHEN 5-325 MG PO TABS: 1.0000 | ORAL_TABLET | Freq: Four times a day (QID) | ORAL | 0 refills | Status: DC | PRN

## 2019-06-23 MED ORDER — NIFEDIPINE ER OSMOTIC RELEASE 30 MG PO TB24: 60.0000 mg | ORAL_TABLET | Freq: Every day | ORAL | 0 refills | Status: DC

## 2019-06-23 MED ORDER — CEPHALEXIN 500 MG PO CAPS: 500.0000 mg | ORAL_CAPSULE | Freq: Three times a day (TID) | ORAL | 0 refills | Status: DC

## 2019-06-23 NOTE — MAU Note (Signed)
.   Krystal Hartman is   here in MAU reporting: that she has pain in her tooth and swelling in her feet. Pt delivered via C/S Nov 17th. States she just had her BP meds increased this week   Pain score: 9 Vitals:   06/23/19 1512 06/23/19 1513  BP:  (!) 171/96  Pulse: 82   Resp: 16   Temp: 98.3 F (36.8 C)    FHT: Lab orders placed from triage:

## 2019-06-23 NOTE — Discharge Instructions (Signed)
Deep Vein Thrombosis  Deep vein thrombosis (DVT) is a condition in which a blood clot forms in a deep vein, such as a lower leg, thigh, or arm vein. A clot is blood that has thickened into a gel or solid. This condition is dangerous. It can lead to serious and even life-threatening complications if the clot travels to the lungs and causes a blockage (pulmonary embolism). It can also damage veins in the leg. This can result in leg pain, swelling, discoloration, and sores (post-thrombotic syndrome). What are the causes? This condition may be caused by:  A slowdown of blood flow.  Damage to a vein.  A condition that causes blood to clot more easily, such as an inherited clotting disorder. What increases the risk? The following factors may make you more likely to develop this condition:  Being overweight.  Being older, especially over age 60.  Sitting or lying down for more than four hours.  Being in the hospital.  Lack of physical activity (sedentary lifestyle).  Pregnancy, being in childbirth, or having recently given birth.  Taking medicines that contain estrogen, such as medicines to prevent pregnancy.  Smoking.  A history of any of the following: ? Blood clots or a blood clotting disease. ? Peripheral vascular disease. ? Inflammatory bowel disease. ? Cancer. ? Heart disease. ? Genetic conditions that affect how your blood clots, such as Factor V Leiden mutation. ? Neurological diseases that affect your legs (leg paresis). ? A recent injury, such as a car accident. ? Major or lengthy surgery. ? A central line placed inside a large vein. What are the signs or symptoms? Symptoms of this condition include:  Swelling, pain, or tenderness in an arm or leg.  Warmth, redness, or discoloration in an arm or leg. If the clot is in your leg, symptoms may be more noticeable or worse when you stand or walk. Some people may not develop any symptoms. How is this diagnosed? This  condition is diagnosed with:  A medical history and physical exam.  Tests, such as: ? Blood tests. These are done to check how well your blood clots. ? Ultrasound. This is done to check for clots. ? Venogram. For this test, contrast dye is injected into a vein and X-rays are taken to check for any clots. How is this treated? Treatment for this condition depends on:  The cause of your DVT.  Your risk for bleeding or developing more clots.  Any other medical conditions that you have. Treatment may include:  Taking a blood thinner (anticoagulant). This type of medicine prevents clots from forming. It may be taken by mouth, injected under the skin, or injected through an IV (catheter).  Injecting clot-dissolving medicines into the affected vein (catheter-directed thrombolysis).  Having surgery. Surgery may be done to: ? Remove the clot. ? Place a filter in a large vein to catch blood clots before they reach the lungs. Some treatments may be continued for up to six months. Follow these instructions at home: If you are taking blood thinners:  Take the medicine exactly as told by your health care provider. Some blood thinners need to be taken at the same time every day. Do not skip a dose.  Talk with your health care provider before you take any medicines that contain aspirin or NSAIDs. These medicines increase your risk for dangerous bleeding.  Ask your health care provider about foods and drugs that could change the way the medicine works (may interact). Avoid those things if your   health care provider tells you to do so.  Blood thinners can cause easy bruising and may make it difficult to stop bleeding. Because of this: ? Be very careful when using knives, scissors, or other sharp objects. ? Use an electric razor instead of a blade. ? Avoid activities that could cause injury or bruising, and follow instructions about how to prevent falls.  Wear a medical alert bracelet or carry a  card that lists what medicines you take. General instructions  Take over-the-counter and prescription medicines only as told by your health care provider.  Return to your normal activities as told by your health care provider. Ask your health care provider what activities are safe for you.  Wear compression stockings if recommended by your health care provider.  Keep all follow-up visits as told by your health care provider. This is important. How is this prevented? To lower your risk of developing this condition again:  For 30 or more minutes every day, do an activity that: ? Involves moving your arms and legs. ? Increases your heart rate.  When traveling for longer than four hours: ? Exercise your arms and legs every hour. ? Drink plenty of water. ? Avoid drinking alcohol.  Avoid sitting or lying for a long time without moving your legs.  If you have surgery or you are hospitalized, ask about ways to prevent blood clots. These may include taking frequent walks or using anticoagulants.  Stay at a healthy weight.  If you are a woman who is older than age 49, avoid unnecessary use of medicines that contain estrogen, such as some birth control pills.  Do not use any products that contain nicotine or tobacco, such as cigarettes and e-cigarettes. This is especially important if you take estrogen medicines. If you need help quitting, ask your health care provider. Contact a health care provider if:  You miss a dose of your blood thinner.  Your menstrual period is heavier than usual.  You have unusual bruising. Get help right away if:  You have: ? New or increased pain, swelling, or redness in an arm or leg. ? Numbness or tingling in an arm or leg. ? Shortness of breath. ? Chest pain. ? A rapid or irregular heartbeat. ? A severe headache or confusion. ? A cut that will not stop bleeding.  There is blood in your vomit, stool, or urine.  You have a serious fall or accident,  or you hit your head.  You feel light-headed or dizzy.  You cough up blood. These symptoms may represent a serious problem that is an emergency. Do not wait to see if the symptoms will go away. Get medical help right away. Call your local emergency services (911 in the [place] Do not drive yourself to the hospital. Summary  Deep vein thrombosis (DVT) is a condition in which a blood clot forms in a deep vein, such as a lower leg, thigh, or arm vein.  Symptoms can include swelling, warmth, pain, and redness in your leg or arm.  This condition may be treated with a blood thinner (anticoagulant medicine), medicine that is injected to dissolve blood clots,compression stockings, or surgery.  If you are prescribed blood thinners, take them exactly as told. This information is not intended to replace advice given to you by your health care provider. Make sure you discuss any questions you have with your health care provider. Document Revised: 05/18/2017 Document Reviewed: 11/03/2016 Elsevier Patient Education  2020 [employer] Dental Pain Dental pain  may be caused by many things, including:  Tooth decay (cavities or caries). Cavities expose the nerve of your tooth to air and to hot or cold temperatures. This can cause pain or discomfort.  Abscess or infection. A dental abscess is a collection of pus from a bacterial infection in the inner part of the tooth (pulp). It usually occurs at the end of the root of a tooth.  Injury.  An unknown reason (idiopathic). Your pain may be mild or severe. It may occur when you are:  Chewing.  Exposed to hot or cold temperatures.  Eating or drinking sugary foods or beverages, such as soda or candy. Your pain may be constant, or it may come and go without cause. Follow these instructions at home:  Watch your dental pain for any changes. The following actions may help to lessen any discomfort that you are feeling: Medicines  Take over-the-counter and  prescription medicines only as told by your health care provider.  If you were prescribed an antibiotic medicine, take it as told by your health care provider. Do not stop taking the antibiotic even if you start to feel better. Eating and drinking  Avoid foods or drinks that cause you pain, such as: ? Very hot or very cold foods or drinks. ? Sweet or sugary foods or drinks. Managing pain and swelling  Apply ice to the painful area of your face: ? Put ice in a plastic bag. ? Place a towel between your skin and the bag. ? Leave the ice on for 20 minutes, 2-3 times a day. Brushing your teeth  To keep your mouth and gums healthy, use fluoride toothpaste to brush your teeth twice a day. Floss once a day.  Use a toothpaste made for sensitive teeth if directed by your health care provider.  Brush your teeth with a soft-bristled toothbrush. General instructions  Do not apply heat to the outside of your face.  Gargle with a salt-water mixture 3-4 times a day or as needed. To make a salt-water mixture, completely dissolve -1 tsp of salt in 1 cup of warm water.  Keep all follow-up visits as told by your health care provider. This is important.  Apply ice to the outside of your jaw if there is swelling. Do not put ice directly on the skin. Contact a health care provider if:  Your pain is not controlled with medicines.  Your symptoms get worse.  You have new symptoms. Get help right away if you:  Are unable to open your mouth.  Are having trouble breathing or swallowing.  Have a fever.  Notice that your face, neck, or jaw is swollen. Summary  Dental pain may be caused by many things, including tooth decay and infection.  Your pain may be mild or severe.  Take over-the-counter and prescription medicines only as told by your health care provider.  Watch your dental pain for any changes. Let your health care provider know if symptoms get worse. This information is not intended  to replace advice given to you by your health care provider. Make sure you discuss any questions you have with your health care provider. Document Revised: 10/01/2018 Document Reviewed: 04/26/2017 Elsevier Patient Education  2020 [employer] Postpartum Hypertension Postpartum hypertension is high blood pressure that remains higher than normal after childbirth. You may not realize that you have postpartum hypertension if your blood pressure is not being checked regularly. In most cases, postpartum hypertension will go away on its own, usually within a  week of delivery. However, for some women, medical treatment is required to prevent serious complications, such as seizures or stroke. What are the causes? This condition may be caused by one or more of the following:  Hypertension that existed before pregnancy (chronic hypertension).  Hypertension that comes on as a result of pregnancy (gestational hypertension).  Hypertensive disorders during pregnancy (preeclampsia) or seizures in women who have high blood pressure during pregnancy (eclampsia).  A condition in which the liver, platelets, and red blood cells are damaged during pregnancy (HELLP syndrome).  A condition in which the thyroid produces too much hormones (hyperthyroidism).  Other rare problems of the nerves (neurological disorders) or blood disorders. In some cases, the cause may not be known. What increases the risk? The following factors may make you more likely to develop this condition:  Chronic hypertension. In some cases, this may not have been diagnosed before pregnancy.  Obesity.  Type 2 diabetes.  Kidney disease.  History of preeclampsia or eclampsia.  Other medical conditions that change the level of hormones in the body (hormonal imbalance). What are the signs or symptoms? As with all types of hypertension, postpartum hypertension may not have any symptoms. Depending on how high your blood pressure is, you may  experience:  Headaches. These may be mild, moderate, or severe. They may also be steady, constant, or sudden in onset (thunderclap headache).  Changes in your ability to see (visual changes).  Dizziness.  Shortness of breath.  Swelling of your hands, feet, lower legs, or face. In some cases, you may have swelling in more than one of these locations.  Heart palpitations or a racing heartbeat.  Difficulty breathing while lying down.  Decrease in the amount of urine that you pass. Other rare signs and symptoms may include:  Sweating more than usual. This lasts longer than a few days after delivery.  Chest pain.  Sudden dizziness when you get up from sitting or lying down.  Seizures.  Nausea or vomiting.  Abdominal pain. How is this diagnosed? This condition may be diagnosed based on the results of a physical exam, blood pressure measurements, and blood and urine tests. You may also have other tests, such as a CT scan or an MRI, to check for other problems of postpartum hypertension. How is this treated? If blood pressure is high enough to require treatment, your options may include:  Medicines to reduce blood pressure (antihypertensives). Tell your health care provider if you are breastfeeding or if you plan to breastfeed. There are many antihypertensive medicines that are safe to take while breastfeeding.  Stopping medicines that may be causing hypertension.  Treating medical conditions that are causing hypertension.  Treating the complications of hypertension, such as seizures, stroke, or kidney problems. Your health care provider will also continue to monitor your blood pressure closely until it is within a safe range for you. Follow these instructions at home:  Take over-the-counter and prescription medicines only as told by your health care provider.  Return to your normal activities as told by your health care provider. Ask your health care provider what activities  are safe for you.  Do not use any products that contain nicotine or tobacco, such as cigarettes and e-cigarettes. If you need help quitting, ask your health care provider.  Keep all follow-up visits as told by your health care provider. This is important. Contact a health care provider if:  Your symptoms get worse.  You have new symptoms, such as: ? A headache that  does not get better. ? Dizziness. ? Visual changes. Get help right away if:  You suddenly develop swelling in your hands, ankles, or face.  You have sudden, rapid weight gain.  You develop difficulty breathing, chest pain, racing heartbeat, or heart palpitations.  You develop severe pain in your abdomen.  You have any symptoms of a stroke. "BE FAST" is an easy way to remember the main warning signs of a stroke: ? B - Balance. Signs are dizziness, sudden trouble walking, or loss of balance. ? E - Eyes. Signs are trouble seeing or a sudden change in vision. ? F - Face. Signs are sudden weakness or numbness of the face, or the face or eyelid drooping on one side. ? A - Arms. Signs are weakness or numbness in an arm. This happens suddenly and usually on one side of the body. ? S - Speech. Signs are sudden trouble speaking, slurred speech, or trouble understanding what people say. ? T - Time. Time to call emergency services. Write down what time symptoms started.  You have other signs of a stroke, such as: ? A sudden, severe headache with no known cause. ? Nausea or vomiting. ? Seizure. These symptoms may represent a serious problem that is an emergency. Do not wait to see if the symptoms will go away. Get medical help right away. Call your local emergency services (911 in the [place] Do not drive yourself to the hospital. Summary  Postpartum hypertension is high blood pressure that remains higher than normal after childbirth.  In most cases, postpartum hypertension will go away on its own, usually within a week of  delivery.  For some women, medical treatment is required to prevent serious complications, such as seizures or stroke. This information is not intended to replace advice given to you by your health care provider. Make sure you discuss any questions you have with your health care provider. Document Revised: 07/12/2018 Document Reviewed: 03/26/2017 Elsevier Patient Education  2020 [employer] Preeclampsia and Eclampsia Preeclampsia is a serious condition that may develop during pregnancy. This condition causes high blood pressure and increased protein in your urine along with other symptoms, such as headaches and vision changes. These symptoms may develop as the condition gets worse. Preeclampsia may occur at 20 weeks of pregnancy or later. Diagnosing and treating preeclampsia early is very important. If not treated early, it can cause serious problems for you and your baby. One problem it can lead to is eclampsia. Eclampsia is a condition that causes muscle jerking or shaking (convulsions or seizures) and other serious problems for the mother. During pregnancy, delivering your baby may be the best treatment for preeclampsia or eclampsia. For most women, preeclampsia and eclampsia symptoms go away after giving birth. In rare cases, a woman may develop preeclampsia after giving birth (postpartum preeclampsia). This usually occurs within 48 hours after childbirth but may occur up to 6 weeks after giving birth. What are the causes? The cause of preeclampsia is not known. What increases the risk? The following risk factors make you more likely to develop preeclampsia:  Being pregnant for the first time.  Having had preeclampsia during a past pregnancy.  Having a family history of preeclampsia.  Having high blood pressure.  Being pregnant with more than one baby.  Being 70 or older.  Being African-American.  Having kidney disease or diabetes.  Having medical conditions such as lupus or  blood diseases.  Being very overweight (obese). What are the signs or symptoms? The  most common symptoms are:  Severe headaches.  Vision problems, such as blurred or double vision.  Abdominal pain, especially upper abdominal pain. Other symptoms that may develop as the condition gets worse include:  Sudden weight gain.  Sudden swelling of the hands, face, legs, and feet.  Severe nausea and vomiting.  Numbness in the face, arms, legs, and feet.  Dizziness.  Urinating less than usual.  Slurred speech.  Convulsions or seizures. How is this diagnosed? There are no screening tests for preeclampsia. Your health care provider will ask you about symptoms and check for signs of preeclampsia during your prenatal visits. You may also have tests that include:  Checking your blood pressure.  Urine tests to check for protein. Your health care provider will check for this at every prenatal visit.  Blood tests.  Monitoring your baby's heart rate.  Ultrasound. How is this treated? You and your health care provider will determine the treatment approach that is best for you. Treatment may include:  Having more frequent prenatal exams to check for signs of preeclampsia, if you have an increased risk for preeclampsia.  Medicine to lower your blood pressure.  Staying in the hospital, if your condition is severe. There, treatment will focus on controlling your blood pressure and the amount of fluids in your body (fluid retention).  Taking medicine (magnesium sulfate) to prevent seizures. This may be given as an injection or through an IV.  Taking a low-dose aspirin during your pregnancy.  Delivering your baby early. You may have your labor started with medicine (induced), or you may have a cesarean delivery. Follow these instructions at home: Eating and drinking   Drink enough fluid to keep your urine pale yellow.  Avoid caffeine. Lifestyle  Do not use any products that  contain nicotine or tobacco, such as cigarettes and e-cigarettes. If you need help quitting, ask your health care provider.  Do not use alcohol or drugs.  Avoid stress as much as possible. Rest and get plenty of sleep. General instructions  Take over-the-counter and prescription medicines only as told by your health care provider.  When lying down, lie on your left side. This keeps pressure off your major blood vessels.  When sitting or lying down, raise (elevate) your feet. Try putting some pillows underneath your lower legs.  Exercise regularly. Ask your health care provider what kinds of exercise are best for you.  Keep all follow-up and prenatal visits as told by your health care provider. This is important. How is this prevented? There is no known way of preventing preeclampsia or eclampsia from developing. However, to lower your risk of complications and detect problems early:  Get regular prenatal care. Your health care provider may be able to diagnose and treat the condition early.  Maintain a healthy weight. Ask your health care provider for help managing weight gain during pregnancy.  Work with your health care provider to manage any long-term (chronic) health conditions you have, such as diabetes or kidney problems.  You may have tests of your blood pressure and kidney function after giving birth.  Your health care provider may have you take low-dose aspirin during your next pregnancy. Contact a health care provider if:  You have symptoms that your health care provider told you may require more treatment or monitoring, such as: ? Headaches. ? Nausea or vomiting. ? Abdominal pain. ? Dizziness. ? Light-headedness. Get help right away if:  You have severe: ? Abdominal pain. ? Headaches that do not get  better. ? Dizziness. ? Vision problems. ? Confusion. ? Nausea or vomiting.  You have any of the following: ? A seizure. ? Sudden, rapid weight gain. ? Sudden  swelling in your hands, ankles, or face. ? Trouble moving any part of your body. ? Numbness in any part of your body. ? Trouble speaking. ? Abnormal bleeding.  You faint. Summary  Preeclampsia is a serious condition that may develop during pregnancy.  This condition causes high blood pressure and increased protein in your urine along with other symptoms, such as headaches and vision changes.  Diagnosing and treating preeclampsia early is very important. If not treated early, it can cause serious problems for you and your baby.  Get help right away if you have symptoms that your health care provider told you to watch for. This information is not intended to replace advice given to you by your health care provider. Make sure you discuss any questions you have with your health care provider. Document Revised: 02/05/2018 Document Reviewed: 01/10/2016 Elsevier Patient Education  2020 [employer]

## 2019-06-23 NOTE — Progress Notes (Signed)
Lower extremity venous has been completed.   Preliminary results in CV Proc.   Blanch Media 06/23/2019 6:17 PM

## 2019-06-23 NOTE — MAU Provider Note (Signed)
History     CSN: [id number]  Arrival date and time: 06/23/19 1447   First Provider Initiated Contact with Patient 06/23/19 1521      Chief Complaint  Patient presents with  . Dental Pain  . Foot Swelling   Krystal Hartman is a 29 y.o. [phone] s/p CS for twin gestation d/t severe preeclampsia superimposed on cHTN who presents to MAU for preeclampsia evaluation. Pt reports she had magnesium during delivery. Pt is almost 7 weeks ppartum.  Pt reports she has had chronic HTN for about 5 years. Pt reports normal BP at home lately is 140-170/73-80. Pt reports taking 25m of Procardia, once a day in the AM, and patient reports she took her 669mtoday. This dosage was advised by her OB/GYN on 05/21/2019. Pt reports she does miss occasional doses, but that this is few and far between. Pt confirms her home blood pressure cuff has been checked for fit by her clinic.  Pt reports she has a HA today that she rates as 8/10, but reports this is secondary to a toothache that started about 06/16/2019 that has worsened. Pt reports the HA started the past two days. Pt reports she is taking Tylenol and ibuprofen PRN. Pt reports she last took ibuprofen [phone] typical dose) around 11AM - pt reports this did not touch her tooth pain or headache. Pt reports she last took Tylenol yesterday. Pt reports swelling, but denies drainage from tooth. Pt reports this tooth is broken and has been "for a while" but the pain just started recently.  Pt also reports swelling in her right foot that has been going on since before Christmas. Pt reports she has tried elevation and using Epsom salts, but this has not helped. Pt denies any recent trauma to foot.  Pt denies blurry vision/seeing spots, N/V, epigastric pain, swelling in face and hands, sudden weight gain. Pt denies chest pain and SOB.  Pt denies constipation, diarrhea, or urinary problems. Pt denies fever, chills, fatigue, sweating or changes in appetite.  Pt denies dizziness, light-headedness, weakness.  Allergies? Latex, pork, NKDA Current medications? Procardia 58m49mily, Tylenol PRN, ibuprofen PRN Current PNC & next appt? Pt reports she had a ppartum visit scheduled for 06/04/2019, but missed it d/t transportation issues, so it is rescheduled to 07/16/2018, which was the first available.   OB History as of 05/05/2019    Gravida  5   Para  3   Term  3   Preterm  0   AB  1   Living  3     SAB  1   TAB  0   Ectopic  0   Multiple  0   Live Births  3           Past Medical History:  Diagnosis Date  . Anemia   . Boil    on side  . Cesarean section wound complication 1/6/0/1/7494onsider wound vac   . Hypertension    started on BP med yesterday  . 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 None present at PAT appt.  . POMarland KitchenV (postoperative nausea and vomiting) 2013   pt. states she had a lot of nausea and vomiting after her last c[facility]s  [facility]   . Previous cesarean delivery, antepartum condition or complication 3/3/4/9/6759or tertiary C/S, no tubal     Past  Surgical History:  Procedure Laterality Date  . CESAREAN SECTION    . CESAREAN SECTION  06/22/2011   Procedure: CESAREAN SECTION;  Surgeon: Eldred Manges, MD;  Location: [facility];  Service: Gynecology;  Laterality: N/A;  MRSA positive, Latex Allergy  . CESAREAN SECTION N/A 12/11/2012   Procedure: REPEAT CESAREAN SECTION;  Surgeon: Woodroe Mode, MD;  Location: [facility];  Service: Obstetrics;  Laterality: N/A;  . CESAREAN SECTION MULTI-GESTATIONAL N/A 05/06/2019   Procedure: CESAREAN SECTION MULTI-GESTATIONAL;  Surgeon: Osborne Oman, MD;  Location: [facility];  Service: Obstetrics;  Laterality: N/A;  . EYE SURGERY    . IUD REMOVAL N/A 08/29/2017   Procedure: HYSTEROSCOPY WITH INTRAUTERINE DEVICE (IUD)  REMOVAL;  Surgeon: Osborne Oman, MD;  Location: [facility];  Service: Gynecology;  Laterality: N/A;    Family History  Problem Relation Age of Onset  . Diabetes Mother   . Diabetes Maternal Aunt   . Diabetes Father   . Heart disease Father   . Colon cancer Father   . Cancer Maternal Grandmother     Social History   Tobacco Use  . Smoking status: Never Smoker  . Smokeless tobacco: Never Used  Substance Use Topics  . Alcohol use: No  . Drug use: No    Allergies:  Allergies  Allergen Reactions  . Latex Hives and Itching  . Pork-Derived Products Other (See Comments)    Does not eat due to religious reasons.    Medications Prior to Admission  Medication Sig Dispense Refill Last Dose  . acetaminophen (TYLENOL) 325 MG tablet Take 650 mg by mouth every 6 (six) hours as needed for mild pain or headache.    06/22/2019 at Unknown time  . ferrous sulfate 325 (65 FE) MG tablet Take 1 tablet (325 mg total) by mouth 2 (two) times daily with a meal. 60 tablet 3 06/23/2019 at Unknown time  . ibuprofen (ADVIL) 200 MG tablet Take 800 mg by mouth every 6 (six) hours as needed for moderate pain.   06/23/2019 at Unknown time  . [DISCONTINUED] NIFEdipine (PROCARDIA-XL/NIFEDICAL-XL) 30 MG 24 hr tablet Take 1 tablet (30 mg total) by mouth daily. (Patient taking differently: Take 30 mg by mouth daily. Pt states that she now takes 2 tablets 1 time a day) 30 tablet 3 06/23/2019 at Unknown time  . AMBULATORY NON FORMULARY MEDICATION 1 Device by Other route once a week. Medication Name: Blood Pressure Cuff, extra large Monitored regularly at home ICD 10  O09.90,  O09.299 1 kit 0     Review of Systems  Constitutional: Negative for chills, diaphoresis, fatigue and fever.  HENT: Positive for dental problem.   Eyes: Negative for visual disturbance.  Respiratory: Negative for shortness of breath.   Cardiovascular: Negative for chest pain.  Gastrointestinal: Negative for abdominal pain, constipation, diarrhea,  nausea and vomiting.  Genitourinary: Negative for dysuria, flank pain, frequency, pelvic pain, urgency, vaginal bleeding and vaginal discharge.  Musculoskeletal:       Swelling of right lower extremity below knee.  Neurological: Positive for headaches. Negative for dizziness, weakness and light-headedness.   Physical Exam   Blood pressure (!) 144/81, pulse 73, temperature 98.3 F (36.8 C), resp. rate 16, weight (!) 193.2 kg, last menstrual period 09/03/2018, unknown if currently breastfeeding.  Patient Vitals for the past 24 hrs:  BP Temp Pulse Resp Weight  06/23/19 1747 (!) 144/81 - 73 - -  06/23/19 1732 (!) 143/86 - 76 - -  06/23/19 1717 Marland Kitchen)  147/79 - 76 - -  06/23/19 1710 139/71 - 81 - -  06/23/19 1632 (!) 156/87 - 82 - -  06/23/19 1617 (!) 144/66 - 89 - -  06/23/19 1606 (!) 169/95 - 80 - -  06/23/19 1535 (!) 171/97 - - - -  06/23/19 1513 (!) 171/96 - - - -  06/23/19 1512 - 98.3 F (36.8 C) 82 16 -  06/23/19 1511 - - - - (!) 193.2 kg   Physical Exam  Constitutional: She is oriented to person, place, and time. She appears well-developed and well-nourished. No distress.  HENT:  Head: Normocephalic and atraumatic.  Mouth/Throat:    Respiratory: Effort normal.  GI: Soft.  Musculoskeletal:     Comments: Noticeable edema on right leg compared to left, especially pedal edema with redness over top of foot. No calf tenderness, redness, no pain on palpation.  Neurological: She is alert and oriented to person, place, and time.  Skin: She is not diaphoretic.  Psychiatric: She has a normal mood and affect. Her behavior is normal. Judgment and thought content normal.    Results for orders placed or performed during the hospital encounter of 06/23/19 (from the past 24 hour(s))  CBC     Status: Abnormal   Collection Time: 06/23/19  3:53 PM  Result Value Ref Range   WBC 5.9 4.0 - 10.5 K/uL   RBC 3.36 (L) 3.87 - 5.11 MIL/uL   Hemoglobin 8.8 (L) 12.0 - 15.0 g/dL   HCT 28.9 (L) 36.0 -  46.0 %   MCV 86.0 80.0 - 100.0 fL   MCH 26.2 26.0 - 34.0 pg   MCHC 30.4 30.0 - 36.0 g/dL   RDW 16.3 (H) 11.5 - 15.5 %   Platelets 302 150 - 400 K/uL   nRBC 0.0 0.0 - 0.2 %  Comprehensive metabolic panel     Status: Abnormal   Collection Time: 06/23/19  3:53 PM  Result Value Ref Range   Sodium 138 135 - 145 mmol/L   Potassium 3.8 3.5 - 5.1 mmol/L   Chloride 103 98 - 111 mmol/L   CO2 26 22 - 32 mmol/L   Glucose, Bld 101 (H) 70 - 99 mg/dL   BUN 8 6 - 20 mg/dL   Creatinine, Ser 0.79 0.44 - 1.00 mg/dL   Calcium 8.8 (L) 8.9 - 10.3 mg/dL   Total Protein 7.9 6.5 - 8.1 g/dL   Albumin 3.2 (L) 3.5 - 5.0 g/dL   AST 12 (L) 15 - 41 U/L   ALT 13 0 - 44 U/L   Alkaline Phosphatase 83 38 - 126 U/L   Total Bilirubin 0.4 0.3 - 1.2 mg/dL   GFR calc non Af Amer >60 >60 mL/min   GFR calc Af Amer >60 >60 mL/min   Anion gap 9 5 - 15  Urinalysis, Routine w reflex microscopic     Status: Abnormal   Collection Time: 06/23/19  5:02 PM  Result Value Ref Range   Color, Urine YELLOW YELLOW   APPearance CLEAR CLEAR   Specific Gravity, Urine 1.015 1.005 - 1.030   pH 6.0 5.0 - 8.0   Glucose, UA NEGATIVE NEGATIVE mg/dL   Hgb urine dipstick LARGE (A) NEGATIVE   Bilirubin Urine NEGATIVE NEGATIVE   Ketones, ur NEGATIVE NEGATIVE mg/dL   Protein, ur NEGATIVE NEGATIVE mg/dL   Nitrite NEGATIVE NEGATIVE   Leukocytes,Ua NEGATIVE NEGATIVE   RBC / HPF 0-5 0 - 5 RBC/hpf   WBC, UA 0-5 0 - 5 WBC/hpf  Bacteria, UA RARE (A) NONE SEEN   Squamous Epithelial / LPF 0-5 0 - 5   Mucus PRESENT   Protein / creatinine ratio, urine     Status: None   Collection Time: 06/23/19  5:02 PM  Result Value Ref Range   Creatinine, Urine 100.60 mg/dL   Total Protein, Urine 10 mg/dL   Protein Creatinine Ratio 0.10 0.00 - 0.15 mg/mg[Cre]   VAS [place] LOWER EXTREMITY VENOUS (DVT)  Result Date: 06/23/2019  Lower Venous Study Indications: Swelling.  Limitations: Poor ultrasound/tissue interface and body habitus. Comparison Study: no prior  Performing Technologist: Abram Sander RVS  Examination Guidelines: A complete evaluation includes B-mode imaging, spectral Doppler, color Doppler, and power Doppler as needed of all accessible portions of each vessel. Bilateral testing is considered an integral part of a complete examination. Limited examinations for reoccurring indications may be performed as noted.  +---------+---------------+---------+-----------+----------+-------------------+ RIGHT    CompressibilityPhasicitySpontaneityPropertiesThrombus Aging      +---------+---------------+---------+-----------+----------+-------------------+ CFV                                                   not visualized due                                                        to pannus           +---------+---------------+---------+-----------+----------+-------------------+ SFJ                                                   not visualized due                                                        to pannus           +---------+---------------+---------+-----------+----------+-------------------+ FV Prox                                               not visualized due                                                        to pannus           +---------+---------------+---------+-----------+----------+-------------------+ FV Mid                  Yes      Yes                                      +---------+---------------+---------+-----------+----------+-------------------+ FV Distal  Yes      Yes                                      +---------+---------------+---------+-----------+----------+-------------------+ PFV                                                   not visualized due                                                        to pannus           +---------+---------------+---------+-----------+----------+-------------------+ POP      Full           Yes       Yes                                      +---------+---------------+---------+-----------+----------+-------------------+ PTV      Full                                                             +---------+---------------+---------+-----------+----------+-------------------+ PERO                                                  Not visualized      +---------+---------------+---------+-----------+----------+-------------------+   +----+---------------+---------+-----------+----------+--------------+ LEFTCompressibilityPhasicitySpontaneityPropertiesThrombus Aging +----+---------------+---------+-----------+----------+--------------+ CFV                                              Not visualized +----+---------------+---------+-----------+----------+--------------+     Summary: Right: There is no evidence of deep vein thrombosis in the lower extremity. However, portions of this examination were limited- see technologist comments above. No cystic structure found in the popliteal fossa.  *See table(s) above for measurements and observations.    Preliminary    MAU Course  Procedures  MDM -preeclampsia evaluation with toothpain and related HA and swelling of right foot/calf -pt given magnesium 05/05/2019 during delivery -initial BP elevated in severe range, once larger cuff found and utilized, BP no longer severe range -UA: lg hgb/rare bacteria, sending urine for culture -CBC: H/H 8.6/28.9 - pt already prescribed oral iron, platelets 302 -CMP: WNL, AST/ALT 12/13, Serum Creat 0.79 -PCr: 0.10  -right calf swollen, majority of swelling in right foot -no calf tenderness/pain on palpation -DVT US (preliminary): no evidence of DVT, but portions of exam were limited  -toothpain -dental referral on discharge  -consulted with Dr. Rip Harbour. After non-severe range BP with new cuff, no preeclampsia protocol is indicated at this time. However, should a protocol be needed, oral  Procardia or oral hydralazine is OK  if IV access is unable to be established. For tooth pain, prescribe Keflex 551m TID x7days along with Percocet and a dental referral. Reported to Dr. ERip Harbourthat patient is no longer breastfeeding and has about 1/2 a bottle of Procardia remaining. Pt to continue on Procardia 618mXL daily, will refill RX. RX HCTZ 2558mor 14days to remove fluid. Schedule BP check in one week.  -pt discharged to home in stable condition  Orders Placed This Encounter  Procedures  . Culture, OB Urine    Standing Status:   Standing    Number of Occurrences:   1  . Urinalysis, Routine w reflex microscopic    Standing Status:   Standing    Number of Occurrences:   1  . CBC    Standing Status:   Standing    Number of Occurrences:   1  . Comprehensive metabolic panel    Standing Status:   Standing    Number of Occurrences:   1  . Protein / creatinine ratio, urine    Please straight cath if experiencing vaginal bleeding.    Standing Status:   Standing    Number of Occurrences:   1  . Ambulatory referral to Dentistry    Referral Priority:   Routine    Referral Type:   Consultation    Referral Reason:   Specialty Services Required    Requested Specialty:   Dental General Practice    Number of Visits Requested:   1  . [facility]    Referral Priority:   Routine    Referral Type:   Consultation    Referral Reason:   Specialty Services Required    Requested Specialty:   Family Medicine    Number of Visits Requested:   1   Meds ordered this encounter  Medications  . NIFEdipine (PROCARDIA-XL/NIFEDICAL-XL) 30 MG 24 hr tablet    Sig: Take 2 tablets (60 mg total) by mouth daily. Pt states that she now takes 2 tablets 1 time a day    Dispense:  120 tablet    Refill:  0    Order Specific Question:   Supervising Provider    Answer:   PRADonnamae Jude7[[id number]] cephALEXin (KEFLEX) 500 MG capsule    Sig: Take 1 capsule (500 mg total) by mouth 3 (three)  times daily for 7 days.    Dispense:  21 capsule    Refill:  0    Order Specific Question:   Supervising Provider    Answer:   PRADonnamae Jude7[[id number]] hydrochlorothiazide (MICROZIDE) 12.5 MG capsule    Sig: Take 2 capsules (25 mg total) by mouth daily for 14 days.    Dispense:  28 capsule    Refill:  0    Order Specific Question:   Supervising Provider    Answer:   PRADonnamae Jude7[[id number]] oxyCODONE-acetaminophen (PERCOCET) 5-325 MG tablet    Sig: Take 1-2 tablets by mouth every 6 (six) hours as needed for up to 8 doses for severe pain.    Dispense:  8 tablet    Refill:  0    Order Specific Question:   Supervising Provider    Answer:   PRADonnamae Jude7[[id number]]Assessment and Plan   1. Chronic hypertension   2. Hx of preeclampsia, prior pregnancy, currently pregnant   3. Postpartum state   4. Pain, dental   5. Nonintractable headache, unspecified chronicity pattern,  unspecified headache type   6. Localized swelling of right lower leg   7. Anemia, unspecified type    Allergies as of 06/23/2019      Reactions   Latex Hives, Itching   Pork-derived Products Other (See Comments)   Does not eat due to religious reasons.      Medication List    TAKE these medications   acetaminophen 325 MG tablet Commonly known as: TYLENOL Take 650 mg by mouth every 6 (six) hours as needed for mild pain or headache.   AMBULATORY NON FORMULARY MEDICATION 1 Device by Other route once a week. Medication Name: Blood Pressure Cuff, extra large Monitored regularly at home ICD 10  O09.90,  O09.299   cephALEXin 500 MG capsule Commonly known as: KEFLEX Take 1 capsule (500 mg total) by mouth 3 (three) times daily for 7 days.   ferrous sulfate 325 (65 FE) MG tablet Take 1 tablet (325 mg total) by mouth 2 (two) times daily with a meal.   hydrochlorothiazide 12.5 MG capsule Commonly known as: Microzide Take 2 capsules (25 mg total) by mouth daily for 14 days.   ibuprofen 200 MG tablet Commonly  known as: ADVIL Take 800 mg by mouth every 6 (six) hours as needed for moderate pain.   NIFEdipine 30 MG 24 hr tablet Commonly known as: PROCARDIA-XL/NIFEDICAL-XL Take 2 tablets (60 mg total) by mouth daily. Pt states that she now takes 2 tablets 1 time a day What changed:   how much to take  additional instructions   oxyCODONE-acetaminophen 5-325 MG tablet Commonly known as: Percocet Take 1-2 tablets by mouth every 6 (six) hours as needed for up to 8 doses for severe pain.      -will call with culture results, if positive -RX HCTZ 21m x14days -RX Procardia 687mXL daily -RX Keflex, RX percocet for tooth pain -dental referral given -referral to primary care for cHTN/leg swelling -pt has existing RX for iron, strongly encouraged to take -message sent to [facility] to schedule BP check in one week -strict DVT/preeclampsia/return MAU precautions given, pt advised to present to ED initially moving forward -pt discharged to home in stable condition  NiElmyra Ricks Hartman 06/23/2019, 8:27 PM

## 2019-06-24 LAB — CULTURE, OB URINE: Culture: NO GROWTH

## 2019-06-30 ENCOUNTER — Ambulatory Visit (INDEPENDENT_AMBULATORY_CARE_PROVIDER_SITE_OTHER): Payer: Medicaid Other | Admitting: Family Medicine

## 2019-06-30 ENCOUNTER — Other Ambulatory Visit: Payer: Self-pay

## 2019-06-30 VITALS — BP 150/95 | HR 83 | Temp 98.2°F

## 2019-06-30 DIAGNOSIS — I10 Essential (primary) hypertension: Secondary | ICD-10-CM

## 2019-06-30 MED ORDER — ETONOGESTREL-ETHINYL ESTRADIOL 0.12-0.015 MG/24HR VA RING: VAGINAL_RING | VAGINAL | 3 refills | Status: DC

## 2019-06-30 MED ORDER — HYDROCHLOROTHIAZIDE 25 MG PO TABS: 25.0000 mg | ORAL_TABLET | Freq: Every day | ORAL | 0 refills | Status: DC

## 2019-06-30 MED ORDER — LISINOPRIL 40 MG PO TABS: 40.0000 mg | ORAL_TABLET | Freq: Every day | ORAL | 0 refills | Status: DC

## 2019-06-30 NOTE — Progress Notes (Signed)
Pt was seen MAU 06/23/19 for headache and R leg swelling and pain. States had u/s of leg to r/o DVT. Swelling goes up and down in leg but states is having some redness going up her leg. Pt is here for B/P ck. Pt had repeat c/s 11/17 with twin gestation. Headaches have subsided. Bottle feeding babies. Pt is mostly concerned with redness of right leg. Not really painful but a lot of pressure with walking. Discussed with Judeth Horn NP who will see pt.

## 2019-06-30 NOTE — Progress Notes (Signed)
Mild swelling. No erythema. Had Duplex - negative for DVT. Likely beginnings of venous stasis changes. Change BP meds to lisinopril and continue HCTZ. Referral to family med.

## 2019-07-17 ENCOUNTER — Ambulatory Visit: Payer: Medicaid Other | Admitting: Obstetrics and Gynecology

## 2019-07-23 ENCOUNTER — Encounter: Payer: Self-pay | Admitting: *Deleted

## 2019-08-21 DIAGNOSIS — H538 Other visual disturbances: Secondary | ICD-10-CM | POA: Diagnosis not present

## 2019-08-21 DIAGNOSIS — I872 Venous insufficiency (chronic) (peripheral): Secondary | ICD-10-CM | POA: Diagnosis not present

## 2019-08-21 DIAGNOSIS — R87610 Atypical squamous cells of undetermined significance on cytologic smear of cervix (ASC-US): Secondary | ICD-10-CM | POA: Insufficient documentation

## 2019-08-21 DIAGNOSIS — I10 Essential (primary) hypertension: Secondary | ICD-10-CM | POA: Diagnosis not present

## 2019-08-21 DIAGNOSIS — R0683 Snoring: Secondary | ICD-10-CM | POA: Diagnosis not present

## 2019-08-21 DIAGNOSIS — M7989 Other specified soft tissue disorders: Secondary | ICD-10-CM | POA: Diagnosis not present

## 2019-09-05 DIAGNOSIS — M7989 Other specified soft tissue disorders: Secondary | ICD-10-CM | POA: Diagnosis not present

## 2019-09-05 DIAGNOSIS — I10 Essential (primary) hypertension: Secondary | ICD-10-CM | POA: Diagnosis not present

## 2019-09-05 DIAGNOSIS — R011 Cardiac murmur, unspecified: Secondary | ICD-10-CM | POA: Diagnosis not present

## 2019-09-15 DIAGNOSIS — K083 Retained dental root: Secondary | ICD-10-CM | POA: Diagnosis not present

## 2019-09-15 DIAGNOSIS — K0253 Dental caries on pit and fissure surface penetrating into pulp: Secondary | ICD-10-CM | POA: Diagnosis not present

## 2019-09-22 DIAGNOSIS — R011 Cardiac murmur, unspecified: Secondary | ICD-10-CM | POA: Diagnosis not present

## 2019-09-22 DIAGNOSIS — Z6841 Body Mass Index (BMI) 40.0 and over, adult: Secondary | ICD-10-CM | POA: Diagnosis not present

## 2019-09-22 DIAGNOSIS — I10 Essential (primary) hypertension: Secondary | ICD-10-CM | POA: Diagnosis not present

## 2019-09-24 DIAGNOSIS — K029 Dental caries, unspecified: Secondary | ICD-10-CM | POA: Diagnosis not present

## 2019-09-26 DIAGNOSIS — Z23 Encounter for immunization: Secondary | ICD-10-CM | POA: Diagnosis not present

## 2019-09-30 DIAGNOSIS — I10 Essential (primary) hypertension: Secondary | ICD-10-CM | POA: Diagnosis not present

## 2019-09-30 DIAGNOSIS — G4719 Other hypersomnia: Secondary | ICD-10-CM | POA: Diagnosis not present

## 2019-09-30 DIAGNOSIS — R0681 Apnea, not elsewhere classified: Secondary | ICD-10-CM | POA: Diagnosis not present

## 2019-09-30 DIAGNOSIS — R0683 Snoring: Secondary | ICD-10-CM | POA: Diagnosis not present

## 2019-10-02 DIAGNOSIS — H50131 Monocular exotropia with V pattern, right eye: Secondary | ICD-10-CM | POA: Diagnosis not present

## 2019-10-02 DIAGNOSIS — H52223 Regular astigmatism, bilateral: Secondary | ICD-10-CM | POA: Diagnosis not present

## 2019-10-02 DIAGNOSIS — H5213 Myopia, bilateral: Secondary | ICD-10-CM | POA: Diagnosis not present

## 2019-10-03 DIAGNOSIS — I10 Essential (primary) hypertension: Secondary | ICD-10-CM | POA: Diagnosis not present

## 2019-10-03 DIAGNOSIS — Z13220 Encounter for screening for lipoid disorders: Secondary | ICD-10-CM | POA: Diagnosis not present

## 2019-10-03 DIAGNOSIS — R7303 Prediabetes: Secondary | ICD-10-CM | POA: Insufficient documentation

## 2019-10-03 DIAGNOSIS — D649 Anemia, unspecified: Secondary | ICD-10-CM | POA: Diagnosis not present

## 2019-10-03 DIAGNOSIS — Z6841 Body Mass Index (BMI) 40.0 and over, adult: Secondary | ICD-10-CM | POA: Diagnosis not present

## 2019-10-03 DIAGNOSIS — M79671 Pain in right foot: Secondary | ICD-10-CM | POA: Diagnosis not present

## 2019-10-03 DIAGNOSIS — Z131 Encounter for screening for diabetes mellitus: Secondary | ICD-10-CM | POA: Diagnosis not present

## 2019-10-17 DIAGNOSIS — R87610 Atypical squamous cells of undetermined significance on cytologic smear of cervix (ASC-US): Secondary | ICD-10-CM | POA: Diagnosis not present

## 2019-10-17 DIAGNOSIS — I10 Essential (primary) hypertension: Secondary | ICD-10-CM | POA: Diagnosis not present

## 2019-10-17 DIAGNOSIS — E559 Vitamin D deficiency, unspecified: Secondary | ICD-10-CM | POA: Insufficient documentation

## 2019-10-17 DIAGNOSIS — R1904 Left lower quadrant abdominal swelling, mass and lump: Secondary | ICD-10-CM | POA: Diagnosis not present

## 2019-10-17 DIAGNOSIS — D500 Iron deficiency anemia secondary to blood loss (chronic): Secondary | ICD-10-CM | POA: Diagnosis not present

## 2019-10-17 DIAGNOSIS — Z9289 Personal history of other medical treatment: Secondary | ICD-10-CM | POA: Diagnosis not present

## 2019-10-17 DIAGNOSIS — O99013 Anemia complicating pregnancy, third trimester: Secondary | ICD-10-CM | POA: Diagnosis not present

## 2019-10-17 DIAGNOSIS — Z8619 Personal history of other infectious and parasitic diseases: Secondary | ICD-10-CM | POA: Diagnosis not present

## 2019-10-21 DIAGNOSIS — R1904 Left lower quadrant abdominal swelling, mass and lump: Secondary | ICD-10-CM | POA: Diagnosis not present

## 2019-10-27 DIAGNOSIS — D500 Iron deficiency anemia secondary to blood loss (chronic): Secondary | ICD-10-CM | POA: Diagnosis not present

## 2019-10-28 DIAGNOSIS — Z6841 Body Mass Index (BMI) 40.0 and over, adult: Secondary | ICD-10-CM | POA: Diagnosis not present

## 2019-10-28 DIAGNOSIS — I10 Essential (primary) hypertension: Secondary | ICD-10-CM | POA: Diagnosis not present

## 2019-10-28 DIAGNOSIS — R011 Cardiac murmur, unspecified: Secondary | ICD-10-CM | POA: Diagnosis not present

## 2019-11-03 DIAGNOSIS — D500 Iron deficiency anemia secondary to blood loss (chronic): Secondary | ICD-10-CM | POA: Diagnosis not present

## 2019-12-03 DIAGNOSIS — D500 Iron deficiency anemia secondary to blood loss (chronic): Secondary | ICD-10-CM | POA: Diagnosis not present

## 2019-12-03 DIAGNOSIS — Z6841 Body Mass Index (BMI) 40.0 and over, adult: Secondary | ICD-10-CM | POA: Diagnosis not present

## 2019-12-03 DIAGNOSIS — R7303 Prediabetes: Secondary | ICD-10-CM | POA: Diagnosis not present

## 2019-12-03 DIAGNOSIS — E559 Vitamin D deficiency, unspecified: Secondary | ICD-10-CM | POA: Diagnosis not present

## 2019-12-03 DIAGNOSIS — I10 Essential (primary) hypertension: Secondary | ICD-10-CM | POA: Diagnosis not present

## 2019-12-17 DIAGNOSIS — E559 Vitamin D deficiency, unspecified: Secondary | ICD-10-CM | POA: Diagnosis not present

## 2019-12-17 DIAGNOSIS — D500 Iron deficiency anemia secondary to blood loss (chronic): Secondary | ICD-10-CM | POA: Diagnosis not present

## 2019-12-17 DIAGNOSIS — I10 Essential (primary) hypertension: Secondary | ICD-10-CM | POA: Diagnosis not present

## 2019-12-17 DIAGNOSIS — R7303 Prediabetes: Secondary | ICD-10-CM | POA: Diagnosis not present

## 2020-03-29 ENCOUNTER — Ambulatory Visit: Payer: Medicaid Other | Admitting: Student

## 2020-04-22 ENCOUNTER — Telehealth: Payer: Self-pay | Admitting: General Practice

## 2020-04-22 NOTE — Telephone Encounter (Signed)
I called the patient on 04/21/20 and 04/22/20 to get her scheduled with the Managed [facility] Care Team. There was no answer on 11/3 but I was able to leave her a voicemail on 04/22/20. I left my name and my direct number for her to call back. I will make one another attempt to reach this patient.

## 2020-06-20 ENCOUNTER — Other Ambulatory Visit: Payer: Self-pay | Admitting: Nurse Practitioner

## 2020-06-21 ENCOUNTER — Ambulatory Visit: Payer: Medicaid Other | Admitting: Nurse Practitioner

## 2020-07-05 ENCOUNTER — Telehealth: Payer: Self-pay | Admitting: General Practice

## 2020-07-05 NOTE — Telephone Encounter (Signed)
Today was the third attempt to reach this patient to get her scheduled with the High Risk Managed [employer] RN Case Manager. I left my name and number for her to return my call.

## 2020-07-19 ENCOUNTER — Other Ambulatory Visit (HOSPITAL_COMMUNITY)
Admission: RE | Admit: 2020-07-19 | Discharge: 2020-07-19 | Disposition: A | Discharge status: Home / Self Care | Payer: Medicaid Other | Source: Ambulatory Visit | Attending: Student | Admitting: Student

## 2020-07-19 ENCOUNTER — Ambulatory Visit (INDEPENDENT_AMBULATORY_CARE_PROVIDER_SITE_OTHER): Payer: Medicaid Other | Admitting: Obstetrics and Gynecology

## 2020-07-19 ENCOUNTER — Other Ambulatory Visit: Payer: Self-pay

## 2020-07-19 ENCOUNTER — Encounter: Payer: Self-pay | Admitting: Obstetrics and Gynecology

## 2020-07-19 VITALS — BP 175/106 | HR 86 | Ht 65.0 in | Wt >= 6400 oz

## 2020-07-19 DIAGNOSIS — Z124 Encounter for screening for malignant neoplasm of cervix: Secondary | ICD-10-CM | POA: Diagnosis not present

## 2020-07-19 DIAGNOSIS — Z3042 Encounter for surveillance of injectable contraceptive: Secondary | ICD-10-CM

## 2020-07-19 DIAGNOSIS — Z01419 Encounter for gynecological examination (general) (routine) without abnormal findings: Secondary | ICD-10-CM

## 2020-07-19 LAB — POCT PREGNANCY, URINE: Preg Test, Ur: NEGATIVE

## 2020-07-19 MED ORDER — MEDROXYPROGESTERONE ACETATE 150 MG/ML IM SUSP
150.0000 mg | Freq: Once | INTRAMUSCULAR | Status: AC
  Administered 2020-07-19: 150 mg via INTRAMUSCULAR

## 2020-07-19 NOTE — Progress Notes (Signed)
Obstetrics and Gynecology Annual Patient Evaluation  Appointment Date: 07/19/2020  OBGYN Clinic: [facility]  Referring Provider: No ref. provider found  Chief Complaint:  Chief Complaint  Patient presents with  . Gynecologic Exam    History of Present Illness: Krystal Hartman is a 30 y.o. African-American [id number] (Patient's last menstrual period was 07/13/2020 (exact date).), seen for the above chief complaint. Her past medical history is significant for BMI 69, HTN, h/o c-section, h/o embedded IUD  Patient for gastric sleeve later this year and needs to be up to date on pap smear and wants to start something for contraception. LMP just ending   Review of Systems: Pertinent items noted in HPI and remainder of comprehensive ROS otherwise negative.   Patient Active Problem List   Diagnosis Date Noted  . History of blood transfusion 10/17/2019  . Iron deficiency anemia due to chronic blood loss 10/17/2019  . Left lower quadrant abdominal mass 10/17/2019  . Vitamin D deficiency 10/17/2019  . Pre-diabetes 10/03/2019  . ASCUS of cervix with negative high risk HPV 08/21/2019  . Essential hypertension 08/21/2019  . Morbid obesity ([place]) 05/25/2011  . Hidradenitis suppurativa 05/25/2011  . HSV-2 (herpes simplex virus 2) infection 05/25/2011  . History of herpes genitalis 05/25/2011    Past Medical History:  Past Medical History:  Diagnosis Date  . Anemia   . Boil    on side  . Cesarean section wound complication 08/25/4534   Consider wound vac   . Hx of preeclampsia, prior pregnancy, currently pregnant 11/21/2018  . Hypertension    started on BP med yesterday  . 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 None present at PAT appt.  Marland Kitchen PONV (postoperative nausea and vomiting) 2013   pt. states she had a lot of nausea and vomiting after her  last c/s  here at [facility]  . Pregnancy induced hypertension   . Previous cesarean delivery, antepartum condition or complication 09/23/8030   For tertiary C/S, no tubal   . Severe preeclampsia superimposed on chronic hypertension, delivered 06/10/2011   [ ]  Aspirin 81 mg daily after 12 weeks Current antihypertensives:  None   Baseline and surveillance labs (pulled in from [facility], refresh links as needed)  Lab Results Component Value Date  PLT 306 12/02/2018  CREATININE 0.63 12/02/2018  AST 14 12/02/2018  ALT 11 12/02/2018  PROTEIN24HR 156 (H) 05/27/2011   Antenatal Testing CHTN - O10.919  Group I   BP < 140/90, no meds, no preeclampsia, AGA, nml AF    Past Surgical History:  Past Surgical History:  Procedure Laterality Date  . CESAREAN SECTION    . CESAREAN SECTION  06/22/2011   Procedure: CESAREAN SECTION;  Surgeon: Eldred Manges, MD;  Location: [facility];  Service: Gynecology;  Laterality: N/A;  MRSA positive, Latex Allergy  . CESAREAN SECTION N/A 12/11/2012   Procedure: REPEAT CESAREAN SECTION;  Surgeon: Woodroe Mode, MD;  Location: [facility];  Service: Obstetrics;  Laterality: N/A;  . CESAREAN SECTION MULTI-GESTATIONAL N/A 05/06/2019   Procedure: CESAREAN SECTION MULTI-GESTATIONAL;  Surgeon: Osborne Oman, MD;  Location: [facility];  Service: Obstetrics;  Laterality: N/A;  . EYE SURGERY    . IUD REMOVAL N/A 08/29/2017   Procedure: HYSTEROSCOPY WITH INTRAUTERINE DEVICE (IUD) REMOVAL;  Surgeon: Osborne Oman, MD;  Location: [facility];  Service: Gynecology;  Laterality: N/A;    Past Obstetrical History:  OB History  Gravida Para Term Preterm AB Living  5 4 3 1 1 5   SAB IAB Ectopic Multiple Live Births  1 0 0 1 5    # Outcome Date GA Lbr Len/2nd Weight Sex Delivery Anes PTL Lv  5A Preterm 05/06/19 [redacted]w[redacted]d 5 lb 0.4 oz (2.28 kg) M CS-LTranv Spinal, EPI  LIV     Birth Comments: Late preterm female with respiratory distress and hypospadius  5B Preterm 05/06/19 369w4d5 lb 12.4 oz (2.62  kg) M CS-LTranv Spinal, EPI  LIV  4 Term 12/11/12 3953w0d lb 5.7 oz (4.245 kg) M CS-LTranv Spinal, EPI  LIV     Birth Comments: repeat c/s  3 Term 2013 37w106w2d CS-LTranv   LIV     Birth Comments: repeat c/s, Hospitlallized for Polyhydraminous , preeclampsia for 2 months  2 SAB 2012 57w0d14w0d    1 Term 11/10/09 65w0d37w0d 3 oz (3.26 kg) M CS-LTranv   LIV     Birth Comments: facial presentation, c/s due to ftp, IOL fo preeclampsia.     Past Gynecological History: As per HPI. Periods: qmonth, regular 3-5  days, not heavy or painful History of Pap Smear(s): Yes.   Last pap 2020, which was negative She is currently using no method for contraception.   Social History:  Social History   Socioeconomic History  . Marital status: Married    Spouse name: Not on file  . Number of children: Not on file  . Years of education: Not on file  . Highest education level: Not on file  Occupational History  . Not on file  Tobacco Use  . Smoking status: Never Smoker  . Smokeless tobacco: Never Used  Vaping Use  . Vaping Use: Never used  Substance and Sexual Activity  . Alcohol use: No  . Drug use: No  . Sexual activity: Yes    Birth control/protection: None  Other Topics Concern  . Not on file  Social History Narrative  . Not on file   Social Determinants of Health   Financial Resource Strain: Not on file  Food Insecurity: Not on file  Transportation Needs: Not on file  Physical Activity: Not on file  Stress: Not on file  Social Connections: Not on file  Intimate Partner Violence: Not on file    Family History:  Family History  Problem Relation Age of Onset  . Diabetes Mother   . Diabetes Maternal Aunt   . Diabetes Father   . Heart disease Father   . Colon cancer Father   . Cancer Maternal Grandmother    She denies any female cancers, bleeding or blood clotting disorders.    Medications We administered medroxyPROGESTERone. Current Outpatient Medications  Medication  Sig Dispense Refill  . acetaminophen (TYLENOL) 325 MG tablet Take 650 mg by mouth every 6 (six) hours as needed for mild pain or headache.     . AMBULATORY NON FORMULARY MEDICATION 1 Device by Other route once a week. Medication Name: Blood Pressure Cuff, extra large Monitored regularly at home ICD 10  O09.90,  O09.299 1 kit 0  . ergocalciferol (VITAMIN D2) 1.25 MG (50000 UT) capsule Take by mouth.    . hydrochlorothiazide (HYDRODIURIL) 25 MG tablet Take 1 tablet (25 mg total) by mouth daily. 90 tablet 0  . hydrochlorothiazide (HYDRODIURIL) 25 MG tablet Take by mouth.    . ibupMarland Kitchenofen (ADVIL) 200 MG tablet Take 800 mg  by mouth every 6 (six) hours as needed for moderate pain.    Marland Kitchen losartan (COZAAR) 100 MG tablet Take by mouth.    . Pediatric Multivitamins-Iron (FLINTSTONES COMPLETE) 18 MG CHEW Chew by mouth.    . ferrous sulfate 325 (65 FE) MG tablet Take 1 tablet (325 mg total) by mouth 2 (two) times daily with a meal. (Patient not taking: Reported on 07/19/2020) 60 tablet 3  . Insulin Pen Needle (NOVOFINE PEN NEEDLE) 32G X 6 MM MISC Use as directed with Victoza (Patient not taking: Reported on 07/19/2020)    . lisinopril (ZESTRIL) 40 MG tablet Take 1 tablet (40 mg total) by mouth daily. (Patient not taking: Reported on 07/19/2020) 90 tablet 0  . NIFEdipine (PROCARDIA-XL/NIFEDICAL-XL) 30 MG 24 hr tablet Take 2 tablets (60 mg total) by mouth daily. Pt states that she now takes 2 tablets 1 time a day 120 tablet 0   No current facility-administered medications for this visit.    Allergies Latex and Pork-derived products   Physical Exam:  BP (!) 175/106 (BP Location: Left Arm)   Pulse 86   Ht 5' 5"  (1.651 m)   Wt (!) 416 lb 1.6 oz (188.7 kg)   LMP 07/13/2020 (Exact Date)   BMI 69.24 kg/m  Body mass index is 69.24 kg/m. General appearance: Well nourished, well developed female in no acute distress.  Cardiovascular: normal s1 and s2.  No murmurs, rubs or gallops. Respiratory:  Clear to  auscultation bilateral. Normal respiratory effort Abdomen: positive bowel sounds and no masses, hernias; diffusely non tender to palpation, non distended Breasts: pt denies any breast s/s Neuro/Psych:  Normal mood and affect.  Skin:  Warm and dry.  Lymphatic:  No inguinal lymphadenopathy.   Pelvic exam: is limited by body habitus EGBUS: within normal limits Vagina: within normal limits and with no blood or discharge in the vault Cervix: normal appearing cervix without tenderness, discharge or lesions. Easily seen with regular graves speculum Uterus:  nonenlarged and non tender Adnexa:  normal adnexa and no mass, fullness, tenderness Rectovaginal: deferred  Laboratory: upt neg  Radiology: none  Assessment: pt doing well  Plan:  1. Cervical cancer screening - Cytology - PAP( [place])  2. Well woman exam Routine care. Pt declines STD screening  3. Encounter for surveillance of injectable contraceptive Recommend non estrogen options only. Pt to do depo provera now. She may want to do mirena again. She had good success with it but she did need to go to the OR last time in 2016 to get it removed.  - medroxyPROGESTERone (DEPO-PROVERA) injection 150 mg  Orders Placed This Encounter  Procedures  . Pregnancy, urine POC    RTC 8mfor f/u depo shot.   CDurene RomansMD Attending Center for [employer](Fish farm manager

## 2020-07-21 LAB — CYTOLOGY - PAP
Adequacy: ABSENT
Comment: NEGATIVE
Diagnosis: NEGATIVE
High risk HPV: NEGATIVE

## 2020-09-02 ENCOUNTER — Other Ambulatory Visit: Payer: Self-pay | Admitting: Lactation Services

## 2020-09-02 MED ORDER — IBUPROFEN 600 MG PO TABS: 600.0000 mg | ORAL_TABLET | Freq: Four times a day (QID) | ORAL | 0 refills | Status: DC
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
## 2021-04-18 DIAGNOSIS — K219 Gastro-esophageal reflux disease without esophagitis: Secondary | ICD-10-CM | POA: Insufficient documentation

## 2021-04-19 HISTORY — PX: LAPAROSCOPIC GASTRIC SLEEVE RESECTION: SHX5895

## 2021-04-20 DIAGNOSIS — Z9884 Bariatric surgery status: Secondary | ICD-10-CM | POA: Insufficient documentation

## 2021-11-23 ENCOUNTER — Ambulatory Visit (INDEPENDENT_AMBULATORY_CARE_PROVIDER_SITE_OTHER): Payer: Medicaid Other

## 2021-11-23 DIAGNOSIS — Z3201 Encounter for pregnancy test, result positive: Secondary | ICD-10-CM | POA: Diagnosis not present

## 2021-11-23 LAB — POCT PREGNANCY, URINE: Preg Test, Ur: POSITIVE — AB

## 2021-11-23 NOTE — Progress Notes (Signed)
Pt left urine for UPT resulting positive.   Medications/allergies reviewed.  Pt reports LMP 10/03/21 making her EDD 07/10/22, and 7w 2d today.  Pt states that she had a gastric sleeve in November 2022.  Pt denies VB however is having some mild menstrual like pain in her lower abdomen that is intermittent that she feels is related to gas pains.  Pt advised when to go to MAU and that someone from the front desk will call her to schedule her NEW OB intake appt.  Pt verbalized understanding with no further questions.   Addison Naegeli, RN  11/23/21

## 2021-11-23 NOTE — Patient Instructions (Signed)

## 2021-12-14 ENCOUNTER — Telehealth: Payer: Medicaid Other

## 2021-12-14 ENCOUNTER — Telehealth (INDEPENDENT_AMBULATORY_CARE_PROVIDER_SITE_OTHER): Payer: Medicaid Other

## 2021-12-14 DIAGNOSIS — O0990 Supervision of high risk pregnancy, unspecified, unspecified trimester: Secondary | ICD-10-CM

## 2021-12-14 DIAGNOSIS — O10019 Pre-existing essential hypertension complicating pregnancy, unspecified trimester: Secondary | ICD-10-CM

## 2021-12-14 DIAGNOSIS — Z3A00 Weeks of gestation of pregnancy not specified: Secondary | ICD-10-CM

## 2021-12-14 MED ORDER — BLOOD PRESSURE MONITORING DEVI: 1.0000 | 0 refills | Status: DC

## 2021-12-14 NOTE — Progress Notes (Signed)
New OB Intake  I connected with  Krystal Hartman on 12/14/21 at  1:15 PM EDT by MyChart Video Visit and verified that I am speaking with the correct person using two identifiers. Nurse is located at [facility] and pt is located at [employer].  I discussed the limitations, risks, security and privacy concerns of performing an evaluation and management service by telephone and the availability of in person appointments. I also discussed with the patient that there may be a patient responsible charge related to this service. The patient expressed understanding and agreed to proceed.  I explained I am completing New OB Intake today. We discussed her EDD of 07/10/22 that is based on LMP of 09/30/21. Pt is G6/P3. I reviewed her allergies, medications, Medical/Surgical/OB history, and appropriate screenings. I informed her of [facility] services. Based on history, this is a/an  pregnancy complicated by hypertension .   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   MyChart/Babyscripts MyChart access verified. I explained pt will have some visits in office and some virtually. Babyscripts instructions given and order placed. Patient verifies receipt of registration text/e-mail. Account successfully created and app downloaded.  Blood Pressure Cuff  Blood pressure cuff ordered for patient to pick-up from [employer]. Explained after first prenatal appt pt  will check weekly and document in Babyscripts.  Weight scale: Patient does / does not  have weight scale. Weight scale ordered for patient to pick up from [employer].   Anatomy US Explained first scheduled [place] will be around 19 weeks. Anatomy US scheduled for 02/15/22 at 1030. Pt notified to arrive at 1015. Scheduled AFP lab only appointment if CenteringPregnancy pt for same day as anatomy US.   Labs Discussed Avelina Laine genetic screening with patient. Would like both Panorama and Horizon drawn at new OB visit.Also if interested in genetic testing, tell patient she will need AFP 15-21 weeks to complete genetic testing .Routine prenatal labs needed.  Covid Vaccine Patient has covid vaccine.   Is patient a CenteringPregnancy candidate?  Not a Candidate Declined due to NA Not a candidate due to [facility] on Meds Centering Patient" indicated on sticky note   Is patient a Mom+Baby Combined Care candidate?  Not a candidate    Scheduled with Mom+Baby provider    Is patient interested in Waterbirth?  No   "Interested in [employer] - Schedule next visit with CNM" on sticky note  Informed patient of [facility] website  and placed link in her AVS.   Social Determinants of Health Food Insecurity: Patient denies food insecurity. WIC Referral: Patient is interested in referral to [facility].  Transportation: Patient denies transportation needs. Childcare: Discussed no children allowed at ultrasound appointments. Offered childcare services; patient declines childcare services at this time.  Send link to Pregnancy Navigators   Placed OB Box on problem list and updated  First visit review I reviewed new OB appt with pt. I explained she will  have a pelvic exam, ob bloodwork with genetic screening, and PAP smear. Explained pt will be seen by Corlis Hove, NP at first visit; encounter routed to appropriate provider. Explained that patient will be seen by pregnancy navigator following visit with provider.  [facility] information placed in AVS.   Henrietta Dine, CMA 12/14/2021  1:33 PM

## 2022-01-04 ENCOUNTER — Other Ambulatory Visit: Payer: Self-pay

## 2022-01-04 ENCOUNTER — Other Ambulatory Visit (HOSPITAL_COMMUNITY)
Admission: RE | Admit: 2022-01-04 | Discharge: 2022-01-04 | Disposition: A | Discharge status: Home / Self Care | Payer: Medicaid Other | Source: Ambulatory Visit | Attending: Student | Admitting: Student

## 2022-01-04 ENCOUNTER — Encounter: Payer: Self-pay | Admitting: Student

## 2022-01-04 ENCOUNTER — Ambulatory Visit (INDEPENDENT_AMBULATORY_CARE_PROVIDER_SITE_OTHER): Payer: Medicaid Other | Admitting: Student

## 2022-01-04 VITALS — BP 130/74 | HR 77 | Wt 321.5 lb

## 2022-01-04 DIAGNOSIS — O0990 Supervision of high risk pregnancy, unspecified, unspecified trimester: Secondary | ICD-10-CM | POA: Insufficient documentation

## 2022-01-04 DIAGNOSIS — O10919 Unspecified pre-existing hypertension complicating pregnancy, unspecified trimester: Secondary | ICD-10-CM

## 2022-01-04 DIAGNOSIS — Z3A13 13 weeks gestation of pregnancy: Secondary | ICD-10-CM

## 2022-01-04 DIAGNOSIS — B009 Herpesviral infection, unspecified: Secondary | ICD-10-CM

## 2022-01-04 DIAGNOSIS — O0992 Supervision of high risk pregnancy, unspecified, second trimester: Secondary | ICD-10-CM

## 2022-01-04 DIAGNOSIS — O99210 Obesity complicating pregnancy, unspecified trimester: Secondary | ICD-10-CM

## 2022-01-04 DIAGNOSIS — O98512 Other viral diseases complicating pregnancy, second trimester: Secondary | ICD-10-CM

## 2022-01-04 DIAGNOSIS — Z98891 History of uterine scar from previous surgery: Secondary | ICD-10-CM

## 2022-01-04 DIAGNOSIS — R7303 Prediabetes: Secondary | ICD-10-CM

## 2022-01-04 DIAGNOSIS — Z862 Personal history of diseases of the blood and blood-forming organs and certain disorders involving the immune mechanism: Secondary | ICD-10-CM

## 2022-01-04 DIAGNOSIS — O09299 Supervision of pregnancy with other poor reproductive or obstetric history, unspecified trimester: Secondary | ICD-10-CM

## 2022-01-04 NOTE — Progress Notes (Signed)
Pt reported that her Cardiologist advised her to stop taking Losartan-100 mg due to pregnancy.

## 2022-01-04 NOTE — Progress Notes (Signed)
Informal bedside ultrasound performed for FHR, FHR 152bpm  Chase Caller RN BSN 01/04/22

## 2022-01-05 LAB — CBC/D/PLT+RPR+RH+ABO+RUBIGG...
Antibody Screen: NEGATIVE
Basophils Absolute: 0 10*3/uL (ref 0.0–0.2)
Basos: 0 %
EOS (ABSOLUTE): 0.1 10*3/uL (ref 0.0–0.4)
Eos: 2 %
HCV Ab: NONREACTIVE
HIV Screen 4th Generation wRfx: NONREACTIVE
Hematocrit: 31.8 % — ABNORMAL LOW (ref 34.0–46.6)
Hemoglobin: 10.2 g/dL — ABNORMAL LOW (ref 11.1–15.9)
Hepatitis B Surface Ag: NEGATIVE
Immature Grans (Abs): 0 10*3/uL (ref 0.0–0.1)
Immature Granulocytes: 0 %
Lymphocytes Absolute: 1.9 10*3/uL (ref 0.7–3.1)
Lymphs: 35 %
MCH: 29.4 pg (ref 26.6–33.0)
MCHC: 32.1 g/dL (ref 31.5–35.7)
MCV: 92 fL (ref 79–97)
Monocytes Absolute: 0.3 10*3/uL (ref 0.1–0.9)
Monocytes: 5 %
Neutrophils Absolute: 3.1 10*3/uL (ref 1.4–7.0)
Neutrophils: 58 %
Platelets: 233 10*3/uL (ref 150–450)
RBC: 3.47 x10E6/uL — ABNORMAL LOW (ref 3.77–5.28)
RDW: 14.7 % (ref 11.7–15.4)
RPR Ser Ql: NONREACTIVE
Rh Factor: POSITIVE
Rubella Antibodies, IGG: 2.09 index (ref >0.99)
WBC: 5.4 10*3/uL (ref 3.4–10.8)

## 2022-01-05 LAB — COMPREHENSIVE METABOLIC PANEL
ALT: 7 IU/L (ref 0–32)
AST: 11 IU/L (ref 0–40)
Albumin/Globulin Ratio: 1 — ABNORMAL LOW (ref 1.2–2.2)
Albumin: 3.8 g/dL — ABNORMAL LOW (ref 3.9–4.9)
Alkaline Phosphatase: 66 IU/L (ref 44–121)
BUN/Creatinine Ratio: 19 (ref 9–23)
BUN: 11 mg/dL (ref 6–20)
Bilirubin Total: 0.3 mg/dL (ref 0.0–1.2)
CO2: 21 mmol/L (ref 20–29)
Calcium: 9 mg/dL (ref 8.7–10.2)
Chloride: 103 mmol/L (ref 96–106)
Creatinine, Ser: 0.58 mg/dL (ref 0.57–1.00)
Globulin, Total: 3.7 g/dL (ref 1.5–4.5)
Glucose: 77 mg/dL (ref 70–99)
Potassium: 3.8 mmol/L (ref 3.5–5.2)
Sodium: 137 mmol/L (ref 134–144)
Total Protein: 7.5 g/dL (ref 6.0–8.5)
eGFR: 123 mL/min/{1.73_m2} (ref >59)

## 2022-01-05 LAB — GC/CHLAMYDIA PROBE AMP (~~LOC~~) NOT AT ARMC
Chlamydia: NEGATIVE
Comment: NEGATIVE
Comment: NORMAL
Neisseria Gonorrhea: NEGATIVE

## 2022-01-05 LAB — HCV INTERPRETATION

## 2022-01-05 LAB — HEMOGLOBIN A1C
Est. average glucose Bld gHb Est-mCnc: 94 mg/dL
Hgb A1c MFr Bld: 4.9 % (ref 4.8–5.6)

## 2022-01-05 NOTE — Progress Notes (Signed)
History:   Krystal Hartman is a 31 y.o. [id number] at 96w3dby LMP being seen today for her first obstetrical visit.  Her obstetrical history is significant for obesity, pre-eclampsia, and chronic hypertension, 4 previous c-sections, HSV-2, and gastric sleeve procedure(November 2022)  . Patient does intend to breast feed. Pregnancy history fully reviewed.  Patient reports no complaints.      HISTORY: OB History  Gravida Para Term Preterm AB Living  _0 SAB IAB Ectopic Multiple Live Births  1 0 0 1 5    # Outcome Date GA Lbr Len/2nd Weight Sex Delivery Anes PTL Lv  6 Current           5A Preterm 05/06/19 317w4d5 lb 0.4 oz (2.28 kg) M CS-LTranv Spinal, EPI  LIV     Birth Comments: Late preterm female with respiratory distress and hypospadius     Name: Discher,BOY Lynnox     Apgar1: 2  Apgar5: 8  5B Preterm 05/06/19 3486w4d lb 12.4 oz (2.62 kg) M CS-LTranv Spinal, EPI  LIV     Name: Chevez,BOYB Natalin     Apgar1: 5  Apgar5: 8  4 Term 12/11/12 39w63w0dlb 5.7 oz (4.245 kg) M CS-LTranv Spinal, EPI  LIV     Birth Comments: repeat c/s     Name: Saxton,BOY Annalisse     Apgar1: 8  Apgar5: 9  3 Term 2013 37w228w2dCS-LTranv   LIV     Birth Comments: repeat c/s, Hospitlallized for Polyhydraminous , preeclampsia for 2 months  2 SAB 2012 [redacted]w[redacted]d [redacted]w[redacted]d   1 Term 11/10/09 [redacted]w[redacted]d 79w0d3 oz (3.26 kg) M CS-LTranv   LIV     Birth Comments: facial presentation, c/s due to ftp, IOL fo preeclampsia.     Last pap smear was done Jan. 2022 and was normal  Past Medical History:  Diagnosis Date   Anemia    Boil    on side   Cesarean section wound complication 06/24/2014/1/9509ider wound vac    Hx of preeclampsia, prior pregnancy, currently pregnant 11/21/2018   Hypertension    started on BP med yesterday   MRSA (methicillin resistant Staphylococcus aureus) colonization    chronic intermittent problem with boils-occassionally test positive for MRSA. None present at PAT appt.   PONV  (postoperative nausea and vomiting) 2013   pt. states she had a lot of nausea and vomiting after her last c/s  [facility]    Previous cesarean delivery, antepartum condition or complication 08/20/2011/2/6712tertiary C/S, no tubal    Severe preeclampsia superimposed on chronic hypertension, delivered 06/10/2011   _1  Aspirin 81 mg daily after 12 weeks Current antihypertensives:  None   Baseline and surveillance labs (pulled in from EPIC, [facility] links as needed)  Lab Results Component Value Date  PLT 306 12/02/2018  CREATININE 0.63 12/02/2018  AST 14 12/02/2018  ALT 11 12/02/2018  PROTEIN24HR 156 (H) 05/27/2011   Antenatal Testing CHTN - O10.919  Group I   BP < 140/90, no meds, no preeclampsia, AGA, nml AF   Past Surgical History:  Procedure Laterality Date   CESAREAN SECTION     CESAREAN SECTION  06/22/2011   Procedure: CESAREAN SECTION;  Surgeon: VanessaEldred MangesLocation: [facility];[place];  Laterality: N/A;  MRSA positive, Latex  Allergy   CESAREAN SECTION N/A 12/11/2012   Procedure: REPEAT CESAREAN SECTION;  Surgeon: Woodroe Mode, MD;  Location: [facility];  Service: Obstetrics;  Laterality: N/A;   CESAREAN SECTION MULTI-GESTATIONAL N/A 05/06/2019   Procedure: CESAREAN SECTION MULTI-GESTATIONAL;  Surgeon: Osborne Oman, MD;  Location: [facility];  Service: Obstetrics;  Laterality: N/A;   EYE SURGERY     IUD REMOVAL N/A 08/29/2017   Procedure: HYSTEROSCOPY WITH INTRAUTERINE DEVICE (IUD) REMOVAL;  Surgeon: Osborne Oman, MD;  Location: [place] [facility];  Service: Gynecology;  Laterality: N/A;   LAPAROSCOPIC GASTRIC SLEEVE RESECTION  04/2021   Family History  Problem Relation Age of Onset   Diabetes Mother    Diabetes Maternal Aunt    Diabetes Father    Heart disease Father    Colon cancer Father    Cancer Maternal Grandmother    Social History   Tobacco Use   Smoking status: Never   Smokeless tobacco: Never  Vaping Use    Vaping Use: Never used  Substance Use Topics   Alcohol use: No   Drug use: No   Allergies  Allergen Reactions   Latex Hives and Itching   Pork-Derived Products Other (See Comments)    Does not eat due to religious reasons.   Current Outpatient Medications on File Prior to Visit  Medication Sig Dispense Refill   Blood Pressure Monitoring DEVI 1 each by Does not apply route once a week. 1 each 0   calcium carbonate (OS-CAL) 1250 (500 Ca) MG chewable tablet Chew 1 tablet by mouth daily.     ergocalciferol (VITAMIN D2) 1.25 MG (50000 UT) capsule Take by mouth.     ferrous sulfate 325 (65 FE) MG tablet Take 1 tablet (325 mg total) by mouth 2 (two) times daily with a meal. 60 tablet 3   Multiple Vitamin (MULTIVITAMIN) tablet Take 1 tablet by mouth daily.     acetaminophen (TYLENOL) 325 MG tablet Take 650 mg by mouth every 6 (six) hours as needed for mild pain or headache.  (Patient not taking: Reported on 11/23/2021)     AMBULATORY NON FORMULARY MEDICATION 1 Device by Other route once a week. Medication Name: Blood Pressure Cuff, extra large Monitored regularly at home ICD 10  O09.90,  O09.299 (Patient not taking: Reported on 11/23/2021) 1 kit 0   ibuprofen (ADVIL) 200 MG tablet Take 800 mg by mouth every 6 (six) hours as needed for moderate pain. (Patient not taking: Reported on 12/14/2021)     ibuprofen (ADVIL) 600 MG tablet Take 1 tablet (600 mg total) by mouth 4 (four) times daily. Take 1 tablet every 6 hours for 4 days. (Patient not taking: Reported on 11/23/2021) 16 tablet 0   losartan (COZAAR) 100 MG tablet Take by mouth. (Patient not taking: Reported on 01/04/2022)     No current facility-administered medications on file prior to visit.    Review of Systems Pertinent items noted in HPI and remainder of comprehensive ROS otherwise negative. Physical Exam:   Vitals:   01/04/22 1424  BP: 130/74  Pulse: 77  Weight: (!) 321 lb 8 oz (145.8 kg)   Fetal Heart Rate (bpm): 152  System:  General: well-developed, well-nourished female in no acute distress   Breasts:  normal appearance, no masses or tenderness bilaterally   Skin: normal coloration and turgor, no rashes   Neurologic: oriented, normal, negative, normal mood   Extremities: normal strength, tone, and muscle mass, ROM of all joints is normal  HEENT Strabismus of eyes, extraocular movement intact and sclera clear, anicteric   Mouth/Teeth mucous membranes moist, pharynx normal without lesions and dental hygiene good   Neck supple and no masses   Cardiovascular: regular rate and rhythm   Respiratory:  no respiratory distress, normal breath sounds   Abdomen: obese, soft, non-tender; bowel sounds normal; no masses,  no organomegaly    Bedside Ultrasound for FHR check: Patient informed that the ultrasound is considered a limited obstetric ultrasound and is not intended to be a complete ultrasound exam.  Patient also informed that the ultrasound is not being completed with the intent of assessing for fetal or placental anomalies or any pelvic abnormalities.  Explained that the purpose of today's ultrasound is to assess for fetal heart rate.  Patient acknowledges the purpose of the exam and the limitations of the study.     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 Supervision of high risk pregnancy, antepartum - Patient appears  well upon today's exam - Comprehensive metabolic panel - Culture, OB Urine - GC/Chlamydia probe amp ([place])not at [facility] - CBC/D/Plt+RPR+Rh+ABO+RubIgG... - Protein / creatinine ratio, urine - TSH - Hemoglobin A1c - Panorama Prenatal Test Full Panel - HORIZON CUSTOM  2. HTN in pregnancy, chronic - Patient is followed by cardiology. Per cardiology recs, losartan discontinued  and plan for f/u with cardiologist in ~4 weeks for reassessment of need for medication management.  - Comprehensive metabolic panel - Protein / creatinine ratio, urine   3. History of pre-eclampsia in prior pregnancy, currently pregnant - Patient is candidate for bASA, however this is contraindicated in patient with h/o gastric sleeve procedure  4. History of C-section - Prio c/s x4  5. 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 History of iron deficiency anemia - CBC collected today  7. Pre-diabetes - early Hgb A1C collected today  8. Obesity affecting pregnancy, antepartum -  Had gastric sleeve procedure < 1 year prior to conception  - Patient will need serial growth u/s per MFM guidelines   Initial labs drawn. Continue prenatal vitamins. Genetic Screening discussed, First trimester screen, Quad screen, and NIPS: requested. Ultrasound discussed; fetal anatomic survey: ordered & scheduled Problem list reviewed and updated. The nature of [facility] with multiple MDs and other Advanced Practice Providers was explained to patient; also emphasized that residents, students are part of our team. Routine obstetric precautions reviewed. Return in about 4 weeks (around 02/01/2022) for [facility]-PERSON, [facility]; w/ MD preferably .   Johnston Ebbs, [place] for [employer], [place]

## 2022-01-06 LAB — CULTURE, OB URINE

## 2022-01-06 LAB — URINE CULTURE, OB REFLEX

## 2022-01-10 LAB — PANORAMA PRENATAL TEST FULL PANEL:PANORAMA TEST PLUS 5 ADDITIONAL MICRODELETIONS: FETAL FRACTION: 5

## 2022-01-14 ENCOUNTER — Other Ambulatory Visit: Payer: Self-pay | Admitting: Student

## 2022-01-14 DIAGNOSIS — O99012 Anemia complicating pregnancy, second trimester: Secondary | ICD-10-CM

## 2022-01-14 MED ORDER — FERROUS SULFATE 325 (65 FE) MG PO TABS: 325.0000 mg | ORAL_TABLET | ORAL | 5 refills | Status: DC

## 2022-01-16 LAB — HORIZON CUSTOM: REPORT SUMMARY: POSITIVE — AB

## 2022-01-18 ENCOUNTER — Telehealth: Payer: Self-pay | Admitting: Lactation Services

## 2022-01-18 NOTE — Telephone Encounter (Signed)
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 Patient did not answer. LM to check her My Chart message and then call the office back with any questions or concerns as needed.

## 2022-02-01 ENCOUNTER — Encounter: Payer: Medicaid Other | Admitting: Obstetrics and Gynecology

## 2022-02-06 ENCOUNTER — Other Ambulatory Visit: Payer: Self-pay

## 2022-02-06 ENCOUNTER — Ambulatory Visit (INDEPENDENT_AMBULATORY_CARE_PROVIDER_SITE_OTHER): Payer: Medicaid Other | Admitting: Family Medicine

## 2022-02-06 VITALS — BP 127/84 | HR 87 | Wt 321.0 lb

## 2022-02-06 DIAGNOSIS — O0990 Supervision of high risk pregnancy, unspecified, unspecified trimester: Secondary | ICD-10-CM

## 2022-02-06 DIAGNOSIS — Z8619 Personal history of other infectious and parasitic diseases: Secondary | ICD-10-CM

## 2022-02-06 DIAGNOSIS — O34219 Maternal care for unspecified type scar from previous cesarean delivery: Secondary | ICD-10-CM

## 2022-02-06 DIAGNOSIS — Z141 Cystic fibrosis carrier: Secondary | ICD-10-CM | POA: Insufficient documentation

## 2022-02-06 NOTE — Progress Notes (Signed)
   PRENATAL VISIT NOTE  Subjective:  Krystal Hartman is a 31 y.o. O1Y0737 at [redacted]w[redacted]d being seen today for ongoing prenatal care.  She is currently monitored for the following issues for this high-risk pregnancy and has Morbid obesity (HCC); Hidradenitis suppurativa; HSV-2 (herpes simplex virus 2) infection; Previous cesarean section complicating pregnancy, antepartum condition or complication; ASCUS of cervix with negative high risk HPV; Essential hypertension; History of blood transfusion; History of herpes genitalis; Iron deficiency anemia due to chronic blood loss; Left lower quadrant abdominal mass; Pre-diabetes; Vitamin D deficiency; Supervision of high risk pregnancy, antepartum; Gastroesophageal reflux disease without esophagitis; S/P laparoscopic sleeve gastrectomy; and Cystic fibrosis carrier on their problem list.  Patient reports no complaints.  Contractions: Not present. Vag. Bleeding: None.  Movement: Present. Denies leaking of fluid.   The following portions of the patient's history were reviewed and updated as appropriate: allergies, current medications, past family history, past medical history, past social history, past surgical history and problem list.   Objective:   Vitals:   02/06/22 1457  BP: 127/84  Pulse: 87  Weight: (!) 321 lb (145.6 kg)    Fetal Status: Fetal Heart Rate (bpm): 152   Movement: Present     General:  Alert, oriented and cooperative. Patient is in no acute distress.  Skin: Skin is warm and dry. No rash noted.   Cardiovascular: Normal heart rate noted  Respiratory: Normal respiratory effort, no problems with respiration noted  Abdomen: Soft, gravid, appropriate for gestational age.  Pain/Pressure: Absent     Pelvic: Cervical exam deferred        Extremities: Normal range of motion.  Edema: None  Mental Status: Normal mood and affect. Normal behavior. Normal judgment and thought content.   Assessment and Plan:  Pregnancy: T0G2694 at [redacted]w[redacted]d 1.  History of herpes genitalis For ppx at 34-36 wks  2. Supervision of high risk pregnancy, antepartum Anatomy US scheduled. - AFP, Serum, Open Spina Bifida  3. Cystic fibrosis carrier Partner testing  4. Previous cesarean section complicating pregnancy, antepartum condition or complication For RCS--risks of multiple c-sections discussed at length  General obstetric precautions including but not limited to vaginal bleeding, contractions, leaking of fluid and fetal movement were reviewed in detail with the patient. Please refer to After Visit Summary for other counseling recommendations.   Return in 4 weeks (on 03/06/2022).  Future Appointments  Date Time Provider Department Center  02/15/2022 10:15 AM [facility] NURSE [facility]  02/15/2022 10:30 AM [facility] US3 [facility]  03/06/2022  3:35 PM Warden Fillers, MD [facility]    Reva Bores, MD

## 2022-02-07 ENCOUNTER — Encounter: Payer: Self-pay | Admitting: Family Medicine

## 2022-02-10 LAB — AFP, SERUM, OPEN SPINA BIFIDA
AFP MoM: 0.96
AFP Value: 30.8 ng/mL
Gest. Age on Collection Date: 18 weeks
Maternal Age At EDD: 33.3 yr
OSBR Risk 1 IN: 10000
Test Results:: NEGATIVE
Weight: 321 [lb_av]

## 2022-02-15 ENCOUNTER — Ambulatory Visit: Payer: Medicaid Other | Attending: Student

## 2022-02-15 ENCOUNTER — Encounter: Payer: Self-pay | Admitting: *Deleted

## 2022-02-15 ENCOUNTER — Other Ambulatory Visit: Payer: Self-pay | Admitting: *Deleted

## 2022-02-15 ENCOUNTER — Ambulatory Visit: Payer: Medicaid Other | Admitting: *Deleted

## 2022-02-15 VITALS — BP 144/85 | HR 83

## 2022-02-15 DIAGNOSIS — O0990 Supervision of high risk pregnancy, unspecified, unspecified trimester: Secondary | ICD-10-CM

## 2022-02-15 DIAGNOSIS — O34219 Maternal care for unspecified type scar from previous cesarean delivery: Secondary | ICD-10-CM

## 2022-02-15 DIAGNOSIS — O10912 Unspecified pre-existing hypertension complicating pregnancy, second trimester: Secondary | ICD-10-CM

## 2022-02-15 DIAGNOSIS — O09299 Supervision of pregnancy with other poor reproductive or obstetric history, unspecified trimester: Secondary | ICD-10-CM

## 2022-02-15 DIAGNOSIS — O99212 Obesity complicating pregnancy, second trimester: Secondary | ICD-10-CM

## 2022-02-20 ENCOUNTER — Encounter: Payer: Self-pay | Admitting: Family Medicine

## 2022-03-06 ENCOUNTER — Encounter: Payer: Medicaid Other | Admitting: Obstetrics and Gynecology

## 2022-03-08 NOTE — Progress Notes (Signed)
   PRENATAL VISIT NOTE  Subjective:  Krystal Hartman is a 31 y.o. [phone] at 59w0dbeing seen today for ongoing prenatal care.  She is currently monitored for the following issues for this high-risk pregnancy and has Morbid obesity ([place]; Hidradenitis suppurativa; HSV-2 (herpes simplex virus 2) infection; Previous cesarean section complicating pregnancy, antepartum condition or complication; ASCUS of cervix with negative high risk HPV; Essential hypertension; History of blood transfusion; History of herpes genitalis; Supervision of high risk pregnancy, antepartum; Gastroesophageal reflux disease without esophagitis; S/P laparoscopic sleeve gastrectomy; and Cystic fibrosis carrier on their problem list.  Patient reports no complaints.  Contractions: Not present. Vag. Bleeding: None.  Movement: Present. Denies leaking of fluid.   The following portions of the patient's history were reviewed and updated as appropriate: allergies, current medications, past family history, past medical history, past social history, past surgical history and problem list.   Objective:   Vitals:   03/13/22 1143  BP: 125/79  Pulse: 90  Weight: (!) 322 lb 14.4 oz (146.5 kg)    Fetal Status: Fetal Heart Rate (bpm): 161   Movement: Present     General:  Alert, oriented and cooperative. Patient is in no acute distress.  Skin: Skin is warm and dry. No rash noted.   Cardiovascular: Normal heart rate noted  Respiratory: Normal respiratory effort, no problems with respiration noted  Abdomen: Soft, gravid, appropriate for gestational age.  Pain/Pressure: Absent     Pelvic: Cervical exam deferred        Extremities: Normal range of motion.  Edema: None  Mental Status: Normal mood and affect. Normal behavior. Normal judgment and thought content.   Assessment and Plan:  Pregnancy: [id number] 285w0d. History of herpes genitalis Prophy after 34w  2. Previous cesarean section complicating pregnancy, antepartum  condition or complication Schedule at future appt Reviewed last op note - no excessive scar tissue noted.   3. Supervision of high risk pregnancy, antepartum Discussed birth control: she declines. She doesn't want more children but doesn't want a tubal. She also notes she has tried OCPs (can't remember them), Depo (BTB), Mirena (had to be surgically removed), Nuvaring (fell out). Husband will not get a vasectomy.  Discussed flu shot - pt declines  4. Cystic fibrosis carrier Reviewed if plans for partner testing: has the kit and plans ot have him do it.   5. S/P laparoscopic sleeve gastrectomy Can still do GTT  6. Essential hypertension No meds - BP wnl Next [place].   Preterm labor symptoms and general obstetric precautions including but not limited to vaginal bleeding, contractions, leaking of fluid and fetal movement were reviewed in detail with the patient. Please refer to After Visit Summary for other counseling recommendations.   Return in about 4 weeks (around 04/10/2022).  Future Appointments  Date Time Provider [place]/27/2023 11:15 AM [facility]/15/2022 11:30 AM [facility]/23/2023 10:15 AM ErChancy MilroyMD [facility]  PaRadene GunningMD

## 2022-03-13 ENCOUNTER — Ambulatory Visit (INDEPENDENT_AMBULATORY_CARE_PROVIDER_SITE_OTHER): Payer: Medicaid Other | Admitting: Obstetrics and Gynecology

## 2022-03-13 ENCOUNTER — Other Ambulatory Visit: Payer: Self-pay

## 2022-03-13 ENCOUNTER — Encounter: Payer: Self-pay | Admitting: Obstetrics and Gynecology

## 2022-03-13 VITALS — BP 125/79 | HR 90 | Wt 322.9 lb

## 2022-03-13 DIAGNOSIS — O34219 Maternal care for unspecified type scar from previous cesarean delivery: Secondary | ICD-10-CM

## 2022-03-13 DIAGNOSIS — Z8619 Personal history of other infectious and parasitic diseases: Secondary | ICD-10-CM

## 2022-03-13 DIAGNOSIS — O0990 Supervision of high risk pregnancy, unspecified, unspecified trimester: Secondary | ICD-10-CM

## 2022-03-13 DIAGNOSIS — Z141 Cystic fibrosis carrier: Secondary | ICD-10-CM

## 2022-03-13 DIAGNOSIS — B009 Herpesviral infection, unspecified: Secondary | ICD-10-CM

## 2022-03-13 DIAGNOSIS — Z9884 Bariatric surgery status: Secondary | ICD-10-CM

## 2022-03-13 DIAGNOSIS — I10 Essential (primary) hypertension: Secondary | ICD-10-CM

## 2022-03-15 ENCOUNTER — Other Ambulatory Visit: Payer: Self-pay | Admitting: *Deleted

## 2022-03-15 ENCOUNTER — Ambulatory Visit: Payer: Medicaid Other | Admitting: *Deleted

## 2022-03-15 ENCOUNTER — Encounter: Payer: Self-pay | Admitting: *Deleted

## 2022-03-15 ENCOUNTER — Ambulatory Visit: Payer: Medicaid Other | Attending: Obstetrics

## 2022-03-15 ENCOUNTER — Encounter: Payer: Self-pay | Admitting: Family Medicine

## 2022-03-15 VITALS — BP 131/84 | HR 89

## 2022-03-15 DIAGNOSIS — O0990 Supervision of high risk pregnancy, unspecified, unspecified trimester: Secondary | ICD-10-CM | POA: Insufficient documentation

## 2022-03-15 DIAGNOSIS — O10912 Unspecified pre-existing hypertension complicating pregnancy, second trimester: Secondary | ICD-10-CM

## 2022-03-15 DIAGNOSIS — O99212 Obesity complicating pregnancy, second trimester: Secondary | ICD-10-CM | POA: Insufficient documentation

## 2022-03-15 DIAGNOSIS — O09299 Supervision of pregnancy with other poor reproductive or obstetric history, unspecified trimester: Secondary | ICD-10-CM | POA: Diagnosis present

## 2022-03-15 DIAGNOSIS — O34219 Maternal care for unspecified type scar from previous cesarean delivery: Secondary | ICD-10-CM | POA: Insufficient documentation

## 2022-03-28 ENCOUNTER — Encounter: Payer: Self-pay | Admitting: Family Medicine

## 2022-04-10 ENCOUNTER — Encounter: Payer: Self-pay | Admitting: Obstetrics and Gynecology

## 2022-04-10 ENCOUNTER — Other Ambulatory Visit: Payer: Self-pay

## 2022-04-10 ENCOUNTER — Ambulatory Visit (INDEPENDENT_AMBULATORY_CARE_PROVIDER_SITE_OTHER): Payer: Medicaid Other | Admitting: Obstetrics and Gynecology

## 2022-04-10 VITALS — BP 131/76 | HR 81 | Wt 330.0 lb

## 2022-04-10 DIAGNOSIS — Z3A27 27 weeks gestation of pregnancy: Secondary | ICD-10-CM

## 2022-04-10 DIAGNOSIS — Z141 Cystic fibrosis carrier: Secondary | ICD-10-CM

## 2022-04-10 DIAGNOSIS — Z8619 Personal history of other infectious and parasitic diseases: Secondary | ICD-10-CM

## 2022-04-10 DIAGNOSIS — O0990 Supervision of high risk pregnancy, unspecified, unspecified trimester: Secondary | ICD-10-CM | POA: Diagnosis not present

## 2022-04-10 DIAGNOSIS — Z23 Encounter for immunization: Secondary | ICD-10-CM

## 2022-04-10 DIAGNOSIS — I10 Essential (primary) hypertension: Secondary | ICD-10-CM

## 2022-04-10 DIAGNOSIS — O34219 Maternal care for unspecified type scar from previous cesarean delivery: Secondary | ICD-10-CM

## 2022-04-10 DIAGNOSIS — Z9884 Bariatric surgery status: Secondary | ICD-10-CM

## 2022-04-10 NOTE — Patient Instructions (Signed)

## 2022-04-10 NOTE — Progress Notes (Addendum)
Subjective:  Krystal Hartman is a 31 y.o. W4Y6599 at 69w0dbeing seen today for ongoing prenatal care.  She is currently monitored for the following issues for this high-risk pregnancy and has Morbid obesity ([place]; Hidradenitis suppurativa; HSV-2 (herpes simplex virus 2) infection; Previous cesarean section complicating pregnancy, antepartum condition or complication; ASCUS of cervix with negative high risk HPV; Essential hypertension; History of blood transfusion; History of herpes genitalis; Supervision of high risk pregnancy, antepartum; Gastroesophageal reflux disease without esophagitis; S/P laparoscopic sleeve gastrectomy; and Cystic fibrosis carrier on their problem list.  Patient reports no complaints.  Contractions: Not present. Vag. Bleeding: None.  Movement: Present. Denies leaking of fluid.   The following portions of the patient's history were reviewed and updated as appropriate: allergies, current medications, past family history, past medical history, past social history, past surgical history and problem list. Problem list updated.  Objective:   Vitals:   04/10/22 1042  BP: 131/76  Pulse: 81  Weight: (!) 330 lb (149.7 kg)    Fetal Status: Fetal Heart Rate (bpm): 160   Movement: Present     General:  Alert, oriented and cooperative. Patient is in no acute distress.  Skin: Skin is warm and dry. No rash noted.   Cardiovascular: Normal heart rate noted  Respiratory: Normal respiratory effort, no problems with respiration noted  Abdomen: Soft, gravid, appropriate for gestational age. Pain/Pressure: Absent     Pelvic:  Cervical exam deferred        Extremities: Normal range of motion.  Edema: None  Mental Status: Normal mood and affect. Normal behavior. Normal judgment and thought content.   Urinalysis:      Assessment and Plan:  Pregnancy: [id number] 25w0d1. Supervision of high risk pregnancy, antepartum Stable Glucola this week - Tdap vaccine greater than or equal  to 7yo IM  2. Previous cesarean section complicating pregnancy, antepartum condition or complication Scheduled for repeat x 5 at 39 weeks  3. Essential hypertension BP stable without meds  4. History of herpes genitalis Suppression at 36 weeks  5. S/P laparoscopic sleeve gastrectomy Ok for Glucola  6. Cystic fibrosis carrier Partner has been providing testing kit  Preterm labor symptoms and general obstetric precautions including but not limited to vaginal bleeding, contractions, leaking of fluid and fetal movement were reviewed in detail with the patient. Please refer to After Visit Summary for other counseling recommendations.  Return in about 3 weeks (around 05/01/2022) for OB visit, face to face, MD only.   ErChancy MilroyMD

## 2022-04-13 ENCOUNTER — Other Ambulatory Visit: Payer: Self-pay | Admitting: General Practice

## 2022-04-13 DIAGNOSIS — O0990 Supervision of high risk pregnancy, unspecified, unspecified trimester: Secondary | ICD-10-CM

## 2022-04-14 ENCOUNTER — Other Ambulatory Visit: Payer: Medicaid Other

## 2022-04-19 ENCOUNTER — Ambulatory Visit: Payer: Medicaid Other | Admitting: *Deleted

## 2022-04-19 ENCOUNTER — Ambulatory Visit: Payer: Medicaid Other | Attending: Maternal & Fetal Medicine

## 2022-04-19 VITALS — BP 137/83 | HR 85

## 2022-04-19 DIAGNOSIS — O09293 Supervision of pregnancy with other poor reproductive or obstetric history, third trimester: Secondary | ICD-10-CM | POA: Diagnosis not present

## 2022-04-19 DIAGNOSIS — O34219 Maternal care for unspecified type scar from previous cesarean delivery: Secondary | ICD-10-CM

## 2022-04-19 DIAGNOSIS — E669 Obesity, unspecified: Secondary | ICD-10-CM | POA: Diagnosis not present

## 2022-04-19 DIAGNOSIS — O99843 Bariatric surgery status complicating pregnancy, third trimester: Secondary | ICD-10-CM

## 2022-04-19 DIAGNOSIS — O99212 Obesity complicating pregnancy, second trimester: Secondary | ICD-10-CM | POA: Diagnosis not present

## 2022-04-19 DIAGNOSIS — O10912 Unspecified pre-existing hypertension complicating pregnancy, second trimester: Secondary | ICD-10-CM | POA: Insufficient documentation

## 2022-04-19 DIAGNOSIS — O09213 Supervision of pregnancy with history of pre-term labor, third trimester: Secondary | ICD-10-CM

## 2022-04-19 DIAGNOSIS — O10012 Pre-existing essential hypertension complicating pregnancy, second trimester: Secondary | ICD-10-CM | POA: Diagnosis not present

## 2022-04-19 DIAGNOSIS — O98513 Other viral diseases complicating pregnancy, third trimester: Secondary | ICD-10-CM

## 2022-04-19 DIAGNOSIS — O0990 Supervision of high risk pregnancy, unspecified, unspecified trimester: Secondary | ICD-10-CM

## 2022-04-19 DIAGNOSIS — Z3A28 28 weeks gestation of pregnancy: Secondary | ICD-10-CM

## 2022-04-19 DIAGNOSIS — B009 Herpesviral infection, unspecified: Secondary | ICD-10-CM

## 2022-05-04 ENCOUNTER — Ambulatory Visit (INDEPENDENT_AMBULATORY_CARE_PROVIDER_SITE_OTHER): Payer: Medicaid Other | Admitting: Student

## 2022-05-04 ENCOUNTER — Other Ambulatory Visit: Payer: Self-pay

## 2022-05-04 VITALS — BP 139/90 | HR 83 | Wt 333.4 lb

## 2022-05-04 DIAGNOSIS — R7303 Prediabetes: Secondary | ICD-10-CM

## 2022-05-04 DIAGNOSIS — O0990 Supervision of high risk pregnancy, unspecified, unspecified trimester: Secondary | ICD-10-CM

## 2022-05-04 DIAGNOSIS — O34219 Maternal care for unspecified type scar from previous cesarean delivery: Secondary | ICD-10-CM

## 2022-05-04 DIAGNOSIS — O09293 Supervision of pregnancy with other poor reproductive or obstetric history, third trimester: Secondary | ICD-10-CM

## 2022-05-04 DIAGNOSIS — I10 Essential (primary) hypertension: Secondary | ICD-10-CM

## 2022-05-04 DIAGNOSIS — O09299 Supervision of pregnancy with other poor reproductive or obstetric history, unspecified trimester: Secondary | ICD-10-CM

## 2022-05-04 DIAGNOSIS — O10913 Unspecified pre-existing hypertension complicating pregnancy, third trimester: Secondary | ICD-10-CM

## 2022-05-04 DIAGNOSIS — Z8619 Personal history of other infectious and parasitic diseases: Secondary | ICD-10-CM

## 2022-05-04 DIAGNOSIS — O10919 Unspecified pre-existing hypertension complicating pregnancy, unspecified trimester: Secondary | ICD-10-CM

## 2022-05-04 DIAGNOSIS — Z3A30 30 weeks gestation of pregnancy: Secondary | ICD-10-CM

## 2022-05-04 DIAGNOSIS — O0993 Supervision of high risk pregnancy, unspecified, third trimester: Secondary | ICD-10-CM

## 2022-05-04 DIAGNOSIS — Z9884 Bariatric surgery status: Secondary | ICD-10-CM

## 2022-05-05 ENCOUNTER — Telehealth: Payer: Self-pay | Admitting: Pharmacy Technician

## 2022-05-05 ENCOUNTER — Telehealth: Payer: Self-pay

## 2022-05-05 ENCOUNTER — Other Ambulatory Visit: Payer: Self-pay | Admitting: Student

## 2022-05-05 DIAGNOSIS — O99013 Anemia complicating pregnancy, third trimester: Secondary | ICD-10-CM | POA: Insufficient documentation

## 2022-05-05 DIAGNOSIS — R7303 Prediabetes: Secondary | ICD-10-CM

## 2022-05-05 DIAGNOSIS — O0990 Supervision of high risk pregnancy, unspecified, unspecified trimester: Secondary | ICD-10-CM

## 2022-05-05 LAB — COMPREHENSIVE METABOLIC PANEL
ALT: 11 IU/L (ref 0–32)
AST: 18 IU/L (ref 0–40)
Albumin/Globulin Ratio: 1.1 — ABNORMAL LOW (ref 1.2–2.2)
Albumin: 3.5 g/dL — ABNORMAL LOW (ref 3.9–4.9)
Alkaline Phosphatase: 80 IU/L (ref 44–121)
BUN/Creatinine Ratio: 14 (ref 9–23)
BUN: 9 mg/dL (ref 6–20)
Bilirubin Total: <0.2 mg/dL (ref 0.0–1.2)
CO2: 23 mmol/L (ref 20–29)
Calcium: 8.7 mg/dL (ref 8.7–10.2)
Chloride: 106 mmol/L (ref 96–106)
Creatinine, Ser: 0.63 mg/dL (ref 0.57–1.00)
Globulin, Total: 3.3 g/dL (ref 1.5–4.5)
Glucose: 74 mg/dL (ref 70–99)
Potassium: 4 mmol/L (ref 3.5–5.2)
Sodium: 141 mmol/L (ref 134–144)
Total Protein: 6.8 g/dL (ref 6.0–8.5)
eGFR: 120 mL/min/{1.73_m2} (ref >59)

## 2022-05-05 LAB — CBC
Hematocrit: 31.5 % — ABNORMAL LOW (ref 34.0–46.6)
Hemoglobin: 10.1 g/dL — ABNORMAL LOW (ref 11.1–15.9)
MCH: 28.9 pg (ref 26.6–33.0)
MCHC: 32.1 g/dL (ref 31.5–35.7)
MCV: 90 fL (ref 79–97)
Platelets: 228 10*3/uL (ref 150–450)
RBC: 3.5 x10E6/uL — ABNORMAL LOW (ref 3.77–5.28)
RDW: 14.2 % (ref 11.7–15.4)
WBC: 5.5 10*3/uL (ref 3.4–10.8)

## 2022-05-05 LAB — RPR: RPR Ser Ql: NONREACTIVE

## 2022-05-05 LAB — HIV ANTIBODY (ROUTINE TESTING W REFLEX): HIV Screen 4th Generation wRfx: NONREACTIVE

## 2022-05-05 MED ORDER — GLUCOSE BLOOD VI STRP: ORAL_STRIP | 12 refills | Status: DC

## 2022-05-05 MED ORDER — ACCU-CHEK SOFTCLIX LANCETS MISC: 12 refills | Status: DC

## 2022-05-05 MED ORDER — ACCU-CHEK GUIDE W/DEVICE KIT: 1.0000 | PACK | Freq: Four times a day (QID) | 0 refills | Status: DC

## 2022-05-05 NOTE — Telephone Encounter (Signed)
Auth Submission: NO AUTH NEEDED Payer:  MEDICAID - [employer] Medication & CPT/J Code(s) submitted: Venofer (Iron Sucrose) J1756 Route of submission (phone, fax, portal):  Phone # Fax # Auth type: Buy/Bill Units/visits requested: X2 DOSES Reference number:  Approval from: 05/05/22 to 06/18/22

## 2022-05-05 NOTE — Telephone Encounter (Signed)
-----   Message from Corlis Hove, NP sent at 05/05/2022  6:39 AM EST ----- Regarding: Blood Sugar Monitoring Hello,   Would it be possible to set this patient up for home blood sugar testing for at least 1-week? She cannot tolerate the standard GTT or high sugar intake due to her gastric bypass. It would be lovely to get her supplies and a log for fasting and postprandial measures. If not, we can discuss an alternative plan with the patient at her next visit.   Thank you!

## 2022-05-05 NOTE — Progress Notes (Signed)
   PRENATAL VISIT NOTE  Subjective:  Krystal Hartman is a 31 y.o. [id number] at [redacted]w[redacted]d being seen today for ongoing prenatal care.  She is currently monitored for the following issues for this high-risk pregnancy and has Morbid obesity (HCC); Hidradenitis suppurativa; HSV-2 (herpes simplex virus 2) infection; Previous cesarean section complicating pregnancy, antepartum condition or complication; ASCUS of cervix with negative high risk HPV; Essential hypertension; History of blood transfusion; History of herpes genitalis; Supervision of high risk pregnancy, antepartum; Gastroesophageal reflux disease without esophagitis; S/P laparoscopic sleeve gastrectomy; and Cystic fibrosis carrier on their problem list.  Patient reports fatigue.  Contractions: Not present. Vag. Bleeding: None.  Movement: Present. Denies leaking of fluid.   The following portions of the patient's history were reviewed and updated as appropriate: allergies, current medications, past family history, past medical history, past social history, past surgical history and problem list.   Objective:   Vitals:   05/04/22 1618  BP: (!) 139/90  Pulse: 83  Weight: (!) 333 lb 6.4 oz (151.2 kg)    Fetal Status: Fetal Heart Rate (bpm): 164   Movement: Present     General:  Alert, oriented and cooperative. Patient is in no acute distress.  Skin: Skin is warm and dry. No rash noted.   Cardiovascular: Normal heart rate noted  Respiratory: Normal respiratory effort, no problems with respiration noted  Abdomen: Soft, gravid, appropriate for gestational age.  Pain/Pressure: Absent     Pelvic: Cervical exam deferred        Extremities: Normal range of motion.     Mental Status: Normal mood and affect. Normal behavior. Normal judgment and thought content.   Assessment and Plan:  Pregnancy: V5I4332 at [redacted]w[redacted]d 1. Supervision of high risk pregnancy, antepartum - Vigorous and frequent fetal movement - CBC - HIV antibody (with reflex) - RPR -  Comprehensive metabolic panel - Protein / creatinine ratio, urine  2. [redacted] weeks gestation of pregnancy - Has not completed GTT d/t patient preference. Recommend at home testing 4x/day.  3. Previous cesarean section complicating pregnancy, antepartum condition or complication - repeat is scheduled   4. Essential hypertension 5. HTN in pregnancy, chronic 6. History of pre-eclampsia in prior pregnancy, currently pregnant - CBC - Comprehensive metabolic panel - Protein / creatinine ratio, urine - Comprehensive metabolic panel  7. History of herpes genitalis - valtrex prophy at 35-36 weeks  8. S/P laparoscopic sleeve gastrectomy - Declines glucola. Discussed options for at home testing.   Preterm labor symptoms and general obstetric precautions including but not limited to vaginal bleeding, contractions, leaking of fluid and fetal movement were reviewed in detail with the patient. Please refer to After Visit Summary for other counseling recommendations.   Return in about 4 weeks (around 06/01/2022) for [facility], IN-PERSON; MD ONLY .  Future Appointments  Date Time Provider Department Center  05/17/2022  9:15 AM [facility] NURSE [facility]  05/17/2022  9:30 AM [facility] US3 [facility]  05/23/2022  4:15 PM Autry-Lott, Edwyna Shell [facility]  06/06/2022  3:35 PM Kathlene Cote [facility]  06/16/2022  9:35 AM Milas Hock, MD [facility]    Corlis Hove, NP

## 2022-05-05 NOTE — Telephone Encounter (Signed)
RN called pt to discuss N. Vonita Moss NP request for pt to check BG at home for 1 week. Pt educated on when/how frequently to check BG. Diabetic supplies sent to pt pharmacy. Pt verbalized understanding and denied further questions. Pt next appointment is 12/05. Pt aware.

## 2022-05-06 LAB — PROTEIN / CREATININE RATIO, URINE
Creatinine, Urine: 377.3 mg/dL
Protein, Ur: 56.4 mg/dL
Protein/Creat Ratio: 149 mg/g creat (ref 0–200)

## 2022-05-17 ENCOUNTER — Ambulatory Visit: Payer: Medicaid Other | Admitting: *Deleted

## 2022-05-17 ENCOUNTER — Ambulatory Visit: Payer: Medicaid Other | Attending: Maternal & Fetal Medicine

## 2022-05-17 VITALS — BP 131/79 | HR 85

## 2022-05-17 DIAGNOSIS — O99213 Obesity complicating pregnancy, third trimester: Secondary | ICD-10-CM | POA: Diagnosis not present

## 2022-05-17 DIAGNOSIS — O10013 Pre-existing essential hypertension complicating pregnancy, third trimester: Secondary | ICD-10-CM

## 2022-05-17 DIAGNOSIS — B009 Herpesviral infection, unspecified: Secondary | ICD-10-CM

## 2022-05-17 DIAGNOSIS — O0943 Supervision of pregnancy with grand multiparity, third trimester: Secondary | ICD-10-CM

## 2022-05-17 DIAGNOSIS — O99013 Anemia complicating pregnancy, third trimester: Secondary | ICD-10-CM

## 2022-05-17 DIAGNOSIS — O10912 Unspecified pre-existing hypertension complicating pregnancy, second trimester: Secondary | ICD-10-CM | POA: Diagnosis not present

## 2022-05-17 DIAGNOSIS — O34219 Maternal care for unspecified type scar from previous cesarean delivery: Secondary | ICD-10-CM

## 2022-05-17 DIAGNOSIS — E669 Obesity, unspecified: Secondary | ICD-10-CM

## 2022-05-17 DIAGNOSIS — Z362 Encounter for other antenatal screening follow-up: Secondary | ICD-10-CM

## 2022-05-17 DIAGNOSIS — O0990 Supervision of high risk pregnancy, unspecified, unspecified trimester: Secondary | ICD-10-CM | POA: Diagnosis present

## 2022-05-17 DIAGNOSIS — O98513 Other viral diseases complicating pregnancy, third trimester: Secondary | ICD-10-CM

## 2022-05-17 DIAGNOSIS — O99843 Bariatric surgery status complicating pregnancy, third trimester: Secondary | ICD-10-CM | POA: Diagnosis not present

## 2022-05-17 DIAGNOSIS — O99891 Other specified diseases and conditions complicating pregnancy: Secondary | ICD-10-CM

## 2022-05-17 DIAGNOSIS — O09293 Supervision of pregnancy with other poor reproductive or obstetric history, third trimester: Secondary | ICD-10-CM

## 2022-05-17 DIAGNOSIS — O09213 Supervision of pregnancy with history of pre-term labor, third trimester: Secondary | ICD-10-CM

## 2022-05-17 DIAGNOSIS — Z3A32 32 weeks gestation of pregnancy: Secondary | ICD-10-CM

## 2022-05-17 DIAGNOSIS — O99212 Obesity complicating pregnancy, second trimester: Secondary | ICD-10-CM | POA: Insufficient documentation

## 2022-05-18 ENCOUNTER — Ambulatory Visit (INDEPENDENT_AMBULATORY_CARE_PROVIDER_SITE_OTHER): Payer: Medicaid Other

## 2022-05-18 VITALS — BP 142/88 | HR 94 | Temp 98.4°F | Resp 18 | Ht 64.0 in | Wt 333.2 lb

## 2022-05-18 DIAGNOSIS — D508 Other iron deficiency anemias: Secondary | ICD-10-CM | POA: Diagnosis not present

## 2022-05-18 DIAGNOSIS — O0990 Supervision of high risk pregnancy, unspecified, unspecified trimester: Secondary | ICD-10-CM

## 2022-05-18 DIAGNOSIS — O99013 Anemia complicating pregnancy, third trimester: Secondary | ICD-10-CM

## 2022-05-18 DIAGNOSIS — Z3A32 32 weeks gestation of pregnancy: Secondary | ICD-10-CM

## 2022-05-18 MED ORDER — SODIUM CHLORIDE 0.9 % IV SOLN
500.0000 mg | Freq: Once | INTRAVENOUS | Status: AC
  Administered 2022-05-18: 500 mg via INTRAVENOUS
  Filled 2022-05-18: qty 25

## 2022-05-18 MED ORDER — ACETAMINOPHEN 325 MG PO TABS
650.0000 mg | ORAL_TABLET | Freq: Once | ORAL | Status: AC
  Administered 2022-05-18: 650 mg via ORAL
  Filled 2022-05-18: qty 2

## 2022-05-18 MED ORDER — DIPHENHYDRAMINE HCL 25 MG PO CAPS
25.0000 mg | ORAL_CAPSULE | Freq: Once | ORAL | Status: AC
  Administered 2022-05-18: 25 mg via ORAL
  Filled 2022-05-18: qty 1

## 2022-05-18 NOTE — Progress Notes (Signed)
Diagnosis: Iron Deficiency Anemia  Provider:  Chilton Greathouse MD  Procedure: Infusion  IV Type: Peripheral, IV Location: L Antecubital  Venofer (Iron Sucrose), Dose: 500 mg  Infusion Start Time: 0926  Infusion Stop Time: 1340  Post Infusion IV Care: Observation period completed and Peripheral IV Discontinued  Discharge: Condition: Good, Destination: Home . AVS provided to patient.   Performed by:  Loney Hering, LPN

## 2022-05-22 ENCOUNTER — Other Ambulatory Visit: Payer: Self-pay | Admitting: *Deleted

## 2022-05-22 DIAGNOSIS — O09299 Supervision of pregnancy with other poor reproductive or obstetric history, unspecified trimester: Secondary | ICD-10-CM

## 2022-05-22 DIAGNOSIS — Z903 Acquired absence of stomach [part of]: Secondary | ICD-10-CM

## 2022-05-22 DIAGNOSIS — O0943 Supervision of pregnancy with grand multiparity, third trimester: Secondary | ICD-10-CM

## 2022-05-22 DIAGNOSIS — O34219 Maternal care for unspecified type scar from previous cesarean delivery: Secondary | ICD-10-CM

## 2022-05-22 DIAGNOSIS — O99213 Obesity complicating pregnancy, third trimester: Secondary | ICD-10-CM

## 2022-05-22 DIAGNOSIS — O09899 Supervision of other high risk pregnancies, unspecified trimester: Secondary | ICD-10-CM

## 2022-05-22 DIAGNOSIS — O10913 Unspecified pre-existing hypertension complicating pregnancy, third trimester: Secondary | ICD-10-CM

## 2022-05-23 ENCOUNTER — Ambulatory Visit (INDEPENDENT_AMBULATORY_CARE_PROVIDER_SITE_OTHER): Payer: Medicaid Other | Admitting: Family Medicine

## 2022-05-23 ENCOUNTER — Other Ambulatory Visit: Payer: Self-pay

## 2022-05-23 VITALS — BP 133/75 | HR 79 | Wt 329.9 lb

## 2022-05-23 DIAGNOSIS — O0990 Supervision of high risk pregnancy, unspecified, unspecified trimester: Secondary | ICD-10-CM

## 2022-05-23 DIAGNOSIS — O0993 Supervision of high risk pregnancy, unspecified, third trimester: Secondary | ICD-10-CM

## 2022-05-23 DIAGNOSIS — Z3A33 33 weeks gestation of pregnancy: Secondary | ICD-10-CM

## 2022-05-23 DIAGNOSIS — Z141 Cystic fibrosis carrier: Secondary | ICD-10-CM

## 2022-05-23 DIAGNOSIS — O99013 Anemia complicating pregnancy, third trimester: Secondary | ICD-10-CM

## 2022-05-23 DIAGNOSIS — O34219 Maternal care for unspecified type scar from previous cesarean delivery: Secondary | ICD-10-CM

## 2022-05-23 DIAGNOSIS — O10919 Unspecified pre-existing hypertension complicating pregnancy, unspecified trimester: Secondary | ICD-10-CM

## 2022-05-23 DIAGNOSIS — O10913 Unspecified pre-existing hypertension complicating pregnancy, third trimester: Secondary | ICD-10-CM

## 2022-05-23 DIAGNOSIS — B009 Herpesviral infection, unspecified: Secondary | ICD-10-CM

## 2022-05-23 NOTE — Progress Notes (Addendum)
   PRENATAL VISIT NOTE  Subjective:  Krystal Hartman is a 31 y.o. G3O7564 at [redacted]w[redacted]d being seen today for ongoing prenatal care.  She is currently monitored for the following issues for this low-risk pregnancy and has Morbid obesity (HCC); Hidradenitis suppurativa; HSV-2 (herpes simplex virus 2) infection; Previous cesarean section complicating pregnancy, antepartum condition or complication; ASCUS of cervix with negative high risk HPV; Essential hypertension; History of blood transfusion; History of herpes genitalis; Supervision of high risk pregnancy, antepartum; Gastroesophageal reflux disease without esophagitis; S/P laparoscopic sleeve gastrectomy; Cystic fibrosis carrier; and Anemia during pregnancy in third trimester on their problem list.  Patient reports no complaints.  Contractions: Not present. Vag. Bleeding: None.  Movement: Present. Denies leaking of fluid.   The following portions of the patient's history were reviewed and updated as appropriate: allergies, current medications, past family history, past medical history, past social history, past surgical history and problem list.   Objective:   Vitals:   05/23/22 1703  BP: 133/75  Pulse: 79  Weight: (!) 329 lb 14.4 oz (149.6 kg)    Fetal Status:     Movement: Present     General:  Alert, oriented and cooperative. Patient is in no acute distress.  Skin: Skin is warm and dry. No rash noted.   Cardiovascular: Normal heart rate noted  Respiratory: Normal respiratory effort, no problems with respiration noted  Abdomen: Soft, gravid, appropriate for gestational age.  Pain/Pressure: Absent     Pelvic: Cervical exam deferred        Extremities: Normal range of motion.  Edema: None  Mental Status: Normal mood and affect. Normal behavior. Normal judgment and thought content.   Assessment and Plan:  Pregnancy: [id number] at [redacted]w[redacted]d 1. Supervision of high risk pregnancy, antepartum No concern at this time. Refused gtt, logs review via  mychart and are within goal.   2. Previous cesarean section complicating pregnancy, antepartum condition or complication Scheduled c-section for 1/15. Prefers Dr. Macon Large but understand provider is dependent on the day.   3. Morbid obesity (HCC) Scheduled for growth [place] 12/28. [place] from 11/29 EFW 22%, 1817g  4. HSV-2 (herpes simplex virus 2) infection Discussed starting valtrex at 36 weeks.   5. Cystic fibrosis carrier   6. Anemia during pregnancy in third trimester Hgb 10.1 on 11/16  7. Chronic hypertension affecting pregnancy BP wnl today. No meds. Scheduled for weekly BPP. SIPE precautions given.    Preterm labor symptoms and general obstetric precautions including but not limited to vaginal bleeding, contractions, leaking of fluid and fetal movement were reviewed in detail with the patient. Please refer to After Visit Summary for other counseling recommendations.   No follow-ups on file.  Future Appointments  Date Time Provider Department Center  05/31/2022  8:45 AM CHINF-CHAIR 3 CH-INFWM None  06/01/2022 10:30 AM WMC-M[facility] NURSE [facility]  06/01/2022 10:45 AM WMC-MFC US7 [facility]  06/06/2022  3:35 PM Kathlene Cote [facility]  06/07/2022  3:30 PM WMC-MFC NURSE [facility]  06/07/2022  3:45 PM WMC-MFC US4 W[facility]-MFCUS [facility]  06/15/2022  3:15 PM WMC-MFC NURSE [facility]  06/15/2022  3:30 PM WMC-MFC US3 WMC-MFCUS [facility]  06/16/2022  9:35 AM Milas Hock, MD [facility]    Thadd Apuzzo Autry-Lott, DO

## 2022-05-31 ENCOUNTER — Ambulatory Visit: Payer: Medicaid Other

## 2022-06-01 ENCOUNTER — Ambulatory Visit: Payer: Medicaid Other

## 2022-06-01 ENCOUNTER — Ambulatory Visit: Payer: Medicaid Other | Admitting: *Deleted

## 2022-06-01 VITALS — BP 145/85 | HR 83

## 2022-06-05 ENCOUNTER — Ambulatory Visit (INDEPENDENT_AMBULATORY_CARE_PROVIDER_SITE_OTHER): Payer: Medicaid Other

## 2022-06-05 VITALS — BP 166/85 | HR 89 | Temp 98.2°F | Resp 18 | Ht 64.0 in | Wt 338.0 lb

## 2022-06-05 DIAGNOSIS — Z3A35 35 weeks gestation of pregnancy: Secondary | ICD-10-CM

## 2022-06-05 DIAGNOSIS — O99013 Anemia complicating pregnancy, third trimester: Secondary | ICD-10-CM | POA: Diagnosis not present

## 2022-06-05 DIAGNOSIS — D508 Other iron deficiency anemias: Secondary | ICD-10-CM

## 2022-06-05 DIAGNOSIS — O0990 Supervision of high risk pregnancy, unspecified, unspecified trimester: Secondary | ICD-10-CM

## 2022-06-05 MED ORDER — ACETAMINOPHEN 325 MG PO TABS
650.0000 mg | ORAL_TABLET | Freq: Once | ORAL | Status: AC
  Administered 2022-06-05: 650 mg via ORAL
  Filled 2022-06-05: qty 2

## 2022-06-05 MED ORDER — SODIUM CHLORIDE 0.9 % IV SOLN
500.0000 mg | Freq: Once | INTRAVENOUS | Status: AC
  Administered 2022-06-05: 500 mg via INTRAVENOUS
  Filled 2022-06-05: qty 25

## 2022-06-05 MED ORDER — DIPHENHYDRAMINE HCL 25 MG PO CAPS
25.0000 mg | ORAL_CAPSULE | Freq: Once | ORAL | Status: AC
  Administered 2022-06-05: 25 mg via ORAL
  Filled 2022-06-05: qty 1

## 2022-06-05 NOTE — Progress Notes (Signed)
Diagnosis: Infusion  Provider:  Chilton Greathouse MD  Procedure: Infusion  IV Type: Peripheral, IV Location: L Hand  Venofer (Iron Sucrose), Dose: 500 mg  Infusion Start Time: 1102  Infusion Stop Time: 1506  Post Infusion IV Care: Peripheral IV Discontinued  Discharge: Condition: Good, Destination: Home . AVS provided to patient.   Performed by:  Loney Hering, LPN

## 2022-06-06 ENCOUNTER — Ambulatory Visit (INDEPENDENT_AMBULATORY_CARE_PROVIDER_SITE_OTHER): Payer: Medicaid Other | Admitting: Medical

## 2022-06-06 ENCOUNTER — Other Ambulatory Visit: Payer: Self-pay

## 2022-06-06 ENCOUNTER — Encounter: Payer: Self-pay | Admitting: Medical

## 2022-06-06 VITALS — BP 156/96 | HR 92 | Wt 342.0 lb

## 2022-06-06 DIAGNOSIS — Z9884 Bariatric surgery status: Secondary | ICD-10-CM

## 2022-06-06 DIAGNOSIS — I10 Essential (primary) hypertension: Secondary | ICD-10-CM

## 2022-06-06 DIAGNOSIS — Z141 Cystic fibrosis carrier: Secondary | ICD-10-CM

## 2022-06-06 DIAGNOSIS — K219 Gastro-esophageal reflux disease without esophagitis: Secondary | ICD-10-CM

## 2022-06-06 DIAGNOSIS — O99013 Anemia complicating pregnancy, third trimester: Secondary | ICD-10-CM

## 2022-06-06 DIAGNOSIS — O0990 Supervision of high risk pregnancy, unspecified, unspecified trimester: Secondary | ICD-10-CM

## 2022-06-06 DIAGNOSIS — Z3A35 35 weeks gestation of pregnancy: Secondary | ICD-10-CM

## 2022-06-06 DIAGNOSIS — Z8619 Personal history of other infectious and parasitic diseases: Secondary | ICD-10-CM

## 2022-06-06 DIAGNOSIS — O34219 Maternal care for unspecified type scar from previous cesarean delivery: Secondary | ICD-10-CM

## 2022-06-06 MED ORDER — VALACYCLOVIR HCL 500 MG PO TABS: 500.0000 mg | ORAL_TABLET | Freq: Two times a day (BID) | ORAL | 1 refills | Status: DC

## 2022-06-06 NOTE — Progress Notes (Signed)
   PRENATAL VISIT NOTE  Subjective:  Krystal Hartman is a 31 y.o. W1T0034 at 48w1dbeing seen today for ongoing prenatal care.  She is currently monitored for the following issues for this high-risk pregnancy and has Morbid obesity (HOcheyedan; Hidradenitis suppurativa; HSV-2 (herpes simplex virus 2) infection; Previous cesarean section complicating pregnancy, antepartum condition or complication; ASCUS of cervix with negative high risk HPV; Essential hypertension; History of blood transfusion; History of herpes genitalis; Supervision of high risk pregnancy, antepartum; Gastroesophageal reflux disease without esophagitis; S/P laparoscopic sleeve gastrectomy; Cystic fibrosis carrier; and Anemia during pregnancy in third trimester on their problem list.  Patient reports no complaints.  Contractions: Not present. Vag. Bleeding: None.  Movement: Present. Denies leaking of fluid.   The following portions of the patient's history were reviewed and updated as appropriate: allergies, current medications, past family history, past medical history, past social history, past surgical history and problem list.   Objective:   Vitals:   06/06/22 1553 06/06/22 1558  BP: (!) 149/107 (!) 156/96  Pulse: 91 92  Weight: (!) 342 lb (155.1 kg)     Fetal Status: Fetal Heart Rate (bpm): 141   Movement: Present     General:  Alert, oriented and cooperative. Patient is in no acute distress.  Skin: Skin is warm and dry. No rash noted.   Cardiovascular: Normal heart rate noted  Respiratory: Normal respiratory effort, no problems with respiration noted  Abdomen: Soft, gravid, appropriate for gestational age.  Pain/Pressure: Absent     Pelvic: Cervical exam deferred        Extremities: Normal range of motion.  Edema: None  Mental Status: Normal mood and affect. Normal behavior. Normal judgment and thought content.   Assessment and Plan:  Pregnancy: [id number] 368w1d. Supervision of high risk pregnancy,  antepartum  2. S/P laparoscopic sleeve gastrectomy  3. Previous cesarean section complicating pregnancy, antepartum condition or complication - Repeat scheduled for 07/03/22, does not want female provider, will attempt to reschedule   4. History of herpes genitalis - Rx valtex   5. Morbid obesity (HCAlgon[place]- Growth USKoreaith MFM   6. Gastroesophageal reflux disease without esophagitis  7. Cystic fibrosis carrier  8. Essential hypertension - Elevated today, no HA, blurred vision, edema or epigastric pain  - Repeat BP at MFM visit tomorrow  - CBC - Comp Met (CMET) - Protein / creatinine ratio, urine  9. Anemia during pregnancy in third trimester - Had Venofer x 2  10. [redacted] weeks gestation of pregnancy  Preterm labor symptoms and general obstetric precautions including but not limited to vaginal bleeding, contractions, leaking of fluid and fetal movement were reviewed in detail with the patient. Please refer to After Visit Summary for other counseling recommendations.   Return in about 1 week (around 06/13/2022) [facility], [facility].  Future Appointments  Date Time Provider [place]12/20/2023  3:30 PM [facility]/20/2023  3:45 PM [facility]MFC US[facility]28/2023  3:15 PM [facility]MFC [facility]URSE [facility]/28/2023  3:30 PM [facility]MFC US3 [facility]12/29/2023  9:35 AM DuRadene GunningMD WMW [facility]1/09/2022  1:55 PM ArWoodroe ModeMD WMHospital Indian School RdMBaylor [facility]  JuKerry HoughPA-C

## 2022-06-07 ENCOUNTER — Ambulatory Visit: Payer: Medicaid Other | Admitting: *Deleted

## 2022-06-07 ENCOUNTER — Ambulatory Visit: Payer: Medicaid Other | Attending: Maternal & Fetal Medicine

## 2022-06-07 VITALS — BP 159/87 | HR 92

## 2022-06-07 DIAGNOSIS — O0943 Supervision of pregnancy with grand multiparity, third trimester: Secondary | ICD-10-CM | POA: Diagnosis present

## 2022-06-07 DIAGNOSIS — O09299 Supervision of pregnancy with other poor reproductive or obstetric history, unspecified trimester: Secondary | ICD-10-CM | POA: Insufficient documentation

## 2022-06-07 DIAGNOSIS — B009 Herpesviral infection, unspecified: Secondary | ICD-10-CM

## 2022-06-07 DIAGNOSIS — O99891 Other specified diseases and conditions complicating pregnancy: Secondary | ICD-10-CM

## 2022-06-07 DIAGNOSIS — O0990 Supervision of high risk pregnancy, unspecified, unspecified trimester: Secondary | ICD-10-CM | POA: Diagnosis present

## 2022-06-07 DIAGNOSIS — O99213 Obesity complicating pregnancy, third trimester: Secondary | ICD-10-CM | POA: Diagnosis not present

## 2022-06-07 DIAGNOSIS — O09213 Supervision of pregnancy with history of pre-term labor, third trimester: Secondary | ICD-10-CM

## 2022-06-07 DIAGNOSIS — Z903 Acquired absence of stomach [part of]: Secondary | ICD-10-CM | POA: Insufficient documentation

## 2022-06-07 DIAGNOSIS — O99843 Bariatric surgery status complicating pregnancy, third trimester: Secondary | ICD-10-CM

## 2022-06-07 DIAGNOSIS — O10913 Unspecified pre-existing hypertension complicating pregnancy, third trimester: Secondary | ICD-10-CM | POA: Insufficient documentation

## 2022-06-07 DIAGNOSIS — O99013 Anemia complicating pregnancy, third trimester: Secondary | ICD-10-CM | POA: Diagnosis present

## 2022-06-07 DIAGNOSIS — Z3A35 35 weeks gestation of pregnancy: Secondary | ICD-10-CM

## 2022-06-07 DIAGNOSIS — O34219 Maternal care for unspecified type scar from previous cesarean delivery: Secondary | ICD-10-CM | POA: Insufficient documentation

## 2022-06-07 DIAGNOSIS — Z148 Genetic carrier of other disease: Secondary | ICD-10-CM

## 2022-06-07 DIAGNOSIS — O09899 Supervision of other high risk pregnancies, unspecified trimester: Secondary | ICD-10-CM | POA: Insufficient documentation

## 2022-06-07 DIAGNOSIS — O10013 Pre-existing essential hypertension complicating pregnancy, third trimester: Secondary | ICD-10-CM | POA: Diagnosis not present

## 2022-06-07 DIAGNOSIS — E669 Obesity, unspecified: Secondary | ICD-10-CM | POA: Diagnosis not present

## 2022-06-07 DIAGNOSIS — O09293 Supervision of pregnancy with other poor reproductive or obstetric history, third trimester: Secondary | ICD-10-CM

## 2022-06-07 DIAGNOSIS — O98513 Other viral diseases complicating pregnancy, third trimester: Secondary | ICD-10-CM

## 2022-06-07 LAB — COMPREHENSIVE METABOLIC PANEL
ALT: 14 IU/L (ref 0–32)
AST: 13 IU/L (ref 0–40)
Albumin/Globulin Ratio: 1 — ABNORMAL LOW (ref 1.2–2.2)
Albumin: 3.3 g/dL — ABNORMAL LOW (ref 3.9–4.9)
Alkaline Phosphatase: 81 IU/L (ref 44–121)
BUN/Creatinine Ratio: 19 (ref 9–23)
BUN: 10 mg/dL (ref 6–20)
Bilirubin Total: <0.2 mg/dL (ref 0.0–1.2)
CO2: 21 mmol/L (ref 20–29)
Calcium: 8.7 mg/dL (ref 8.7–10.2)
Chloride: 105 mmol/L (ref 96–106)
Creatinine, Ser: 0.54 mg/dL — ABNORMAL LOW (ref 0.57–1.00)
Globulin, Total: 3.2 g/dL (ref 1.5–4.5)
Glucose: 71 mg/dL (ref 70–99)
Potassium: 3.9 mmol/L (ref 3.5–5.2)
Sodium: 137 mmol/L (ref 134–144)
Total Protein: 6.5 g/dL (ref 6.0–8.5)
eGFR: 125 mL/min/{1.73_m2} (ref >59)

## 2022-06-07 LAB — PROTEIN / CREATININE RATIO, URINE
Creatinine, Urine: 223.8 mg/dL
Protein, Ur: 22.5 mg/dL
Protein/Creat Ratio: 101 mg/g creat (ref 0–200)

## 2022-06-07 LAB — CBC
Hematocrit: 30.5 % — ABNORMAL LOW (ref 34.0–46.6)
Hemoglobin: 10.2 g/dL — ABNORMAL LOW (ref 11.1–15.9)
MCH: 29.9 pg (ref 26.6–33.0)
MCHC: 33.4 g/dL (ref 31.5–35.7)
MCV: 89 fL (ref 79–97)
Platelets: 219 10*3/uL (ref 150–450)
RBC: 3.41 x10E6/uL — ABNORMAL LOW (ref 3.77–5.28)
RDW: 14.1 % (ref 11.7–15.4)
WBC: 6.2 10*3/uL (ref 3.4–10.8)

## 2022-06-14 NOTE — Progress Notes (Addendum)
   PRENATAL VISIT NOTE  Subjective:  Krystal Hartman is a 31 y.o. V6H6073 at [redacted]w[redacted]d being seen today for ongoing prenatal care.  She is currently monitored for the following issues for this high-risk pregnancy and has Morbid obesity (HCC); Hidradenitis suppurativa; HSV-2 (herpes simplex virus 2) infection; Previous cesarean section complicating pregnancy, antepartum condition or complication; ASCUS of cervix with negative high risk HPV; Essential hypertension; History of blood transfusion; History of herpes genitalis; Supervision of high risk pregnancy, antepartum; Gastroesophageal reflux disease without esophagitis; S/P laparoscopic sleeve gastrectomy; Cystic fibrosis carrier; and Anemia during pregnancy in third trimester on their problem list.  Patient reports no complaints.  Contractions: Not present. Vag. Bleeding: None.  Movement: Present. Denies leaking of fluid.   The following portions of the patient's history were reviewed and updated as appropriate: allergies, current medications, past family history, past medical history, past social history, past surgical history and problem list.   Objective:   Vitals:   06/16/22 0937  BP: (!) 132/90  Pulse: (!) 102  Weight: (!) 336 lb (152.4 kg)    Fetal Status: Fetal Heart Rate (bpm): 151   Movement: Present     General:  Alert, oriented and cooperative. Patient is in no acute distress.  Skin: Skin is warm and dry. No rash noted.   Cardiovascular: Normal heart rate noted  Respiratory: Normal respiratory effort, no problems with respiration noted  Abdomen: Soft, gravid, appropriate for gestational age.  Pain/Pressure: Present     Pelvic: Cervical exam deferred        Extremities: Normal range of motion.  Edema: None  Mental Status: Normal mood and affect. Normal behavior. Normal judgment and thought content.   Assessment and Plan:  Pregnancy: [id number] at [redacted]w[redacted]d 1. Essential hypertension BP today is 132/90. CBC and CMP were normal  yesterday and c/w her baseline. P/C was 160.  No meds Growth on 12/28 - not yet reported but historically she has normal growth.   2. Anemia during pregnancy in third trimester Mild anemia continues.   3. Cystic fibrosis carrier Partner testing unknown.   4. HSV-2 (herpes simplex virus 2) infection Started valtrex  5. Previous cesarean section complicating pregnancy, antepartum condition or complication Scheduled for repeat c-section on 1/3.   6. Supervision of high risk pregnancy, antepartum Cultures done today.  Discussed RSV vaccine and recommendation for it and how to obtain.   7. S/P laparoscopic sleeve gastrectomy Could not do GTT due to this. Cannot do ldASA either. Checking CBGs but did not bring log. Reports they are normal.   8. Morbid obesity (HCC)   Preterm labor symptoms and general obstetric precautions including but not limited to vaginal bleeding, contractions, leaking of fluid and fetal movement were reviewed in detail with the patient. Please refer to After Visit Summary for other counseling recommendations.   No follow-ups on file.  Future Appointments  Date Time Provider Department Center  06/20/2022  1:00 PM [facility] NURSE [facility]  06/20/2022  1:15 PM [facility] NST [facility]  06/22/2022  1:55 PM Adam Phenix, MD [facility]    Milas Hock, MD

## 2022-06-15 ENCOUNTER — Ambulatory Visit: Payer: Medicaid Other | Attending: Maternal & Fetal Medicine

## 2022-06-15 ENCOUNTER — Ambulatory Visit: Payer: Medicaid Other | Admitting: *Deleted

## 2022-06-15 ENCOUNTER — Inpatient Hospital Stay (HOSPITAL_BASED_OUTPATIENT_CLINIC_OR_DEPARTMENT_OTHER): Payer: Medicaid Other

## 2022-06-15 ENCOUNTER — Inpatient Hospital Stay (HOSPITAL_COMMUNITY)
Admission: AD | Admit: 2022-06-15 | Discharge: 2022-06-15 | Disposition: A | Discharge status: Home / Self Care | Payer: Medicaid Other | Attending: Obstetrics & Gynecology | Admitting: Obstetrics & Gynecology

## 2022-06-15 ENCOUNTER — Encounter (HOSPITAL_COMMUNITY): Payer: Self-pay | Admitting: Obstetrics & Gynecology

## 2022-06-15 ENCOUNTER — Ambulatory Visit: Payer: Medicaid Other | Attending: Student | Admitting: Maternal & Fetal Medicine

## 2022-06-15 VITALS — BP 161/103 | HR 108

## 2022-06-15 DIAGNOSIS — O99013 Anemia complicating pregnancy, third trimester: Secondary | ICD-10-CM

## 2022-06-15 DIAGNOSIS — O99843 Bariatric surgery status complicating pregnancy, third trimester: Secondary | ICD-10-CM | POA: Diagnosis not present

## 2022-06-15 DIAGNOSIS — O09899 Supervision of other high risk pregnancies, unspecified trimester: Secondary | ICD-10-CM | POA: Diagnosis present

## 2022-06-15 DIAGNOSIS — O98513 Other viral diseases complicating pregnancy, third trimester: Secondary | ICD-10-CM

## 2022-06-15 DIAGNOSIS — I10 Essential (primary) hypertension: Secondary | ICD-10-CM

## 2022-06-15 DIAGNOSIS — O09213 Supervision of pregnancy with history of pre-term labor, third trimester: Secondary | ICD-10-CM

## 2022-06-15 DIAGNOSIS — O10913 Unspecified pre-existing hypertension complicating pregnancy, third trimester: Secondary | ICD-10-CM | POA: Insufficient documentation

## 2022-06-15 DIAGNOSIS — O10013 Pre-existing essential hypertension complicating pregnancy, third trimester: Secondary | ICD-10-CM

## 2022-06-15 DIAGNOSIS — E669 Obesity, unspecified: Secondary | ICD-10-CM

## 2022-06-15 DIAGNOSIS — O09293 Supervision of pregnancy with other poor reproductive or obstetric history, third trimester: Secondary | ICD-10-CM | POA: Diagnosis not present

## 2022-06-15 DIAGNOSIS — Z3689 Encounter for other specified antenatal screening: Secondary | ICD-10-CM | POA: Diagnosis not present

## 2022-06-15 DIAGNOSIS — O09299 Supervision of pregnancy with other poor reproductive or obstetric history, unspecified trimester: Secondary | ICD-10-CM | POA: Insufficient documentation

## 2022-06-15 DIAGNOSIS — O99213 Obesity complicating pregnancy, third trimester: Secondary | ICD-10-CM | POA: Diagnosis not present

## 2022-06-15 DIAGNOSIS — O0943 Supervision of pregnancy with grand multiparity, third trimester: Secondary | ICD-10-CM | POA: Insufficient documentation

## 2022-06-15 DIAGNOSIS — B009 Herpesviral infection, unspecified: Secondary | ICD-10-CM

## 2022-06-15 DIAGNOSIS — Z903 Acquired absence of stomach [part of]: Secondary | ICD-10-CM | POA: Diagnosis present

## 2022-06-15 DIAGNOSIS — Z148 Genetic carrier of other disease: Secondary | ICD-10-CM

## 2022-06-15 DIAGNOSIS — Z3A36 36 weeks gestation of pregnancy: Secondary | ICD-10-CM

## 2022-06-15 DIAGNOSIS — O289 Unspecified abnormal findings on antenatal screening of mother: Secondary | ICD-10-CM

## 2022-06-15 DIAGNOSIS — O99891 Other specified diseases and conditions complicating pregnancy: Secondary | ICD-10-CM

## 2022-06-15 DIAGNOSIS — O0990 Supervision of high risk pregnancy, unspecified, unspecified trimester: Secondary | ICD-10-CM

## 2022-06-15 DIAGNOSIS — O34219 Maternal care for unspecified type scar from previous cesarean delivery: Secondary | ICD-10-CM | POA: Diagnosis not present

## 2022-06-15 DIAGNOSIS — O10919 Unspecified pre-existing hypertension complicating pregnancy, unspecified trimester: Secondary | ICD-10-CM

## 2022-06-15 DIAGNOSIS — Z141 Cystic fibrosis carrier: Secondary | ICD-10-CM

## 2022-06-15 DIAGNOSIS — O285 Abnormal chromosomal and genetic finding on antenatal screening of mother: Secondary | ICD-10-CM

## 2022-06-15 LAB — COMPREHENSIVE METABOLIC PANEL
ALT: 12 U/L (ref 0–44)
AST: 17 U/L (ref 15–41)
Albumin: 2.7 g/dL — ABNORMAL LOW (ref 3.5–5.0)
Alkaline Phosphatase: 75 U/L (ref 38–126)
Anion gap: 9 (ref 5–15)
BUN: 8 mg/dL (ref 6–20)
CO2: 21 mmol/L — ABNORMAL LOW (ref 22–32)
Calcium: 8.8 mg/dL — ABNORMAL LOW (ref 8.9–10.3)
Chloride: 106 mmol/L (ref 98–111)
Creatinine, Ser: 0.55 mg/dL (ref 0.44–1.00)
GFR, Estimated: >60 mL/min (ref >60)
Glucose, Bld: 99 mg/dL (ref 70–99)
Potassium: 3.3 mmol/L — ABNORMAL LOW (ref 3.5–5.1)
Sodium: 136 mmol/L (ref 135–145)
Total Bilirubin: 0.6 mg/dL (ref 0.3–1.2)
Total Protein: 7.3 g/dL (ref 6.5–8.1)

## 2022-06-15 LAB — SAMPLE TO BLOOD BANK

## 2022-06-15 LAB — CBC
HCT: 30.8 % — ABNORMAL LOW (ref 36.0–46.0)
Hemoglobin: 10.5 g/dL — ABNORMAL LOW (ref 12.0–15.0)
MCH: 30.5 pg (ref 26.0–34.0)
MCHC: 34.1 g/dL (ref 30.0–36.0)
MCV: 89.5 fL (ref 80.0–100.0)
Platelets: 189 10*3/uL (ref 150–400)
RBC: 3.44 MIL/uL — ABNORMAL LOW (ref 3.87–5.11)
RDW: 14.4 % (ref 11.5–15.5)
WBC: 5 10*3/uL (ref 4.0–10.5)
nRBC: 0 % (ref 0.0–0.2)

## 2022-06-15 LAB — PROTEIN / CREATININE RATIO, URINE
Creatinine, Urine: 62 mg/dL
Protein Creatinine Ratio: 0.16 mg/mg{Cre} — ABNORMAL HIGH (ref 0.00–0.15)
Total Protein, Urine: 10 mg/dL

## 2022-06-15 NOTE — MAU Provider Note (Signed)
Chief Complaint:  fetal monitoring   Event Date/Time   First Provider Initiated Contact with Patient 06/15/22 2119     HPI: Krystal Hartman is a 31 y.o. M5Y6503 at 43w3dho presents to maternity admissions reporting elevated blood pressure at the MFM office today with a BPP score of 6/8.  Was sent here by Dr BGertie Exonfor eval and repeat BPP. .Marland KitchenShe reports good fetal movement, denies LOF, vaginal bleeding, vaginal itching/burning, urinary symptoms, h/a, dizziness, n/v, diarrhea, constipation or fever/chills.  She denies headache, visual changes or RUQ abdominal pain.  Hypertension This is a recurrent problem. The current episode started today. The problem has been gradually worsening since onset. Pertinent negatives include no anxiety, blurred vision, chest pain, headaches or peripheral edema. There are no associated agents to hypertension. Risk factors for coronary artery disease include obesity. Past treatments include nothing. There are no compliance problems.    RN Note: Krystal HAYASHIDAis a 31y.o. at 357w3dere in MAU reporting being sent over from MFM today due to nonreactive NST and elevated B/P. Denies pain, VB or LOF. Reports good FM   Past Medical History: Past Medical History:  Diagnosis Date   Anemia    Boil    on side   Cesarean section wound complication 1/10/21/6566 Consider wound vac    Hx of preeclampsia, prior pregnancy, currently pregnant 11/21/2018   Hypertension    started on BP med yesterday   MRSA (methicillin resistant Staphylococcus aureus) colonization    chronic intermittent problem with boils-occassionally test positive for MRSA. None present at PAT appt.   PONV (postoperative nausea and vomiting) 2013   pt. states she had a lot of nausea and vomiting after her last c[facility]s  [facility]    Previous cesarean delivery, antepartum condition or complication 3/06/21/7515 For tertiary C/S, no tubal    Severe preeclampsia  superimposed on chronic hypertension, delivered 06/10/2011   _0  Aspirin 81 mg daily after 12 weeks Current antihypertensives:  None   Baseline and surveillance labs (pulled in from [facility] as needed)  Lab Results Component Value Date  PLT 306 12/02/2018  CREATININE 0.63 12/02/2018  AST 14 12/02/2018  ALT 11 12/02/2018  PROTEIN24HR 156 (H) 05/27/2011   Antenatal Testing CHTN - O10.919  Group I   BP < 140/90, no meds, no preeclampsia, AGA, nml AF    Past obstetric history: OB History  Gravida Para Term Preterm AB Living  _1 SAB IAB Ectopic Multiple Live Births  1 0 0 1 5    # Outcome Date GA Lbr Len/2nd Weight Sex Delivery Anes PTL Lv  6 Current           5A Preterm 05/06/19 3416w4d280 g M CS-LTranv Spinal, EPI  LIV     Birth Comments: Late preterm female with respiratory distress and hypospadius  5B Preterm 05/06/19 34w66w4d20 g M CS-LTranv Spinal, EPI  LIV  4 Term 12/11/12 39w020w0d5 g M CS-LTranv Spinal, EPI  LIV     Birth Comments: repeat c/s  3 Term 2013 56w2d60w2dS-LTranv   LIV     Birth Comments: repeat c/s, Hospitl[facility]ized for Polyhydraminous , preeclampsia for 2 months  2 SAB 2012 [redacted]w[redacted]d  720w0d  1 Term 11/10/09 [redacted]w[redacted]d  [redacted]w[redacted]d M CS-LTranv   LIV     Birth  Comments: facial presentation, c/s due to ftp, IOL fo preeclampsia.     Past Surgical History: Past Surgical History:  Procedure Laterality Date   CESAREAN SECTION     CESAREAN SECTION  06/22/2011   Procedure: CESAREAN SECTION;  Surgeon: Eldred Manges, MD;  Location: [facility];  Service: Gynecology;  Laterality: N/A;  MRSA positive, Latex Allergy   CESAREAN SECTION N/A 12/11/2012   Procedure: REPEAT CESAREAN SECTION;  Surgeon: Woodroe Mode, MD;  Location: [facility];  Service: Obstetrics;  Laterality: N/A;   CESAREAN SECTION MULTI-GESTATIONAL N/A 05/06/2019   Procedure: CESAREAN SECTION MULTI-GESTATIONAL;  Surgeon: Osborne Oman, MD;  Location: [facility];  Service: Obstetrics;  Laterality: N/A;   EYE  SURGERY     IUD REMOVAL N/A 08/29/2017   Procedure: HYSTEROSCOPY WITH INTRAUTERINE DEVICE (IUD) REMOVAL;  Surgeon: Osborne Oman, MD;  Location: [facility];  Service: Gynecology;  Laterality: N/A;   LAPAROSCOPIC GASTRIC SLEEVE RESECTION  04/2021    Family History: Family History  Problem Relation Age of Onset   Asthma Mother    Hypertension Mother    Diabetes Mother    Diabetes Father    Heart disease Father    Colon cancer Father    Diabetes Maternal Aunt    Cancer Maternal Grandmother    Stroke Neg Hx     Social History: Social History   Tobacco Use   Smoking status: Never   Smokeless tobacco: Never  Vaping Use   Vaping Use: Never used  Substance Use Topics   Alcohol use: No   Drug use: No    Allergies:  Allergies  Allergen Reactions   Latex Hives and Itching   Pork-Derived Products Other (See Comments)    Does not eat due to religious reasons.    Meds:  Medications Prior to Admission  Medication Sig Dispense Refill Last Dose   ergocalciferol (VITAMIN D2) 1.25 MG (50000 UT) capsule Take by mouth.   06/15/2022   Multiple Vitamin (MULTIVITAMIN) tablet Take 1 tablet by mouth daily.   06/15/2022   valACYclovir (VALTREX) 500 MG tablet Take 1 tablet (500 mg total) by mouth 2 (two) times daily. 60 tablet 1 06/15/2022   Accu-Chek Softclix Lancets lancets Use as instructed 100 each 12    Blood Glucose Monitoring Suppl (ACCU-CHEK GUIDE) w/Device KIT 1 Device by Does not apply route in the morning, at noon, in the evening, and at bedtime. 1 kit 0    Blood Pressure Monitoring DEVI 1 each by Does not apply route once a week. 1 each 0    calcium carbonate (OS-CAL) 1250 (500 Ca) MG chewable tablet Chew 1 tablet by mouth daily.      glucose blood test strip Use as instructed 100 each 12     I have reviewed patient's Past Medical Hx, Surgical Hx, Family Hx, Social Hx, medications and allergies.   ROS:  Review of Systems  Eyes:  Negative for blurred vision.  Cardiovascular:   Negative for chest pain.  Neurological:  Negative for headaches.   Other systems negative  Physical Exam  Patient Vitals for the past 24 hrs:  BP Temp Pulse Resp SpO2 Height Weight  06/15/22 2054 (!) 146/98 98.4 F (36.9 C) 92 18 100 % _0  (1.626 m) (!) 153.8 kg   Vitals:   06/15/22 2146 06/15/22 2150 06/15/22 2200 06/15/22 2201  BP: (!) 150/93   (!) 158/95  Pulse: 98   (!) 105  Resp:      Temp:  SpO2:  99% 98%   Weight:      Height:       Vitals:   06/15/22 2201 06/15/22 2215 06/15/22 2302 06/15/22 2324  BP: (!) 158/95  (!) 145/92 (!) 142/98  Pulse: (!) 105  (!) 101 98  Resp:    18  Temp:    98.4 F (36.9 C)  TempSrc:    Oral  SpO2:  97%  100%  Weight:      Height:         Constitutional: Well-developed, well-nourished female in no acute distress.  Cardiovascular: normal rate and rhythm Respiratory: normal effort GI: Abd soft, non-tender, gravid appropriate for gestational age.   No rebound or guarding. MS: Extremities nontender, Trace pedal edema, normal ROM Neurologic: Alert and oriented x 4.   DTRs 3+ with no clonus GU: Neg CVAT.   FHT:  Baseline 140 , moderate variability, accelerations present, no decelerations Contractions: occasional    Labs: Results for orders placed or performed during the hospital encounter of 06/15/22 (from the past 24 hour(s))  Protein / creatinine ratio, urine     Status: Abnormal   Collection Time: 06/15/22  9:25 PM  Result Value Ref Range   Creatinine, Urine 62 mg/dL   Total Protein, Urine 10 mg/dL   Protein Creatinine Ratio 0.16 (H) 0.00 - 0.15 mg/mg[Cre]  Sample to Blood Bank     Status: None   Collection Time: 06/15/22  9:35 PM  Result Value Ref Range   Blood Bank Specimen SAMPLE AVAILABLE FOR TESTING    Sample Expiration      06/16/2022,2359 Performed at [facility], [address] [place], [place] [address]   CBC     Status: Abnormal   Collection Time: 06/15/22  9:39 PM  Result Value Ref Range   WBC  5.0 4.0 - 10.5 K/uL   RBC 3.44 (L) 3.87 - 5.11 MIL/uL   Hemoglobin 10.5 (L) 12.0 - 15.0 g/dL   HCT 30.8 (L) 36.0 - 46.0 %   MCV 89.5 80.0 - 100.0 fL   MCH 30.5 26.0 - 34.0 pg   MCHC 34.1 30.0 - 36.0 g/dL   RDW 14.4 11.5 - 15.5 %   Platelets 189 150 - 400 K/uL   nRBC 0.0 0.0 - 0.2 %  Comprehensive metabolic panel     Status: Abnormal   Collection Time: 06/15/22  9:39 PM  Result Value Ref Range   Sodium 136 135 - 145 mmol/L   Potassium 3.3 (L) 3.5 - 5.1 mmol/L   Chloride 106 98 - 111 mmol/L   CO2 21 (L) 22 - 32 mmol/L   Glucose, Bld 99 70 - 99 mg/dL   BUN 8 6 - 20 mg/dL   Creatinine, Ser 0.55 0.44 - 1.00 mg/dL   Calcium 8.8 (L) 8.9 - 10.3 mg/dL   Total Protein 7.3 6.5 - 8.1 g/dL   Albumin 2.7 (L) 3.5 - 5.0 g/dL   AST 17 15 - 41 U/L   ALT 12 0 - 44 U/L   Alkaline Phosphatase 75 38 - 126 U/L   Total Bilirubin 0.6 0.3 - 1.2 mg/dL   GFR, Estimated >60 >60 mL/min   Anion gap 9 5 - 15    A/Positive/-- (07/19 1506)  Imaging:  BPP 8/8 (10/10 with reactive NST)  MAU Course/MDM: I have reviewed the triage vital signs and the nursing notes.   Pertinent labs & imaging results that were available during my care of the patient were reviewed  by me and considered in my medical decision making (see chart for details).      I have reviewed her medical records including past results, notes and treatments.   I have ordered labs and reviewed results. These are normal  NST reviewed, reactive Consult Dr Nelda Marseille with presentation, exam findings and test results.  Treatments in MAU included BPP, NST.    Assessment: Single IUP at 38w4dChronic Hypertension in pregnancy Exacerbation of Hypertension without signs of preeclampsia  Plan: Discharge home Preeclampsia precautions Labor precautions and fetal kick counts Follow up in Office for prenatal visits  Dr ONelda Marseillewill arrange C/S to be done at 37 weeks Encouraged to return if she develops worsening of symptoms, increase in pain, fever, or  other concerning symptoms.   Pt stable at time of discharge.  MHansel FeinsteinCNM, MSN Certified Nurse-Midwife 06/15/2022 9:20 PM

## 2022-06-15 NOTE — MAU Note (Signed)
.  Krystal Hartman is a 31 y.o. at [redacted]w[redacted]d here in MAU reporting being sent over from MFM today due to nonreactive NST and elevated B/P. Denies pain, VB or LOF. Reports good FM  Onset of complaint: today Pain score: 0 Vitals:   06/15/22 2054  BP: (!) 146/98  Pulse: 92  Resp: 18  Temp: 98.4 F (36.9 C)  SpO2: 100%     FHT:148 Lab orders placed from triage:  none

## 2022-06-15 NOTE — Progress Notes (Signed)
MFM Brief Noted   Krystal Hartman is here for follow up growth due to elevated BMI  She is seen at the request of Krystal Hock, MD.  Normal interval growth with measurements consistent with dates Good fetal movement and amniotic fluid volume BPP 6/8  Krystal Hartman also had BP 146/107 and 161/103 mmHG. She denied s/sx of preeclampsia.  I recommended that she go to the MAU for NST and for BPP monitoring and treatment/evaluation.  Consider delivery for severe blood pressure unresponsive to treatment, consider delivery for preeclampsia with severe features.  Otherwise consider delivery at 37 weeks- given new onset hypertension.  I discussed the plan of care with Krystal Hartman, CNM    All questions answered   I spent 20 minutes with > 50% in face to face consultation.   Krystal Olive, MD

## 2022-06-16 ENCOUNTER — Other Ambulatory Visit (HOSPITAL_COMMUNITY)
Admission: RE | Admit: 2022-06-16 | Discharge: 2022-06-16 | Disposition: A | Discharge status: Home / Self Care | Payer: Medicaid Other | Source: Ambulatory Visit | Attending: Obstetrics and Gynecology | Admitting: Obstetrics and Gynecology

## 2022-06-16 ENCOUNTER — Other Ambulatory Visit: Payer: Self-pay

## 2022-06-16 ENCOUNTER — Telehealth: Payer: Self-pay | Admitting: Obstetrics and Gynecology

## 2022-06-16 ENCOUNTER — Telehealth (HOSPITAL_COMMUNITY): Payer: Self-pay | Admitting: *Deleted

## 2022-06-16 ENCOUNTER — Encounter (HOSPITAL_COMMUNITY): Payer: Self-pay

## 2022-06-16 ENCOUNTER — Ambulatory Visit (INDEPENDENT_AMBULATORY_CARE_PROVIDER_SITE_OTHER): Payer: Medicaid Other | Admitting: Obstetrics and Gynecology

## 2022-06-16 VITALS — BP 132/90 | HR 102 | Wt 336.0 lb

## 2022-06-16 DIAGNOSIS — O34219 Maternal care for unspecified type scar from previous cesarean delivery: Secondary | ICD-10-CM

## 2022-06-16 DIAGNOSIS — I10 Essential (primary) hypertension: Secondary | ICD-10-CM

## 2022-06-16 DIAGNOSIS — O0990 Supervision of high risk pregnancy, unspecified, unspecified trimester: Secondary | ICD-10-CM | POA: Diagnosis present

## 2022-06-16 DIAGNOSIS — Z9884 Bariatric surgery status: Secondary | ICD-10-CM

## 2022-06-16 DIAGNOSIS — Z8619 Personal history of other infectious and parasitic diseases: Secondary | ICD-10-CM

## 2022-06-16 DIAGNOSIS — Z3A36 36 weeks gestation of pregnancy: Secondary | ICD-10-CM

## 2022-06-16 DIAGNOSIS — O99013 Anemia complicating pregnancy, third trimester: Secondary | ICD-10-CM

## 2022-06-16 DIAGNOSIS — B009 Herpesviral infection, unspecified: Secondary | ICD-10-CM

## 2022-06-16 DIAGNOSIS — Z141 Cystic fibrosis carrier: Secondary | ICD-10-CM

## 2022-06-16 DIAGNOSIS — O0993 Supervision of high risk pregnancy, unspecified, third trimester: Secondary | ICD-10-CM

## 2022-06-16 NOTE — Patient Instructions (Signed)
Krystal Hartman  06/16/2022   Your procedure is scheduled on:  06/21/2022  Arrive at 1030 at Entrance C on [employer] at [facility]  and [employer]. You are invited to use the FREE valet parking or use the Visitor's parking deck.  Pick up the phone at the desk and dial [phone].  Call this number if you have problems the morning of surgery: [phone]  Remember:   Do not eat food:(After Midnight) Desps de medianoche.  Do not drink clear liquids: (After Midnight) Desps de medianoche.  Take these medicines the morning of surgery with A SIP OF WATER:  none   Do not wear jewelry, make-up or nail polish.  Do not wear lotions, powders, or perfumes. Do not wear deodorant.  Do not shave 48 hours prior to surgery.  Do not bring valuables to the hospital.  [facility] is not   responsible for any belongings or valuables brought to the hospital.  Contacts, dentures or bridgework may not be worn into surgery.  Leave suitcase in the car. After surgery it may be brought to your room.  For patients admitted to the hospital, checkout time is 11:00 AM the day of              discharge.      Please read over the following fact sheets that you were given:     Preparing for Surgery

## 2022-06-16 NOTE — Telephone Encounter (Signed)
Telephone call to patient regarding new surgery date/time.  Patient was not in, left detailed message for patient.   Surgery has been moved to 06/21/22 at 12:30pm at [facility] entrance C.

## 2022-06-16 NOTE — Telephone Encounter (Signed)
Preadmission screen  

## 2022-06-20 ENCOUNTER — Encounter: Payer: Self-pay | Admitting: *Deleted

## 2022-06-20 ENCOUNTER — Ambulatory Visit: Payer: Medicaid Other | Admitting: *Deleted

## 2022-06-20 ENCOUNTER — Other Ambulatory Visit (HOSPITAL_COMMUNITY)
Admission: RE | Admit: 2022-06-20 | Discharge: 2022-06-20 | Disposition: A | Discharge status: Home / Self Care | Payer: Medicaid Other | Source: Ambulatory Visit | Attending: Obstetrics and Gynecology | Admitting: Obstetrics and Gynecology

## 2022-06-20 ENCOUNTER — Ambulatory Visit (HOSPITAL_BASED_OUTPATIENT_CLINIC_OR_DEPARTMENT_OTHER): Payer: Medicaid Other | Admitting: *Deleted

## 2022-06-20 ENCOUNTER — Encounter (HOSPITAL_COMMUNITY): Payer: Self-pay

## 2022-06-20 VITALS — BP 149/93 | HR 78

## 2022-06-20 VITALS — BP 131/91

## 2022-06-20 DIAGNOSIS — Z3A37 37 weeks gestation of pregnancy: Secondary | ICD-10-CM

## 2022-06-20 DIAGNOSIS — Z01812 Encounter for preprocedural laboratory examination: Secondary | ICD-10-CM | POA: Insufficient documentation

## 2022-06-20 DIAGNOSIS — O34219 Maternal care for unspecified type scar from previous cesarean delivery: Secondary | ICD-10-CM | POA: Insufficient documentation

## 2022-06-20 DIAGNOSIS — O10913 Unspecified pre-existing hypertension complicating pregnancy, third trimester: Secondary | ICD-10-CM | POA: Insufficient documentation

## 2022-06-20 DIAGNOSIS — O0990 Supervision of high risk pregnancy, unspecified, unspecified trimester: Secondary | ICD-10-CM | POA: Insufficient documentation

## 2022-06-20 DIAGNOSIS — O99213 Obesity complicating pregnancy, third trimester: Secondary | ICD-10-CM | POA: Insufficient documentation

## 2022-06-20 DIAGNOSIS — Z3A27 27 weeks gestation of pregnancy: Secondary | ICD-10-CM | POA: Insufficient documentation

## 2022-06-20 DIAGNOSIS — O99013 Anemia complicating pregnancy, third trimester: Secondary | ICD-10-CM

## 2022-06-20 DIAGNOSIS — O10919 Unspecified pre-existing hypertension complicating pregnancy, unspecified trimester: Secondary | ICD-10-CM | POA: Diagnosis not present

## 2022-06-20 DIAGNOSIS — Z3A00 Weeks of gestation of pregnancy not specified: Secondary | ICD-10-CM | POA: Insufficient documentation

## 2022-06-20 LAB — CBC
HCT: 31.7 % — ABNORMAL LOW (ref 36.0–46.0)
Hemoglobin: 10.6 g/dL — ABNORMAL LOW (ref 12.0–15.0)
MCH: 30 pg (ref 26.0–34.0)
MCHC: 33.4 g/dL (ref 30.0–36.0)
MCV: 89.8 fL (ref 80.0–100.0)
Platelets: 198 10*3/uL (ref 150–400)
RBC: 3.53 MIL/uL — ABNORMAL LOW (ref 3.87–5.11)
RDW: 14.1 % (ref 11.5–15.5)
WBC: 5 10*3/uL (ref 4.0–10.5)
nRBC: 0 % (ref 0.0–0.2)

## 2022-06-20 LAB — CULTURE, BETA STREP (GROUP B ONLY): Strep Gp B Culture: NEGATIVE

## 2022-06-20 LAB — GC/CHLAMYDIA PROBE AMP (~~LOC~~) NOT AT ARMC
Chlamydia: NEGATIVE
Comment: NEGATIVE
Comment: NORMAL
Neisseria Gonorrhea: NEGATIVE

## 2022-06-20 NOTE — Procedures (Signed)
Krystal Hartman [birth year] [redacted]w[redacted]d  Fetus A Non-Stress Test Interpretation for 06/20/22  Indication: Chronic Hypertenstion and morbidly obese  Fetal Heart Rate A Mode: External Baseline Rate (A): 130 bpm Variability: Moderate Accelerations: 15 x 15 Decelerations: None Multiple birth?: No  Uterine Activity Mode: Toco Contraction Frequency (min): none Resting Tone Palpated: Relaxed  Interpretation (Fetal Testing) Nonstress Test Interpretation: Reactive Overall Impression: Reassuring for gestational age Comments: tracing reviewed by Dr. Gertie Exon

## 2022-06-21 ENCOUNTER — Encounter (HOSPITAL_COMMUNITY)
Admission: RE | Disposition: A | Discharge status: Home / Self Care | Payer: Self-pay | Source: Home / Self Care | Attending: Obstetrics and Gynecology

## 2022-06-21 ENCOUNTER — Inpatient Hospital Stay (HOSPITAL_COMMUNITY): Payer: Medicaid Other | Admitting: Anesthesiology

## 2022-06-21 ENCOUNTER — Encounter (HOSPITAL_COMMUNITY): Payer: Self-pay | Admitting: Obstetrics and Gynecology

## 2022-06-21 ENCOUNTER — Other Ambulatory Visit: Payer: Self-pay

## 2022-06-21 ENCOUNTER — Inpatient Hospital Stay (HOSPITAL_COMMUNITY)
Admission: RE | Admit: 2022-06-21 | Discharge: 2022-06-23 | DRG: 787 | Disposition: A | Discharge status: Home / Self Care | Payer: Medicaid Other | Attending: Obstetrics and Gynecology | Admitting: Obstetrics and Gynecology

## 2022-06-21 DIAGNOSIS — Z3A37 37 weeks gestation of pregnancy: Secondary | ICD-10-CM

## 2022-06-21 DIAGNOSIS — O99214 Obesity complicating childbirth: Secondary | ICD-10-CM

## 2022-06-21 DIAGNOSIS — O9962 Diseases of the digestive system complicating childbirth: Secondary | ICD-10-CM | POA: Diagnosis present

## 2022-06-21 DIAGNOSIS — O34211 Maternal care for low transverse scar from previous cesarean delivery: Principal | ICD-10-CM | POA: Diagnosis present

## 2022-06-21 DIAGNOSIS — O99844 Bariatric surgery status complicating childbirth: Secondary | ICD-10-CM | POA: Diagnosis present

## 2022-06-21 DIAGNOSIS — Z91040 Latex allergy status: Secondary | ICD-10-CM | POA: Diagnosis not present

## 2022-06-21 DIAGNOSIS — K660 Peritoneal adhesions (postprocedural) (postinfection): Secondary | ICD-10-CM

## 2022-06-21 DIAGNOSIS — D62 Acute posthemorrhagic anemia: Secondary | ICD-10-CM | POA: Diagnosis not present

## 2022-06-21 DIAGNOSIS — O9902 Anemia complicating childbirth: Secondary | ICD-10-CM | POA: Diagnosis present

## 2022-06-21 DIAGNOSIS — D509 Iron deficiency anemia, unspecified: Secondary | ICD-10-CM | POA: Insufficient documentation

## 2022-06-21 DIAGNOSIS — O1002 Pre-existing essential hypertension complicating childbirth: Secondary | ICD-10-CM | POA: Diagnosis present

## 2022-06-21 DIAGNOSIS — K219 Gastro-esophageal reflux disease without esophagitis: Secondary | ICD-10-CM | POA: Diagnosis present

## 2022-06-21 DIAGNOSIS — B009 Herpesviral infection, unspecified: Secondary | ICD-10-CM | POA: Diagnosis present

## 2022-06-21 DIAGNOSIS — I10 Essential (primary) hypertension: Secondary | ICD-10-CM | POA: Diagnosis present

## 2022-06-21 DIAGNOSIS — O9832 Other infections with a predominantly sexual mode of transmission complicating childbirth: Secondary | ICD-10-CM | POA: Diagnosis present

## 2022-06-21 DIAGNOSIS — Z98891 History of uterine scar from previous surgery: Secondary | ICD-10-CM

## 2022-06-21 DIAGNOSIS — L732 Hidradenitis suppurativa: Secondary | ICD-10-CM | POA: Diagnosis present

## 2022-06-21 DIAGNOSIS — Z141 Cystic fibrosis carrier: Secondary | ICD-10-CM | POA: Diagnosis not present

## 2022-06-21 DIAGNOSIS — O99892 Other specified diseases and conditions complicating childbirth: Secondary | ICD-10-CM

## 2022-06-21 DIAGNOSIS — Z8614 Personal history of Methicillin resistant Staphylococcus aureus infection: Secondary | ICD-10-CM

## 2022-06-21 DIAGNOSIS — A6000 Herpesviral infection of urogenital system, unspecified: Secondary | ICD-10-CM | POA: Diagnosis present

## 2022-06-21 DIAGNOSIS — O34219 Maternal care for unspecified type scar from previous cesarean delivery: Secondary | ICD-10-CM | POA: Diagnosis present

## 2022-06-21 DIAGNOSIS — O0990 Supervision of high risk pregnancy, unspecified, unspecified trimester: Secondary | ICD-10-CM

## 2022-06-21 DIAGNOSIS — Z9884 Bariatric surgery status: Secondary | ICD-10-CM

## 2022-06-21 DIAGNOSIS — N736 Female pelvic peritoneal adhesions (postinfective): Secondary | ICD-10-CM

## 2022-06-21 LAB — CBC
HCT: 27.6 % — ABNORMAL LOW (ref 36.0–46.0)
HCT: 29 % — ABNORMAL LOW (ref 36.0–46.0)
Hemoglobin: 9.5 g/dL — ABNORMAL LOW (ref 12.0–15.0)
Hemoglobin: 9.8 g/dL — ABNORMAL LOW (ref 12.0–15.0)
MCH: 31 pg (ref 26.0–34.0)
MCH: 30.2 pg (ref 26.0–34.0)
MCHC: 34.4 g/dL (ref 30.0–36.0)
MCHC: 33.8 g/dL (ref 30.0–36.0)
MCV: 90.2 fL (ref 80.0–100.0)
MCV: 89.5 fL (ref 80.0–100.0)
Platelets: 179 K/uL (ref 150–400)
Platelets: 179 10*3/uL (ref 150–400)
RBC: 3.06 MIL/uL — ABNORMAL LOW (ref 3.87–5.11)
RBC: 3.24 MIL/uL — ABNORMAL LOW (ref 3.87–5.11)
RDW: 14 % (ref 11.5–15.5)
RDW: 14.1 % (ref 11.5–15.5)
WBC: 7.3 K/uL (ref 4.0–10.5)
WBC: 9.7 10*3/uL (ref 4.0–10.5)
nRBC: 0 % (ref 0.0–0.2)
nRBC: 0 % (ref 0.0–0.2)

## 2022-06-21 LAB — APTT: aPTT: 33 seconds (ref 24–36)

## 2022-06-21 LAB — FIBRINOGEN: Fibrinogen: 600 mg/dL — ABNORMAL HIGH (ref 210–475)

## 2022-06-21 LAB — PROTIME-INR
INR: 1.1 (ref 0.8–1.2)
Prothrombin Time: 14.5 seconds (ref 11.4–15.2)

## 2022-06-21 LAB — PREPARE RBC (CROSSMATCH)

## 2022-06-21 LAB — RPR: RPR Ser Ql: NONREACTIVE

## 2022-06-21 SURGERY — Surgical Case
Anesthesia: Spinal

## 2022-06-21 SURGERY — LAPAROTOMY, EXPLORATORY
Anesthesia: General

## 2022-06-21 MED ORDER — LACTATED RINGERS IV SOLN: INTRAVENOUS | Status: DC

## 2022-06-21 MED ORDER — ETOMIDATE 2 MG/ML IV SOLN
INTRAVENOUS | Status: AC
  Filled 2022-06-21: qty 10

## 2022-06-21 MED ORDER — PHENYLEPHRINE HCL-NACL 20-0.9 MG/250ML-% IV SOLN
INTRAVENOUS | Status: DC | PRN
  Administered 2022-06-21: 60 ug/min via INTRAVENOUS

## 2022-06-21 MED ORDER — TRANEXAMIC ACID-NACL 1000-0.7 MG/100ML-% IV SOLN
INTRAVENOUS | Status: DC | PRN
  Administered 2022-06-21: 1000 mg via INTRAVENOUS

## 2022-06-21 MED ORDER — DIBUCAINE (PERIANAL) 1 % EX OINT: 1.0000 | TOPICAL_OINTMENT | CUTANEOUS | Status: DC | PRN

## 2022-06-21 MED ORDER — FENTANYL CITRATE (PF) 100 MCG/2ML IJ SOLN
INTRAMUSCULAR | Status: AC
  Filled 2022-06-21: qty 2

## 2022-06-21 MED ORDER — ACETAMINOPHEN 10 MG/ML IV SOLN: 1000.0000 mg | Freq: Once | INTRAVENOUS | Status: DC | PRN

## 2022-06-21 MED ORDER — MENTHOL 3 MG MT LOZG: 1.0000 | LOZENGE | OROMUCOSAL | Status: DC | PRN

## 2022-06-21 MED ORDER — ALBUMIN HUMAN 5 % IV SOLN: INTRAVENOUS | Status: DC | PRN

## 2022-06-21 MED ORDER — OXYTOCIN-SODIUM CHLORIDE 30-0.9 UT/500ML-% IV SOLN
INTRAVENOUS | Status: AC
  Filled 2022-06-21: qty 500

## 2022-06-21 MED ORDER — CEFAZOLIN IN SODIUM CHLORIDE 3-0.9 GM/100ML-% IV SOLN
INTRAVENOUS | Status: AC
  Filled 2022-06-21: qty 100

## 2022-06-21 MED ORDER — SODIUM CHLORIDE 0.9% IV SOLUTION: Freq: Once | INTRAVENOUS | Status: DC

## 2022-06-21 MED ORDER — ACETAMINOPHEN 500 MG PO TABS
1000.0000 mg | ORAL_TABLET | Freq: Four times a day (QID) | ORAL | Status: DC
  Administered 2022-06-22 – 2022-06-23 (×6): 1000 mg via ORAL
  Filled 2022-06-21 (×6): qty 2

## 2022-06-21 MED ORDER — FENTANYL CITRATE (PF) 100 MCG/2ML IJ SOLN: 25.0000 ug | INTRAMUSCULAR | Status: DC | PRN

## 2022-06-21 MED ORDER — TETANUS-DIPHTH-ACELL PERTUSSIS 5-2.5-18.5 LF-MCG/0.5 IM SUSY: 0.5000 mL | PREFILLED_SYRINGE | Freq: Once | INTRAMUSCULAR | Status: DC

## 2022-06-21 MED ORDER — PARAGARD INTRAUTERINE COPPER IU IUD
INTRAUTERINE_SYSTEM | INTRAUTERINE | Status: AC
  Filled 2022-06-21: qty 2

## 2022-06-21 MED ORDER — HYDROMORPHONE HCL 1 MG/ML IJ SOLN
INTRAMUSCULAR | Status: AC
  Filled 2022-06-21: qty 0.5

## 2022-06-21 MED ORDER — IBUPROFEN 600 MG PO TABS
600.0000 mg | ORAL_TABLET | Freq: Four times a day (QID) | ORAL | Status: DC
  Administered 2022-06-23 (×3): 600 mg via ORAL
  Filled 2022-06-21 (×3): qty 1

## 2022-06-21 MED ORDER — SCOPOLAMINE 1 MG/3DAYS TD PT72
MEDICATED_PATCH | TRANSDERMAL | Status: DC | PRN
  Administered 2022-06-21: 1 via TRANSDERMAL

## 2022-06-21 MED ORDER — HYDROMORPHONE HCL 1 MG/ML IJ SOLN
1.0000 mg | INTRAMUSCULAR | Status: DC | PRN
  Administered 2022-06-21: 1 mg via INTRAVENOUS

## 2022-06-21 MED ORDER — HYDROMORPHONE HCL 1 MG/ML IJ SOLN
INTRAMUSCULAR | Status: AC
  Filled 2022-06-21: qty 1

## 2022-06-21 MED ORDER — SODIUM CHLORIDE 0.9% IV SOLUTION: Freq: Once | INTRAVENOUS | Status: AC

## 2022-06-21 MED ORDER — SOD CITRATE-CITRIC ACID 500-334 MG/5ML PO SOLN
30.0000 mL | ORAL | Status: AC
  Administered 2022-06-21: 30 mL via ORAL

## 2022-06-21 MED ORDER — SCOPOLAMINE 1 MG/3DAYS TD PT72
MEDICATED_PATCH | TRANSDERMAL | Status: AC
  Filled 2022-06-21: qty 1

## 2022-06-21 MED ORDER — MORPHINE SULFATE (PF) 0.5 MG/ML IJ SOLN
INTRAMUSCULAR | Status: AC
  Filled 2022-06-21: qty 10

## 2022-06-21 MED ORDER — WITCH HAZEL-GLYCERIN EX PADS: 1.0000 | MEDICATED_PAD | CUTANEOUS | Status: DC | PRN

## 2022-06-21 MED ORDER — TRANEXAMIC ACID-NACL 1000-0.7 MG/100ML-% IV SOLN: 1000.0000 mg | INTRAVENOUS | Status: AC

## 2022-06-21 MED ORDER — ONDANSETRON HCL 4 MG/2ML IJ SOLN
INTRAMUSCULAR | Status: AC
  Filled 2022-06-21: qty 2

## 2022-06-21 MED ORDER — BUPIVACAINE IN DEXTROSE 0.75-8.25 % IT SOLN
INTRATHECAL | Status: DC | PRN
  Administered 2022-06-21: 1.8 mL via INTRATHECAL

## 2022-06-21 MED ORDER — SIMETHICONE 80 MG PO CHEW: 80.0000 mg | CHEWABLE_TABLET | ORAL | Status: DC | PRN

## 2022-06-21 MED ORDER — ACETAMINOPHEN 500 MG PO TABS: 1000.0000 mg | ORAL_TABLET | Freq: Once | ORAL | Status: DC | PRN

## 2022-06-21 MED ORDER — TRANEXAMIC ACID-NACL 1000-0.7 MG/100ML-% IV SOLN
INTRAVENOUS | Status: AC
  Filled 2022-06-21: qty 100

## 2022-06-21 MED ORDER — SOD CITRATE-CITRIC ACID 500-334 MG/5ML PO SOLN
ORAL | Status: AC
  Filled 2022-06-21: qty 30

## 2022-06-21 MED ORDER — EPHEDRINE SULFATE-NACL 50-0.9 MG/10ML-% IV SOSY
PREFILLED_SYRINGE | INTRAVENOUS | Status: DC | PRN
  Administered 2022-06-21: 5 mg via INTRAVENOUS
  Administered 2022-06-21 (×2): 10 mg via INTRAVENOUS

## 2022-06-21 MED ORDER — ACETAMINOPHEN 10 MG/ML IV SOLN
INTRAVENOUS | Status: AC
  Filled 2022-06-21: qty 100

## 2022-06-21 MED ORDER — SENNOSIDES-DOCUSATE SODIUM 8.6-50 MG PO TABS
2.0000 | ORAL_TABLET | Freq: Every day | ORAL | Status: DC
  Administered 2022-06-22 – 2022-06-23 (×2): 2 via ORAL
  Filled 2022-06-21 (×2): qty 2

## 2022-06-21 MED ORDER — HYDROMORPHONE HCL 1 MG/ML IJ SOLN
0.5000 mg | INTRAMUSCULAR | Status: DC | PRN
  Administered 2022-06-21 (×2): 0.5 mg via INTRAVENOUS

## 2022-06-21 MED ORDER — COCONUT OIL OIL: 1.0000 | TOPICAL_OIL | Status: DC | PRN

## 2022-06-21 MED ORDER — OXYCODONE HCL 5 MG PO TABS
5.0000 mg | ORAL_TABLET | ORAL | Status: DC | PRN
  Administered 2022-06-22 – 2022-06-23 (×5): 10 mg via ORAL
  Filled 2022-06-21 (×5): qty 2

## 2022-06-21 MED ORDER — DEXAMETHASONE SODIUM PHOSPHATE 10 MG/ML IJ SOLN
INTRAMUSCULAR | Status: AC
  Filled 2022-06-21: qty 1

## 2022-06-21 MED ORDER — ZOLPIDEM TARTRATE 5 MG PO TABS: 5.0000 mg | ORAL_TABLET | Freq: Every evening | ORAL | Status: DC | PRN

## 2022-06-21 MED ORDER — KETOROLAC TROMETHAMINE 30 MG/ML IJ SOLN
30.0000 mg | Freq: Four times a day (QID) | INTRAMUSCULAR | Status: AC
  Administered 2022-06-22 (×4): 30 mg via INTRAVENOUS
  Filled 2022-06-21 (×4): qty 1

## 2022-06-21 MED ORDER — PHENYLEPHRINE 80 MCG/ML (10ML) SYRINGE FOR IV PUSH (FOR BLOOD PRESSURE SUPPORT)
PREFILLED_SYRINGE | INTRAVENOUS | Status: AC
  Filled 2022-06-21: qty 10

## 2022-06-21 MED ORDER — ACETAMINOPHEN 160 MG/5ML PO SOLN: 1000.0000 mg | Freq: Once | ORAL | Status: DC | PRN

## 2022-06-21 MED ORDER — SIMETHICONE 80 MG PO CHEW
80.0000 mg | CHEWABLE_TABLET | Freq: Three times a day (TID) | ORAL | Status: DC
  Administered 2022-06-22 – 2022-06-23 (×5): 80 mg via ORAL
  Filled 2022-06-21 (×5): qty 1

## 2022-06-21 MED ORDER — ACETAMINOPHEN 10 MG/ML IV SOLN
INTRAVENOUS | Status: DC | PRN
  Administered 2022-06-21: 1000 mg via INTRAVENOUS

## 2022-06-21 MED ORDER — ATROPINE SULFATE 0.4 MG/ML IV SOLN
INTRAVENOUS | Status: AC
  Filled 2022-06-21: qty 1

## 2022-06-21 MED ORDER — POVIDONE-IODINE 10 % EX SWAB: 2.0000 | Freq: Once | CUTANEOUS | Status: DC

## 2022-06-21 MED ORDER — ENOXAPARIN SODIUM 40 MG/0.4ML IJ SOSY: 40.0000 mg | PREFILLED_SYRINGE | INTRAMUSCULAR | Status: DC

## 2022-06-21 MED ORDER — OXYTOCIN-SODIUM CHLORIDE 30-0.9 UT/500ML-% IV SOLN: 2.5000 [IU]/h | INTRAVENOUS | Status: AC

## 2022-06-21 MED ORDER — MORPHINE SULFATE (PF) 0.5 MG/ML IJ SOLN
INTRAMUSCULAR | Status: DC | PRN
  Administered 2022-06-21: 150 ug via INTRATHECAL

## 2022-06-21 MED ORDER — PHENYLEPHRINE HCL (PRESSORS) 10 MG/ML IV SOLN
INTRAVENOUS | Status: DC | PRN
  Administered 2022-06-21 (×2): 80 ug via INTRAVENOUS
  Administered 2022-06-21 (×2): 160 ug via INTRAVENOUS
  Administered 2022-06-21 (×3): 80 ug via INTRAVENOUS
  Administered 2022-06-21: 160 ug via INTRAVENOUS
  Administered 2022-06-21: 80 ug via INTRAVENOUS
  Administered 2022-06-21: 160 ug via INTRAVENOUS
  Administered 2022-06-21 (×5): 80 ug via INTRAVENOUS
  Administered 2022-06-21 (×2): 160 ug via INTRAVENOUS

## 2022-06-21 MED ORDER — PRENATAL MULTIVITAMIN CH
1.0000 | ORAL_TABLET | Freq: Every day | ORAL | Status: DC
  Administered 2022-06-22 – 2022-06-23 (×2): 1 via ORAL
  Filled 2022-06-21 (×2): qty 1

## 2022-06-21 MED ORDER — MEDROXYPROGESTERONE ACETATE 150 MG/ML IM SUSP: 150.0000 mg | INTRAMUSCULAR | Status: DC | PRN

## 2022-06-21 MED ORDER — GABAPENTIN 100 MG PO CAPS
200.0000 mg | ORAL_CAPSULE | Freq: Every day | ORAL | Status: DC
  Administered 2022-06-22 – 2022-06-23 (×2): 200 mg via ORAL
  Filled 2022-06-21 (×2): qty 2

## 2022-06-21 MED ORDER — MEASLES, MUMPS & RUBELLA VAC IJ SOLR: 0.5000 mL | Freq: Once | INTRAMUSCULAR | Status: DC

## 2022-06-21 MED ORDER — ATROPINE SULFATE 0.4 MG/ML IV SOLN
INTRAVENOUS | Status: DC | PRN
  Administered 2022-06-21: .2 mg via INTRAVENOUS

## 2022-06-21 MED ORDER — OXYTOCIN-SODIUM CHLORIDE 30-0.9 UT/500ML-% IV SOLN
INTRAVENOUS | Status: DC | PRN
  Administered 2022-06-21: 300 mL via INTRAVENOUS

## 2022-06-21 MED ORDER — FENTANYL CITRATE (PF) 100 MCG/2ML IJ SOLN
INTRAMUSCULAR | Status: DC | PRN
  Administered 2022-06-21: 85 ug via INTRAVENOUS

## 2022-06-21 MED ORDER — DEXAMETHASONE SODIUM PHOSPHATE 4 MG/ML IJ SOLN
INTRAMUSCULAR | Status: AC
  Filled 2022-06-21: qty 1

## 2022-06-21 MED ORDER — EPHEDRINE 5 MG/ML INJ
INTRAVENOUS | Status: AC
  Filled 2022-06-21: qty 5

## 2022-06-21 MED ORDER — MAGNESIUM HYDROXIDE 400 MG/5ML PO SUSP: 30.0000 mL | ORAL | Status: DC | PRN

## 2022-06-21 MED ORDER — ONDANSETRON HCL 4 MG/2ML IJ SOLN
INTRAMUSCULAR | Status: DC | PRN
  Administered 2022-06-21 (×2): 4 mg via INTRAVENOUS

## 2022-06-21 MED ORDER — DEXAMETHASONE SODIUM PHOSPHATE 4 MG/ML IJ SOLN
INTRAMUSCULAR | Status: DC | PRN
  Administered 2022-06-21: 8 mg via INTRAVENOUS

## 2022-06-21 MED ORDER — PROPOFOL 10 MG/ML IV BOLUS
INTRAVENOUS | Status: AC
  Filled 2022-06-21: qty 20

## 2022-06-21 MED ORDER — CEFAZOLIN IN SODIUM CHLORIDE 3-0.9 GM/100ML-% IV SOLN
3.0000 g | INTRAVENOUS | Status: AC
  Administered 2022-06-21: 3 g via INTRAVENOUS

## 2022-06-21 MED ORDER — FENTANYL CITRATE (PF) 100 MCG/2ML IJ SOLN
INTRAMUSCULAR | Status: DC | PRN
  Administered 2022-06-21: 15 ug via INTRATHECAL

## 2022-06-21 MED ORDER — DIPHENHYDRAMINE HCL 25 MG PO CAPS: 25.0000 mg | ORAL_CAPSULE | Freq: Four times a day (QID) | ORAL | Status: DC | PRN

## 2022-06-21 SURGICAL SUPPLY — 44 items
APL PRP STRL LF DISP 70% ISPRP (MISCELLANEOUS) ×2
APL SKNCLS STERI-STRIP NONHPOA (GAUZE/BANDAGES/DRESSINGS) ×1
BARRIER ADHS 3X4 INTERCEED (GAUZE/BANDAGES/DRESSINGS) IMPLANT
BENZOIN TINCTURE PRP APPL 2/3 (GAUZE/BANDAGES/DRESSINGS) IMPLANT
BRR ADH 4X3 ABS CNTRL BYND (GAUZE/BANDAGES/DRESSINGS) ×1
CHLORAPREP W/TINT 26 (MISCELLANEOUS) ×2 IMPLANT
CLAMP UMBILICAL CORD (MISCELLANEOUS) ×1 IMPLANT
CLOTH BEACON ORANGE TIMEOUT ST (SAFETY) ×1 IMPLANT
DRESSING PREVENA PLUS CUSTOM (GAUZE/BANDAGES/DRESSINGS) IMPLANT
DRSG OPSITE POSTOP 4X10 (GAUZE/BANDAGES/DRESSINGS) ×1 IMPLANT
DRSG PREVENA PLUS CUSTOM (GAUZE/BANDAGES/DRESSINGS) ×1
ELECT REM PT RETURN 9FT ADLT (ELECTROSURGICAL) ×1
ELECTRODE REM PT RTRN 9FT ADLT (ELECTROSURGICAL) ×1 IMPLANT
EXTENDER TRAXI PANNICULUS (MISCELLANEOUS) IMPLANT
EXTRACTOR VACUUM KIWI (MISCELLANEOUS) IMPLANT
GAUZE SPONGE 4X4 12PLY STRL LF (GAUZE/BANDAGES/DRESSINGS) IMPLANT
GLOVE SURG ORTHO 8.0 STRL STRW (GLOVE) ×1 IMPLANT
GOWN STRL REUS W/TWL LRG LVL3 (GOWN DISPOSABLE) ×2 IMPLANT
HEMOSTAT ARISTA ABSORB 3G PWDR (HEMOSTASIS) IMPLANT
KIT ABG SYR 3ML LUER SLIP (SYRINGE) IMPLANT
NDL HYPO 25X5/8 SAFETYGLIDE (NEEDLE) IMPLANT
NEEDLE HYPO 25X5/8 SAFETYGLIDE (NEEDLE) IMPLANT
NS IRRIG 1000ML POUR BTL (IV SOLUTION) ×1 IMPLANT
PACK C SECTION WH (CUSTOM PROCEDURE TRAY) ×1 IMPLANT
PAD ABD 7.5X8 STRL (GAUZE/BANDAGES/DRESSINGS) IMPLANT
PAD OB MATERNITY 4.3X12.25 (PERSONAL CARE ITEMS) ×1 IMPLANT
RETRACTOR TRAXI PANNICULUS (MISCELLANEOUS) IMPLANT
RTRCTR C-SECT PINK 25CM LRG (MISCELLANEOUS) IMPLANT
STRIP CLOSURE SKIN 1/2X4 (GAUZE/BANDAGES/DRESSINGS) IMPLANT
SUT MON AB-0 CT1 36 (SUTURE) ×2 IMPLANT
SUT PLAIN 0 NONE (SUTURE) IMPLANT
SUT VIC AB 0 CT1 27 (SUTURE) ×4
SUT VIC AB 0 CT1 27XBRD ANBCTR (SUTURE) ×2 IMPLANT
SUT VIC AB 0 CT1 36 (SUTURE) IMPLANT
SUT VIC AB 2-0 CT1 27 (SUTURE) ×1
SUT VIC AB 2-0 CT1 TAPERPNT 27 (SUTURE) ×1 IMPLANT
SUT VIC AB 3-0 CT1 27 (SUTURE) ×1
SUT VIC AB 3-0 CT1 TAPERPNT 27 (SUTURE) IMPLANT
SUT VIC AB 4-0 KS 27 (SUTURE) IMPLANT
SUT VIC AB 4-0 SH 27 (SUTURE) ×1
SUT VIC AB 4-0 SH 27XANBCTRL (SUTURE) ×1 IMPLANT
TOWEL OR 17X24 6PK STRL BLUE (TOWEL DISPOSABLE) ×1 IMPLANT
TRAY FOLEY W/BAG SLVR 14FR LF (SET/KITS/TRAYS/PACK) ×1 IMPLANT
WATER STERILE IRR 1000ML POUR (IV SOLUTION) ×1 IMPLANT

## 2022-06-21 NOTE — H&P (Signed)
Faculty Practice H&P  Krystal Hartman is a 32 y.o. female [phone] with IUP at [redacted]w[redacted]d presenting for repeat cesarean section for hx of prior CS x 4. 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 .    Pt states she has not been having contractions,  vaginal bleeding, intact membranes, with normal fetal movement.     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  She plans to breastfeed  Prenatal labs and studies: ABO, Rh: --/--/A POS (01/02 1115) Antibody: NEG (01/02 1115) Rubella: 2.09 (07/19 1506) RPR: NON REACTIVE (01/02 1113)  HBsAg: Negative (07/19 1506)  HIV: Non Reactive (11/16 1650)  GBS: Negative/-- (12/29 1024)  2hr Glucola: Did not complete, tracked CBGs for one week which were normal.  Genetic screening: normal Anatomy US: normal  Past Medical History:  Past Medical History:  Diagnosis Date   Anemia    Boil    on side   Cesarean section wound complication 02/19/8100   Consider wound vac    Hx of preeclampsia, prior pregnancy, currently pregnant 11/21/2018   Hypertension    started on BP med  yesterday   MRSA (methicillin resistant Staphylococcus aureus) colonization    chronic intermittent problem with boils-occassionally test positive for MRSA. None present at PAT appt.   PONV (postoperative nausea and vomiting) 2013   pt. states she had a lot of nausea and vomiting after her last c/s  here at [facility]   Pregnancy induced hypertension    Previous cesarean delivery, antepartum condition or complication 12/22/1023   For tertiary C/S, no tubal    Severe preeclampsia superimposed on chronic hypertension, delivered 06/10/2011   [ ]  Aspirin 81 mg daily after 12 weeks Current antihypertensives:  None   Baseline and surveillance labs (pulled in from [facility], refresh links as needed)  Lab Results Component Value Date  PLT 306 12/02/2018  CREATININE 0.63 12/02/2018  AST 14 12/02/2018  ALT 11 12/02/2018  PROTEIN24HR 156 (H) 05/27/2011   Antenatal Testing CHTN - O10.919  Group I   BP < 140/90, no meds, no preeclampsia, AGA, nml AF    Past Surgical History:  Past Surgical History:  Procedure Laterality Date   CESAREAN SECTION     CESAREAN SECTION  06/22/2011   Procedure: CESAREAN SECTION;  Surgeon: Eldred Manges, MD;  Location: [facility];  Service: Gynecology;  Laterality: N/A;  MRSA positive, Latex Allergy   CESAREAN SECTION N/A 12/11/2012   Procedure: REPEAT CESAREAN SECTION;  Surgeon: Woodroe Mode, MD;  Location: [facility];  Service: Obstetrics;  Laterality: N/A;   CESAREAN SECTION MULTI-GESTATIONAL N/A 05/06/2019   Procedure: CESAREAN SECTION MULTI-GESTATIONAL;  Surgeon: Osborne Oman, MD;  Location: [facility];  Service: Obstetrics;  Laterality: N/A;   EYE SURGERY     IUD REMOVAL N/A 08/29/2017   Procedure: HYSTEROSCOPY WITH INTRAUTERINE DEVICE (IUD) REMOVAL;  Surgeon: Osborne Oman, MD;  Location: [facility];  Service: Gynecology;  Laterality: N/A;   LAPAROSCOPIC GASTRIC SLEEVE RESECTION  04/2021    Obstetrical History:  OB History     Gravida  6   Para  4   Term  3    Preterm  1   AB  1   Living  5      SAB  1   IAB  0   Ectopic  0   Multiple  1   Live Births  5           Gynecological History:  OB History     Gravida  6   Para  4   Term  3   Preterm  1   AB  1   Living  5      SAB  1   IAB  0   Ectopic  0   Multiple  1   Live Births  5           Social History:  Social History   Socioeconomic History   Marital status: Married    Spouse name: Not on file   Number of children: Not on file   Years of education: Not on file   Highest education level: Not on file  Occupational History   Not on file  Tobacco Use   Smoking status: Never   Smokeless tobacco: Never  Vaping Use   Vaping Use: Never used  Substance and Sexual Activity   Alcohol use: No   Drug use: No   Sexual activity: Yes    Birth control/protection: None  Other Topics Concern   Not on file  Social History Narrative   Not on file   Social Determinants of Health   Financial Resource Strain: Not on file  Food Insecurity: No Food Insecurity (03/13/2022)   Hunger Vital Sign    Worried About Running Out of Food in the Last Year: Never true    Ran Out of Food in the Last Year: Never true  Transportation Needs: No Transportation Needs (03/13/2022)   PRAPARE - Hydrologist (Medical): No    Lack of Transportation (Non-Medical): No  Physical Activity: Not on file  Stress: Not on file  Social Connections: Not on file    Family History:  Family History  Problem Relation Age of Onset   Asthma Mother    Hypertension Mother    Diabetes Mother    Diabetes Father    Heart disease Father    Colon cancer Father    Diabetes Maternal Aunt    Cancer Maternal Grandmother    Stroke Neg Hx     Medications:  Prenatal vitamins,  Current Facility-Administered Medications  Medication Dose Route Frequency Provider Last Rate Last Admin   ceFAZolin (ANCEF) IVPB 3g/100 mL premix  3 g Intravenous On Call to OR  Clarnce Flock, MD       lactated ringers infusion   Intravenous Continuous Clarnce Flock, MD       povidone-iodine 10 % swab 2 Application  2 Application Topical Once Clarnce Flock, MD       sodium citrate-citric acid (ORACIT) solution 30 mL  30 mL Oral On Call to OR Clarnce Flock, MD  Allergies:  Allergies  Allergen Reactions   Latex Hives and Itching   Pork-Derived Products Other (See Comments)    Does not eat due to religious reasons.    Review of Systems: -  Neg  Physical Exam: Temperature 98.2 F (36.8 C), temperature source Oral, last menstrual period 10/03/2021. GENERAL: Well-developed, well-nourished female in no acute distress.  LUNGS: Normal work of breathing HEART: Regular rate  ABDOMEN: Soft, nontender, nondistended, gravid. EFW 8 lbs EXTREMITIES: Nontender, no edema, 2+ distal pulses.  [redacted]w[redacted]d [place] cephalic presentation, EFW 3219g (80%), posterior placenta   Pertinent Labs/Studies:   Lab Results  Component Value Date   WBC 5.0 06/20/2022   HGB 10.6 (L) 06/20/2022   HCT 31.7 (L) 06/20/2022   MCV 89.8 06/20/2022   PLT 198 06/20/2022    Assessment : LUA FENG is a 32 y.o. [id number] at [redacted]w[redacted]d being admitted for cesarean section secondary to prior CS x4   Plan: The risks of cesarean section discussed with the patient included but were not limited to: bleeding which may require transfusion or reoperation; infection which may require antibiotics; injury to bowel, bladder, ureters or other surrounding organs; injury to the fetus; need for additional procedures including hysterectomy in the event of a life-threatening hemorrhage; placental abnormalities wth subsequent pregnancies, incisional problems, thromboembolic phenomenon and other postoperative/anesthesia complications. The patient concurred with the proposed plan, giving informed written consent for the procedure.   Patient has been NPO since last night and will remain NPO for procedure.   Preoperative prophylactic Ancef ordered on call to the OR.    Concepcion Living, MD 06/21/2022, 11:27 AM

## 2022-06-21 NOTE — Transfer of Care (Addendum)
Immediate Anesthesia Transfer of Care Note  Patient: Krystal Hartman  Procedure(s) Performed: CESAREAN SECTION  Patient Location: PACU  Anesthesia Type:Spinal  Level of Consciousness: awake, alert , and oriented  Airway & Oxygen Therapy: Patient Spontanous Breathing  Post-op Assessment: Post -op Vital signs reviewed and unstable, Anesthesiologist notified along with surgeon. BP 77/39 161mcg phenylephrine given and BP up tp 95K systolic. 2 units of blood ordered by surgeon.  Post vital signs: stable, after phenylephrine bolus.  Last Vitals:  Vitals Value Taken Time  BP 127/72 06/21/22 1800  Temp 36.9 C 06/21/22 1730  Pulse 70 06/21/22 1815  Resp 21 06/21/22 1815  SpO2 92 % 06/21/22 1815  Vitals shown include unvalidated device data.  Last Pain:  Vitals:   06/21/22 1800  TempSrc:   PainSc: 8       Patients Stated Pain Goal: 2 (93/26/71 2458)  Complications: No notable events documented.

## 2022-06-21 NOTE — Op Note (Signed)
[name] PROCEDURE DATE: 06/21/2022  PREOPERATIVE DIAGNOSES: Intrauterine pregnancy at [redacted]w[redacted]d weeks gestation; previous uterine incision x4, maternal morbid obesity, BMI 34  POSTOPERATIVE DIAGNOSES: The same with severe abdominal adhesive disease  PROCEDURE: Repeat Low Transverse Cesarean Section x 5, lysis of omental adhesions SURGEON:  Dr. Lynnda Hartman  ASSISTANT:  Krystal Halim, MD, Krystal Shave, MD  ANESTHESIOLOGY TEAM: Anesthesiologist: Krystal Mouse, MD CRNA: Rhymer, Waynard Reeds, CRNA  An experienced assistant was required given the standard of surgical care given the complexity of the case.  This assistant was needed for exposure, dissection, suctioning, retraction, instrument exchange,  assisting with delivery with administration of fundal pressure, and for overall help during the procedure.   INDICATIONS: Krystal Hartman is a 32 y.o. [phone] at [redacted]w[redacted]d here for cesarean section secondary to the indications listed under preoperative diagnoses; please see preoperative note for further details.  The risks of surgery were discussed with the patient including but were not limited to: bleeding which may require transfusion or reoperation; infection which may require antibiotics; injury to bowel, bladder, ureters or other surrounding organs; injury to the fetus; need for additional procedures including hysterectomy in the event of a life-threatening hemorrhage; formation of adhesions; placental abnormalities wth subsequent pregnancies; incisional problems; thromboembolic phenomenon and other postoperative/anesthesia complications.  The patient concurred with the proposed plan, giving informed written consent for the procedure.    FINDINGS:  Viable female infant in cephalic presentation.  Apgars 8 and 9.  Clear amniotic fluid.  Intact placenta, three vessel cord.  Normal uterus, fallopian tubes and ovaries bilaterally.  ANESTHESIA: Spinal INTRAVENOUS FLUIDS: 3000 ml    ESTIMATED BLOOD LOSS: 1023 ml URINE OUTPUT:  500 ml Albumin 500 ml SPECIMENS: Placenta sent to L&D COMPLICATIONS: None immediate  PROCEDURE IN DETAIL:  The patient preoperatively received intravenous antibiotics and had sequential compression devices applied to her lower extremities.  She was then taken to the operating room where spinal anesthesia was administered in sterile fashion and was found to be adequate. She was then placed in a dorsal supine position with a leftward tilt, and prepped and draped in a sterile manner. A traxxi with 2 extenders was placed to secure the large pendulous pannus.  Photographs of preexisting inflammation and erythema was noted underneath the pannus. A foley catheter was placed into her bladder and attached to constant gravity.  After an adequate timeout was performed, a Pfannenstiel skin incision was made with scalpel about 3 cm above the previous scar .and carried through to the underlying layer of fascia. The fascia was incised in the midline with meticulous sharp dissection.The  incision was extended bilaterally using the Mayo scissors.  Kocher clamps x 2 were applied to the superior aspect of the fascial incision and the underlying rectus muscles were dissected off bluntly and sharply with the scalpel .  A similar process was carried out on the inferior aspect of the fascial incision. There was an abundance of scar tissue once the rectus muscles were separated.  With meticulous sharp dissection the fascial incision was extended superiorly with the scalpel and stretching.  The peritoneum was entered bluntly and the omentum was found to be stuck to the anterior abdominal wall.  The omentum was bluntly dissected until it could be doubly clamped and cut with the bovie cautery.  Each end of the cut omentum was secured using 0 plain gut suture.  Good hemostasis was noted.  The uterus was found to rotated to patient's right.  For better exposure the  rectus muscles were divided  with the bovie cautery.  Moist laparotomy sponges with a tag were placed to help retract bowel. The Alexis self-retaining retractor was introduced into the abdominal cavity.   Attention was turned to the lower uterine segment where a low transverse hysterotomy was made with a scalpel and extended bilaterally bluntly. The amniotic sac was ruptured and clear fluid was noted.   The infant was successfully delivered with the kiwi vacuum.  One pop off was noted. The cord was clamped and cut after one minute, and the infant was handed over to the awaiting neonatology team. Uterine massage was then administered, and the placenta delivered intact with a three-vessel cord. The uterus was then cleared of clots and debris using manual curettage.  The uterine incision was closed with 0 monocryl in a running locked fashion, and an imbricating layer was also placed with 0 monocryl.  Figure-of-eight 0 Vicryl serosal stitches were placed to help with hemostasis.  The pelvis was cleared of all clot and debris with irrigation and suction.  Arista was placed over the uterine incision after the hysterotomy repair.  The incision was watched for at least 90 seconds and there was  good hemostasis and no active bleeding.  The fascia was then closed using 0 Vicryl in a running fashion.  The subcutaneous layer was irrigated, reapproximated with 2-0 plain gut running stitches, and the skin was closed with a 4-0 Vicryl subcuticular stitch. The patient tolerated the procedure well. Sponge, instrument and needle counts were correct x 3.  She was taken to the recovery room  with her blood pressure being monitored closely  as it had remained hypotensive. Of note , the patient was strongly advised to not attempt pregnancy again due to increasing risk for complications and worsening abdominal scarring.   Krystal Shields, MD, [place] for [facility], [place]

## 2022-06-21 NOTE — Anesthesia Procedure Notes (Addendum)
Spinal  Patient location during procedure: OR Start time: 06/21/2022 2:52 PM End time: 06/21/2022 2:58 PM Reason for block: surgical anesthesia Staffing Anesthesiologist: Oleta Mouse, MD Performed by: Oleta Mouse, MD Authorized by: Oleta Mouse, MD   Preanesthetic Checklist Completed: patient identified, IV checked, risks and benefits discussed, surgical consent, monitors and equipment checked, pre-op evaluation and timeout performed Spinal Block Patient position: sitting Prep: DuraPrep Patient monitoring: heart rate, cardiac monitor, continuous pulse ox and blood pressure Approach: midline Location: L3-4 Injection technique: single-shot Needle Needle type: Pencan  Needle gauge: 24 G Needle length: 9 cm Assessment Sensory level: T4 Events: CSF return

## 2022-06-21 NOTE — Addendum Note (Signed)
Addendum  created 06/21/22 1940 by [name], DO   Clinical Note Signed

## 2022-06-21 NOTE — Anesthesia Postprocedure Evaluation (Addendum)
Anesthesia Post Note  Patient: Krystal Hartman  Procedure(s) Performed: CESAREAN SECTION     Patient location during evaluation: PACU Anesthesia Type: Spinal Level of consciousness: awake and alert and oriented Pain management: pain level controlled Vital Signs Assessment: post-procedure vital signs reviewed and stable Respiratory status: spontaneous breathing, nonlabored ventilation and respiratory function stable Cardiovascular status: blood pressure returned to baseline and stable Postop Assessment: no headache, no backache, spinal receding and no apparent nausea or vomiting Anesthetic complications: no Comments: 2 units of blood ordered by primary service and given in PACU, however postop CBC resulted with Hb 9.5 so I advised not to give 2nd unit of blood. Patient hemodynamically stable throughout. I have discharged pt from anesthesia care, however Dr. Ilda Basset would like to keep patient in PACU for multiple more hours to redraw labs and monitor.   No notable events documented.  Last Vitals:  Vitals:   06/21/22 1830 06/21/22 1845  BP: 133/66 134/89  Pulse: 74 91  Resp: 13 19  Temp:    SpO2: 94% 94%    Last Pain:  Vitals:   06/21/22 1845  TempSrc:   PainSc: 6    Pain Goal: Patients Stated Pain Goal: 2 (06/21/22 1730)  LLE Motor Response: Purposeful movement (06/21/22 1845) LLE Sensation: Tingling (06/21/22 1845) RLE Motor Response: Purposeful movement (06/21/22 1845) RLE Sensation: Tingling (06/21/22 1845)     Epidural/Spinal Function Cutaneous sensation: Able to Wiggle Toes (06/21/22 1845), Patient able to flex knees: Yes (06/21/22 1845), Patient able to lift hips off bed: No (06/21/22 1845), Back pain beyond tenderness at insertion site: No (06/21/22 1845), Progressively worsening motor and/or sensory loss: No (06/21/22 1845), Bowel and/or bladder incontinence post epidural: No (06/21/22 1845)  Krystal Hartman

## 2022-06-21 NOTE — Progress Notes (Addendum)
OB Note I went to see patient for some low BPs. She got albumin 55mL x 2 and is getting some phenylephrine for BPs in the 80s/60s. BPs currently in the 100s/60s and HR in the 90s. Nothing on deep fundal and suprapubic massage and nothing coming through the prevena. Patient is mentating fine. Approximatlye 57mL UOP in the foley and 151mL estimated for the case. EBL 1L but there were 30 lap sponges used so likely just a larger EBL than recorded closer to 2L. Will get a stat cbc, inr, ptt and fibrinogen and repeat a cbc at 2100.   If concern for ongoing bleeding and patient stable, then would consult IR. Will crossmatch and hold 2U PRBCs  Durene Romans MD Attending Center for [employer] (Faculty Practice) 06/21/2022 Time: [id number]

## 2022-06-21 NOTE — Discharge Summary (Signed)
Postpartum Discharge Summary   Patient Name: Krystal Hartman DOB: March 27, 1991 MRN: [id number]  Date of admission: 06/21/2022 Delivery date:06/21/2022  Delivering provider: Griffin Basil  Date of discharge: 06/23/2022  Admitting diagnosis: History of cesarean section [Z98.891] Status post cesarean section [Z98.891] Intrauterine pregnancy: [redacted]w[redacted]d    Secondary diagnosis:  Principal Problem:   Status post cesarean section Active Problems:   Morbid obesity (HCC)   Hidradenitis suppurativa   HSV-2 (herpes simplex virus 2) infection   Previous cesarean section complicating pregnancy, antepartum condition or complication   Essential hypertension   Supervision of high risk pregnancy, antepartum   Gastroesophageal reflux disease without esophagitis   S/P laparoscopic sleeve gastrectomy   History of cesarean section   Cesarean delivery delivered   Postpartum hemorrhage   Acute blood loss anemia   Chronic iron deficiency anemia  Additional problems: none    Discharge diagnosis: Term Pregnancy Delivered, CHTN, Anemia, and PPH                                              Post partum procedures:blood transfusion Augmentation: N/A Complications: [id number] Hospital course: Sceduled C/S   32y.o. yo [id number] admitted to the hospital 06/21/2022 for scheduled cesarean section with the following indication: Hx of CS x 4 .Delivery details are as follows:  Membrane Rupture Time/Date: 3:57 PM ,06/21/2022   Delivery Method:C-Section, Vacuum Assisted  Details of operation can be found in separate operative note.  Patient had a postpartum course complicated by acute blood loss anemia 2/2 to post partum hemorrhage. She received 2 units of pRBCs, with improvement in her vitals and symptoms..  She is ambulating, tolerating a regular diet, passing flatus, and urinating well. Patient is discharged home in stable condition on  06/23/22        Newborn Data: Birth date:06/21/2022  Birth  time:3:57 PM  Gender:Female  Living status:Living  Apgars:8 ,9  Weight:2990 g     Magnesium Sulfate received: No BMZ received: No Rhophylac:N/A MMR:N/A T-DaP:Given prenatally Flu: declined Transfusion:Yes - 2 units pRBCs  Physical exam  Vitals:   06/22/22 1107 06/22/22 1421 06/22/22 2030 06/23/22 0555  BP: 129/75 132/78 126/77 (!) 133/90  Pulse: 61 77 76 85  Resp: _0 Temp: 97.9 F (36.6 C) 98.2 F (36.8 C) 97.9 F (36.6 C) 98 F (36.7 C)  TempSrc: Oral Oral Oral   SpO2: 100% 99% 99% 96%  Weight:      Height:       General: alert, cooperative, and no distress Lochia: appropriate Uterine Fundus: firm Incision: Dressing is clean, dry, and intact DVT Evaluation: No evidence of DVT seen on physical exam. Labs: Lab Results  Component Value Date   WBC 8.8 06/22/2022   HGB 9.0 (L) 06/22/2022   HCT 26.1 (L) 06/22/2022   MCV 89.1 06/22/2022   PLT 172 06/22/2022      Latest Ref Rng & Units 06/15/2022    9:39 PM  CMP  Glucose 70 - 99 mg/dL 99   BUN 6 - 20 mg/dL 8   Creatinine 0.44 - 1.00 mg/dL 0.55   Sodium 135 - 145 mmol/L 136   Potassium 3.5 - 5.1 mmol/L 3.3   Chloride 98 - 111 mmol/L 106   CO2 22 - 32 mmol/L 21   Calcium 8.9 - 10.3 mg/dL  8.8   Total Protein 6.5 - 8.1 g/dL 7.3   Total Bilirubin 0.3 - 1.2 mg/dL 0.6   Alkaline Phos 38 - 126 U/L 75   AST 15 - 41 U/L 17   ALT 0 - 44 U/L 12    Edinburgh Score:    06/21/2022   11:20 PM  Edinburgh Postnatal Depression Scale Screening Tool  I have been able to laugh and see the funny side of things. 0  I have looked forward with enjoyment to things. 0  I have blamed myself unnecessarily when things went wrong. 0  I have been anxious or worried for no good reason. 1  I have felt scared or panicky for no good reason. 0  Things have been getting on top of me. 0  I have been so unhappy that I have had difficulty sleeping. 0  I have felt sad or miserable. 0  I have been so unhappy that I have been crying. 0   The thought of harming myself has occurred to me. 0  Edinburgh Postnatal Depression Scale Total 1     After visit meds:  Allergies as of 06/23/2022       Reactions   Latex Hives, Itching   Pork-derived Products Other (See Comments)   Does not eat due to religious reasons.        Medication List     STOP taking these medications    Accu-Chek Guide w/Device Kit   Accu-Chek Softclix Lancets lancets   calcium carbonate 1250 (500 Ca) MG chewable tablet Commonly known as: OS-CAL   glucose blood test strip   valACYclovir 500 MG tablet Commonly known as: Valtrex       TAKE these medications    acetaminophen 500 MG tablet Commonly known as: TYLENOL Take 2 tablets (1,000 mg total) by mouth every 8 (eight) hours.   Blood Pressure Monitoring Devi 1 each by Does not apply route once a week.   cholecalciferol 25 MCG (1000 UNIT) tablet Commonly known as: VITAMIN D3 Take 1,000 Units by mouth daily.   furosemide 20 MG tablet Commonly known as: LASIX Take 1 tablet (20 mg total) by mouth daily for 3 days. Start taking on: June 24, 2022   ibuprofen 600 MG tablet Commonly known as: ADVIL Take 1 tablet (600 mg total) by mouth every 8 (eight) hours.   multivitamin tablet Take 1 tablet by mouth daily.   NIFEdipine 30 MG 24 hr tablet Commonly known as: ADALAT CC Take 1 tablet (30 mg total) by mouth daily. Start taking on: June 24, 2022   nystatin powder Commonly known as: MYCOSTATIN/NYSTOP Apply topically 3 (three) times daily.   oxyCODONE 5 MG immediate release tablet Commonly known as: Oxy IR/ROXICODONE Take 1 tablet (5 mg total) by mouth every 6 (six) hours as needed for moderate pain.         Discharge home in stable condition Infant Feeding: Bottle and Breast Infant Disposition:home with mother Discharge instruction: per After Visit Summary and Postpartum booklet. Activity: Advance as tolerated. Pelvic rest for 6 weeks.  Diet: routine diet Future  Appointments: Future Appointments  Date Time Provider [place]  06/28/2022  3:20 PM [facility] NURSE [facility]  08/02/2022 10:15 AM Woodroe Mode, MD [facility]   Follow up Visit: - scheduled above.  Liliane Channel MD MPH OB Fellow, [place] [facility] [place] 06/23/2022

## 2022-06-21 NOTE — Anesthesia Preprocedure Evaluation (Addendum)
Anesthesia Evaluation  Patient identified by MRN, date of birth, ID band Patient awake    Reviewed: Allergy & Precautions, NPO status , Patient's Chart, lab work & pertinent test results  History of Anesthesia Complications (+) PONV and history of anesthetic complications  Airway Mallampati: III  TM Distance: >3 FB Neck ROM: Full    Dental  (+) Teeth Intact, Dental Advisory Given,    Pulmonary neg shortness of breath, neg sleep apnea, neg COPD, Recent URI , Residual Cough   breath sounds clear to auscultation       Cardiovascular hypertension,  Rhythm:Regular     Neuro/Psych negative neurological ROS  negative psych ROS   GI/Hepatic Neg liver ROS,GERD  ,,  Endo/Other    Morbid obesity  Renal/GU negative Renal ROS     Musculoskeletal   Abdominal   Peds  Hematology  (+) Blood dyscrasia, anemia Lab Results      Component                Value               Date                      WBC                      5.0                 06/20/2022                HGB                      10.6 (L)            06/20/2022                HCT                      31.7 (L)            06/20/2022                MCV                      89.8                06/20/2022                PLT                      198                 06/20/2022          \    Anesthesia Other Findings   Reproductive/Obstetrics (+) Pregnancy                             Anesthesia Physical Anesthesia Plan  ASA: 3  Anesthesia Plan: Spinal   Post-op Pain Management:    Induction:   PONV Risk Score and Plan: 3 and Ondansetron, Dexamethasone and Scopolamine patch - Pre-op  Airway Management Planned: Nasal Cannula, Natural Airway and Simple Face Mask  Additional Equipment: None  Intra-op Plan:   Post-operative Plan:   Informed Consent: I have reviewed the patients History and Physical, chart, labs and discussed the procedure  including the risks, benefits and alternatives for the proposed anesthesia  with the patient or authorized representative who has indicated his/her understanding and acceptance.     Dental advisory given  Plan Discussed with: CRNA  Anesthesia Plan Comments:        Anesthesia Quick Evaluation

## 2022-06-21 NOTE — Progress Notes (Addendum)
OB Note  BPs back to 60s SBP. Will transfuse, f/u labs and take patient back to the [facility] [place] (Faculty Practice) 06/21/2022 Time: 1745   Phenylephrine off and SBP holding in the 90s and patient HR <100. Will transfuse 2U, f/u labs and hold off on taking back to [place] (Faculty Practice) 06/21/2022 Time: 1800

## 2022-06-22 ENCOUNTER — Encounter (HOSPITAL_COMMUNITY): Payer: Self-pay | Admitting: Obstetrics and Gynecology

## 2022-06-22 ENCOUNTER — Encounter: Payer: Medicaid Other | Admitting: Obstetrics & Gynecology

## 2022-06-22 DIAGNOSIS — D509 Iron deficiency anemia, unspecified: Secondary | ICD-10-CM | POA: Insufficient documentation

## 2022-06-22 DIAGNOSIS — D62 Acute posthemorrhagic anemia: Secondary | ICD-10-CM | POA: Insufficient documentation

## 2022-06-22 LAB — CBC
HCT: 26.1 % — ABNORMAL LOW (ref 36.0–46.0)
Hemoglobin: 9 g/dL — ABNORMAL LOW (ref 12.0–15.0)
MCH: 30.7 pg (ref 26.0–34.0)
MCHC: 34.5 g/dL (ref 30.0–36.0)
MCV: 89.1 fL (ref 80.0–100.0)
Platelets: 172 10*3/uL (ref 150–400)
RBC: 2.93 MIL/uL — ABNORMAL LOW (ref 3.87–5.11)
RDW: 14 % (ref 11.5–15.5)
WBC: 8.8 10*3/uL (ref 4.0–10.5)
nRBC: 0 % (ref 0.0–0.2)

## 2022-06-22 MED ORDER — OXYCODONE HCL 5 MG PO TABS
5.0000 mg | ORAL_TABLET | Freq: Once | ORAL | Status: AC
  Administered 2022-06-22: 5 mg via ORAL
  Filled 2022-06-22: qty 1

## 2022-06-22 MED ORDER — LACTATED RINGERS IV BOLUS
500.0000 mL | Freq: Once | INTRAVENOUS | Status: AC
  Administered 2022-06-22: 500 mL via INTRAVENOUS

## 2022-06-22 MED ORDER — FONDAPARINUX SODIUM 2.5 MG/0.5ML ~~LOC~~ SOLN
2.5000 mg | SUBCUTANEOUS | Status: DC
  Administered 2022-06-22 – 2022-06-23 (×2): 2.5 mg via SUBCUTANEOUS
  Filled 2022-06-22 (×2): qty 0.5

## 2022-06-22 MED ORDER — FUROSEMIDE 20 MG PO TABS
20.0000 mg | ORAL_TABLET | Freq: Every day | ORAL | Status: DC
  Administered 2022-06-22 – 2022-06-23 (×2): 20 mg via ORAL
  Filled 2022-06-22 (×2): qty 1

## 2022-06-22 MED ORDER — ENOXAPARIN SODIUM 80 MG/0.8ML IJ SOSY: 80.0000 mg | PREFILLED_SYRINGE | INTRAMUSCULAR | Status: DC

## 2022-06-22 MED ORDER — NYSTATIN 100000 UNIT/GM EX POWD
Freq: Two times a day (BID) | CUTANEOUS | Status: DC
  Filled 2022-06-22: qty 15

## 2022-06-22 MED ORDER — NIFEDIPINE ER OSMOTIC RELEASE 30 MG PO TB24
30.0000 mg | ORAL_TABLET | Freq: Every day | ORAL | Status: DC
  Administered 2022-06-22 – 2022-06-23 (×2): 30 mg via ORAL
  Filled 2022-06-22 (×2): qty 1

## 2022-06-22 NOTE — Social Work (Addendum)
CSW received consult stating "patient has multiple areas of skin breakdown , open ulcers. Concerned about her care after cesarean section and that of baby and other  children in the household. FOB is blind." Consult screened out as medical team as addressed skin concerns. MOB OB records do not indicate improper care of previous csection wounds. MOB has chronic intermittent problem with boils as noted in OB records. Consulted with MOB's RN no other concerns. CSW consult not warranted at this time.   Please contact CSW if concerns arise or by MOB's request.  Trentin Knappenberger, LCSWA Clinical Social Worker [phone] 

## 2022-06-22 NOTE — Lactation Note (Signed)
This note was copied from a baby's chart. Lactation Consultation Note  Patient Name: Krystal Hartman [id number] Date: 06/22/2022 Reason for consult: Initial assessment;Difficult latch;Early term 37-38.6wks;Breastfeeding assistance (0.5% WL; Pregnancy induced hypertension) Age:34 hours  LC entered the room and the infant was asleep in the bassinet.  The birth parent said that the infant has been having issues with latching.  She stated that her nipples are larger and the infant's mouth is too small to accommodate her nipples.  The birth parent remarked that she has been asking to be set up with a DEBP since last night, but no one brought pump parts.  Gramling set up the DEBP, reviewed assembling, disassembling, washing and drying pump parts, and milk storage guidelines.  The birth parent is using a size #27 on setting 4 and stated that the flanges were comfortable. The birth parent is aware to pump every 3 hours.  The birth parent stated that she had no questions or concerns.   Infant Feeding Plan:  Pump every 3 hours and feed expressed milk to the infant via a bottle.  Supplement if needed according to supplementation guidelines.  Call [facility] for assistance with breastfeeding.   Maternal Data Has patient been taught Hand Expression?: Yes Does the patient have breastfeeding experience prior to this delivery?: Yes How long did the patient breastfeed?: Per the birth parent, she attempted to breastfeed all of her children and breast fed 1 of them for 3-4 months  Feeding Nipple Type: Slow - flow  Lactation Tools Discussed/Used Tools: Pump;Flanges Flange Size: 27 Breast pump type: Double-Electric Breast Pump Pump Education: Setup, frequency, and cleaning;Milk Storage Reason for Pumping: Birth parent's request; infant having trouble latching Pumping frequency: every 3 hours  Interventions Interventions: DEBP;Education;LC Services brochure  Discharge Pump: DEBP;Personal  Consult  Status Consult Status: Follow-up Date: 06/23/22 Follow-up type: In-patient    Krystal Hartman 06/22/2022, 8:49 AM

## 2022-06-22 NOTE — Progress Notes (Signed)
POSTPARTUM PROGRESS NOTE  Post Partum Day 1  Subjective:  Krystal Hartman is a 32 y.o. [id number] s/p rTLCS at [redacted]w[redacted]d.  No acute events overnight.  Pt denies problems with ambulating, voiding or po intake.  She denies nausea or vomiting.  Pain is moderately controlled.  She has had flatus. She has not had bowel movement.  Lochia Moderate.   Objective: Blood pressure 129/88, pulse 75, temperature 98.3 F (36.8 C), temperature source Oral, resp. rate 19, height 5\' 4"  (1.626 m), weight (!) 150.6 kg, last menstrual period 10/03/2021, SpO2 98 %, unknown if currently breastfeeding.  Physical Exam:  General: alert, cooperative and no distress Chest: no respiratory distress Heart:regular rate, distal pulses intact Abdomen: soft, nontender,  Uterine Fundus: firm, appropriately tender DVT Evaluation: No calf swelling or tenderness Extremities: No edema Skin: warm, dry  Recent Labs    06/21/22 2104 06/22/22 0520  HGB 9.8* 9.0*  HCT 29.0* 26.1*    Assessment/Plan: Krystal Hartman is a 32 y.o. [id number] s/p rLTCS at [redacted]w[redacted]d   Hg 9.0: Clinically stable, recommend CBC at 6 wk postaprtum visit BP: 120-130's, high of 621 systolic: Procardia and Lasix  PPD#1 - Doing well Contraception: IUD outpatient Feeding: Breast Dispo: Plan for discharge: Home in stable condition.   LOS: 1 day   Fleeta Emmer, Medical Student, CNM 06/22/2022, 8:30 AM

## 2022-06-22 NOTE — H&P (Signed)
Called provider to be notify decrease hemoglobin from 9.8 to 9.0, no new orders at this time.    Eartha Inch RN

## 2022-06-23 ENCOUNTER — Other Ambulatory Visit (HOSPITAL_COMMUNITY): Payer: Self-pay

## 2022-06-23 MED ORDER — IBUPROFEN 600 MG PO TABS
600.0000 mg | ORAL_TABLET | Freq: Three times a day (TID) | ORAL | 0 refills | Status: DC
  Filled 2022-06-23: qty 30, 10d supply, fill #0

## 2022-06-23 MED ORDER — FUROSEMIDE 20 MG PO TABS
20.0000 mg | ORAL_TABLET | Freq: Every day | ORAL | 0 refills | Status: DC
  Filled 2022-06-23: qty 3, 3d supply, fill #0

## 2022-06-23 MED ORDER — ACETAMINOPHEN 500 MG PO TABS
1000.0000 mg | ORAL_TABLET | Freq: Three times a day (TID) | ORAL | 0 refills | Status: DC
  Filled 2022-06-23: qty 60, 10d supply, fill #0

## 2022-06-23 MED ORDER — NYSTATIN 100000 UNIT/GM EX POWD
Freq: Three times a day (TID) | CUTANEOUS | 0 refills | Status: DC
  Filled 2022-06-23: qty 15, 7d supply, fill #0

## 2022-06-23 MED ORDER — OXYCODONE HCL 5 MG PO TABS
5.0000 mg | ORAL_TABLET | Freq: Four times a day (QID) | ORAL | 0 refills | Status: DC | PRN
  Filled 2022-06-23: qty 15, 4d supply, fill #0

## 2022-06-23 MED ORDER — NIFEDIPINE ER 30 MG PO TB24
30.0000 mg | ORAL_TABLET | Freq: Every day | ORAL | 0 refills | Status: DC
  Filled 2022-06-23: qty 30, 30d supply, fill #0

## 2022-06-23 NOTE — Discharge Instructions (Signed)
Follow-up outpatient in 1 week for an incision check and a blood pressure check, and in 4 to 6 weeks, as scheduled for your postpartum visit.    Take Tylenol 1000 mg and ibuprofen 600 mg scheduled times a day for the next few days, to help with your pain.  You can also take oxycodone as needed for breakthrough pain.   Take oral rich food    Please call if you start to experience increased vaginal bleeding requiring a change of pads every 2 hours, sudden onset severe headache, right abdominal pain, flashes of light in a vision or any concerns for new elevated blood pressure.  Take furosemide for 3 more day, but continue nifedipine 30mg  daily until your blood pressure check.

## 2022-06-23 NOTE — Lactation Note (Signed)
This note was copied from a baby's chart. Lactation Consultation Note  Patient Name: Krystal Hartman [id number] Date: 06/23/2022 Reason for consult: Follow-up assessment Age:34 hours   P6: Early term infant at 37+2 weeks Feeding preference: Breast/formula Weight loss: 3%  "Krystal Hartman" was swaddled and asleep when I arrived.  Mother has been breast feeding and supplementing with formula.  Baby has consumed between 20-50 mls/session.  Mother is progressing with her breast feeding and does not need any assistance at this time.  Mother will continue to feed 8-12 times/24 hours or sooner if baby shows cues.  Reminded mother to always latch prior to giving formula supplementation.  Last LATCH score was a 6; baby is voiding/stooling well.  No support person present at this time.  Mother has our op phone number for any questions after discharge.   Maternal Data    Feeding Nipple Type: Slow - flow  LATCH Score                    Lactation Tools Discussed/Used    Interventions Interventions: Education  Discharge Discharge Education: Engorgement and breast care Pump: Personal  Consult Status Consult Status: Complete Date: 06/23/22 Follow-up type: Call as needed    Lanice Schwab Paisleigh Maroney 06/23/2022, 12:27 PM

## 2022-06-24 LAB — BPAM RBC
Blood Product Expiration Date: 202401242359
Blood Product Expiration Date: 202401252359
Blood Product Expiration Date: 202401252359
Blood Product Expiration Date: 202401252359
ISSUE DATE / TIME: 202401031739
ISSUE DATE / TIME: 202401031739
Unit Type and Rh: 6200
Unit Type and Rh: 6200
Unit Type and Rh: 6200
Unit Type and Rh: 6200

## 2022-06-24 LAB — TYPE AND SCREEN
ABO/RH(D): A POS
Antibody Screen: NEGATIVE
Unit division: 0
Unit division: 0
Unit division: 0
Unit division: 0

## 2022-06-26 ENCOUNTER — Other Ambulatory Visit: Payer: Self-pay | Admitting: Advanced Practice Midwife

## 2022-06-26 DIAGNOSIS — Z98891 History of uterine scar from previous surgery: Secondary | ICD-10-CM

## 2022-06-26 MED ORDER — OXYCODONE HCL 5 MG PO TABS: 5.0000 mg | ORAL_TABLET | ORAL | 0 refills | Status: DC | PRN

## 2022-06-26 MED ORDER — GABAPENTIN 300 MG PO CAPS: 300.0000 mg | ORAL_CAPSULE | Freq: Three times a day (TID) | ORAL | 0 refills | Status: DC

## 2022-06-28 ENCOUNTER — Ambulatory Visit (INDEPENDENT_AMBULATORY_CARE_PROVIDER_SITE_OTHER): Payer: Medicaid Other

## 2022-06-28 ENCOUNTER — Ambulatory Visit: Payer: Medicaid Other

## 2022-06-28 VITALS — BP 144/103 | HR 96 | Ht 64.0 in | Wt 327.4 lb

## 2022-06-28 DIAGNOSIS — Z5189 Encounter for other specified aftercare: Secondary | ICD-10-CM

## 2022-06-28 MED ORDER — NIFEDIPINE ER OSMOTIC RELEASE 60 MG PO TB24: 60.0000 mg | ORAL_TABLET | Freq: Every day | ORAL | 2 refills | Status: DC

## 2022-06-28 NOTE — Progress Notes (Signed)
Pt here today for BP check and wound check from rpt c-section x 5 on 06/21/2022.  BP LA 144/103 LA.  Rpt BP RA 156/104 Nifedipine 30 mg daily, last dose in the am.  Pt denies headache and changes in vision. Reviewed chart with Dr. Janus Molder who recommends that pt increase Procardia 30 mg to Procardia 60 mg once daily and to come back on 07/03/22 for rpt blood pressure.  Pt informed of providers recommendation.  Pt scheduled for 07/03/22 @ 1000 for BP check.  Pt verbalized understanding with no further questions.   Frances Nickels

## 2022-07-01 ENCOUNTER — Inpatient Hospital Stay (HOSPITAL_COMMUNITY)
Admission: AD | Admit: 2022-07-01 | Discharge: 2022-07-01 | Disposition: A | Discharge status: Home / Self Care | Payer: Medicaid Other | Attending: Obstetrics and Gynecology | Admitting: Obstetrics and Gynecology

## 2022-07-01 ENCOUNTER — Encounter (HOSPITAL_COMMUNITY): Payer: Self-pay | Admitting: Obstetrics and Gynecology

## 2022-07-01 DIAGNOSIS — Z9884 Bariatric surgery status: Secondary | ICD-10-CM | POA: Diagnosis not present

## 2022-07-01 DIAGNOSIS — O9089 Other complications of the puerperium, not elsewhere classified: Secondary | ICD-10-CM | POA: Insufficient documentation

## 2022-07-01 DIAGNOSIS — I10 Essential (primary) hypertension: Secondary | ICD-10-CM | POA: Insufficient documentation

## 2022-07-01 DIAGNOSIS — R519 Headache, unspecified: Secondary | ICD-10-CM | POA: Insufficient documentation

## 2022-07-01 DIAGNOSIS — Z79899 Other long term (current) drug therapy: Secondary | ICD-10-CM | POA: Insufficient documentation

## 2022-07-01 DIAGNOSIS — Z98891 History of uterine scar from previous surgery: Secondary | ICD-10-CM | POA: Diagnosis not present

## 2022-07-01 LAB — COMPREHENSIVE METABOLIC PANEL
ALT: 14 U/L (ref 0–44)
AST: 17 U/L (ref 15–41)
Albumin: 3.2 g/dL — ABNORMAL LOW (ref 3.5–5.0)
Alkaline Phosphatase: 71 U/L (ref 38–126)
Anion gap: 9 (ref 5–15)
BUN: 12 mg/dL (ref 6–20)
CO2: 26 mmol/L (ref 22–32)
Calcium: 8.8 mg/dL — ABNORMAL LOW (ref 8.9–10.3)
Chloride: 104 mmol/L (ref 98–111)
Creatinine, Ser: 0.85 mg/dL (ref 0.44–1.00)
GFR, Estimated: >60 mL/min (ref >60)
Glucose, Bld: 90 mg/dL (ref 70–99)
Potassium: 3.4 mmol/L — ABNORMAL LOW (ref 3.5–5.1)
Sodium: 139 mmol/L (ref 135–145)
Total Bilirubin: 0.4 mg/dL (ref 0.3–1.2)
Total Protein: 8.2 g/dL — ABNORMAL HIGH (ref 6.5–8.1)

## 2022-07-01 LAB — CBC
HCT: 31.7 % — ABNORMAL LOW (ref 36.0–46.0)
Hemoglobin: 10.3 g/dL — ABNORMAL LOW (ref 12.0–15.0)
MCH: 29.8 pg (ref 26.0–34.0)
MCHC: 32.5 g/dL (ref 30.0–36.0)
MCV: 91.6 fL (ref 80.0–100.0)
Platelets: 382 10*3/uL (ref 150–400)
RBC: 3.46 MIL/uL — ABNORMAL LOW (ref 3.87–5.11)
RDW: 14 % (ref 11.5–15.5)
WBC: 6.8 10*3/uL (ref 4.0–10.5)
nRBC: 0 % (ref 0.0–0.2)

## 2022-07-01 MED ORDER — HYDROCHLOROTHIAZIDE 25 MG PO TABS: 25.0000 mg | ORAL_TABLET | Freq: Every day | ORAL | 1 refills | Status: DC

## 2022-07-01 NOTE — MAU Note (Signed)
.  Krystal Hartman is a 32 y.o. at [redacted]w[redacted]d here in MAU reporting: reports having a headache since yesterday afternoon.  Pt reports increasing her dose of Procardia 30 mg to 60 mg and since the headache started.  Pt states that she took Tylenol and was not effective.   Onset of complaint: yesterday Pain score: 8 Vitals:   07/01/22 1729 07/01/22 1730  Pulse:  (!) 105  Resp:  19  SpO2: 97% 98%      Lab orders placed from triage:    UA

## 2022-07-01 NOTE — MAU Provider Note (Signed)
History     CSN: [id number]  Arrival date and time: 07/01/22 1706   Event Date/Time   First Provider Initiated Contact with Patient 07/01/22 1752      Chief Complaint  Patient presents with   Headache   HPI  Krystal Hartman is a 32 y.o. [id number] postpartum from a repeat c/s on 06/21/2022 who presents for evaluation of a headache. Patient reports the headache started yesterday afternoon after increasing her blood pressure medication. She reports she was able to take the 30mg  of Procardia without an issue but the 60mg  is giving her a headache. Patient rates the pain as a 8/10 and has tried Tylenol for the pain with no relief. 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  She denies any visual changes or epigastric pain. She is pumping and supplementing.   OB History     Gravida  6   Para  5   Term  4   Preterm  1   AB  1   Living  6      SAB  1   IAB  0   Ectopic  0   Multiple  1   Live Births  6           Past Medical History:  Diagnosis Date   Anemia    Boil    on side   Cesarean section wound complication 01/23/5783   Consider wound vac    Hx of preeclampsia, prior pregnancy, currently pregnant 11/21/2018   Hypertension    started on BP med yesterday   MRSA (methicillin resistant Staphylococcus aureus) colonization    chronic intermittent problem with boils-occassionally test positive for MRSA. None present at PAT appt.   PONV (postoperative nausea and vomiting) 2013   pt. states she had a lot of nausea and vomiting after her last c/s  here at [facility]   Pregnancy induced hypertension    Previous cesarean delivery, antepartum condition or complication 11/25/6293   For tertiary C/S, no tubal    Severe preeclampsia superimposed on chronic hypertension, delivered 06/10/2011   [ ]  Aspirin 81 mg daily after 12 weeks Current antihypertensives:  None   Baseline and surveillance labs (pulled in from [facility], refresh links as needed)  Lab  Results Component Value Date  PLT 306 12/02/2018  CREATININE 0.63 12/02/2018  AST 14 12/02/2018  ALT 11 12/02/2018  PROTEIN24HR 156 (H) 05/27/2011   Antenatal Testing CHTN - O10.919  Group I   BP < 140/90, no meds, no preeclampsia, AGA, nml AF    Past Surgical History:  Procedure Laterality Date   CESAREAN SECTION     CESAREAN SECTION  06/22/2011   Procedure: CESAREAN SECTION;  Surgeon: Eldred Manges, MD;  Location: [facility];  Service: Gynecology;  Laterality: N/A;  MRSA positive, Latex Allergy   CESAREAN SECTION N/A 12/11/2012   Procedure: REPEAT CESAREAN SECTION;  Surgeon: Woodroe Mode, MD;  Location: [facility];  Service: Obstetrics;  Laterality: N/A;   CESAREAN SECTION N/A 06/21/2022   Procedure: CESAREAN SECTION;  Surgeon: Griffin Basil, MD;  Location: [facility];  Service: Obstetrics;  Laterality: N/A;   CESAREAN SECTION MULTI-GESTATIONAL N/A 05/06/2019   Procedure: CESAREAN SECTION MULTI-GESTATIONAL;  Surgeon: Osborne Oman, MD;  Location: [facility];  Service: Obstetrics;  Laterality: N/A;   EYE SURGERY     IUD REMOVAL N/A 08/29/2017   Procedure: HYSTEROSCOPY WITH INTRAUTERINE DEVICE (IUD) REMOVAL;  Surgeon: Verita Schneiders  A, MD;  Location: [facility];  Service: Gynecology;  Laterality: N/A;   LAPAROSCOPIC GASTRIC SLEEVE RESECTION  04/2021    Family History  Problem Relation Age of Onset   Asthma Mother    Hypertension Mother    Diabetes Mother    Diabetes Father    Heart disease Father    Colon cancer Father    Diabetes Maternal Aunt    Cancer Maternal Grandmother    Stroke Neg Hx     Social History   Tobacco Use   Smoking status: Never   Smokeless tobacco: Never  Vaping Use   Vaping Use: Never used  Substance Use Topics   Alcohol use: No   Drug use: No    Allergies:  Allergies  Allergen Reactions   Latex Hives and Itching   Pork-Derived Products Other (See Comments)    Does not eat due to religious reasons.    Medications Prior to Admission  Medication  Sig Dispense Refill Last Dose   acetaminophen (TYLENOL) 500 MG tablet Take 2 tablets (1,000 mg total) by mouth every 8 (eight) hours. 60 tablet 0 Past Week   Blood Pressure Monitoring DEVI 1 each by Does not apply route once a week. 1 each 0 Past Week   cholecalciferol (VITAMIN D3) 25 MCG (1000 UNIT) tablet Take 1,000 Units by mouth daily.   07/01/2022   gabapentin (NEURONTIN) 300 MG capsule Take 1 capsule (300 mg total) by mouth 3 (three) times daily. 30 capsule 0 Past Month   Multiple Vitamin (MULTIVITAMIN) tablet Take 1 tablet by mouth daily.   07/01/2022   NIFEdipine (PROCARDIA XL/NIFEDICAL XL) 60 MG 24 hr tablet Take 1 tablet (60 mg total) by mouth daily. 30 tablet 2 07/01/2022   nystatin (MYCOSTATIN/NYSTOP) powder Apply topically 3 (three) times daily. 15 g 0 07/01/2022   oxyCODONE (OXY IR/ROXICODONE) 5 MG immediate release tablet Take 1 tablet (5 mg total) by mouth every 4 (four) hours as needed for moderate pain. 15 tablet 0 Past Week   ibuprofen (ADVIL) 600 MG tablet Take 1 tablet (600 mg total) by mouth every 8 (eight) hours. (Patient not taking: Reported on 07/01/2022) 30 tablet 0 Not Taking   NIFEdipine (ADALAT CC) 30 MG 24 hr tablet Take 1 tablet (30 mg total) by mouth daily. (Patient not taking: Reported on 07/01/2022) 30 tablet 0 Not Taking    Review of Systems  Constitutional: Negative.  Negative for fatigue and fever.  HENT: Negative.    Respiratory: Negative.  Negative for shortness of breath.   Cardiovascular: Negative.  Negative for chest pain.  Gastrointestinal: Negative.  Negative for abdominal pain, constipation, diarrhea, nausea and vomiting.  Genitourinary: Negative.  Negative for dysuria, vaginal bleeding and vaginal discharge.  Neurological:  Positive for headaches. Negative for dizziness.   Physical Exam   Blood pressure (!) 145/90, pulse (!) 106, resp. rate 19, height 5\' 4"  (1.626 m), weight (!) 144.4 kg, last menstrual period 10/03/2021, SpO2 98 %, currently  breastfeeding.  Patient Vitals for the past 24 hrs:  BP Temp src Pulse Resp SpO2 Height Weight  07/01/22 1749 (!) 145/90 -- (!) 106 -- -- -- --  07/01/22 1730 (!) 134/90 Oral (!) 105 19 98 % 5\' 4"  (1.626 m) (!) 144.4 kg  07/01/22 1729 -- -- -- -- 97 % -- --    Physical Exam Vitals and nursing note reviewed.  Constitutional:      General: She is not in acute distress.    Appearance: She is well-developed.  HENT:     Head: Normocephalic.  Eyes:     Pupils: Pupils are equal, round, and reactive to light.  Cardiovascular:     Rate and Rhythm: Normal rate and regular rhythm.     Heart sounds: Normal heart sounds.  Pulmonary:     Effort: Pulmonary effort is normal. No respiratory distress.     Breath sounds: Normal breath sounds.  Abdominal:     General: Bowel sounds are normal. There is no distension.     Palpations: Abdomen is soft.     Tenderness: There is no abdominal tenderness.  Skin:    General: Skin is warm and dry.  Neurological:     Mental Status: She is alert and oriented to person, place, and time.     Motor: No abnormal muscle tone.     Coordination: Coordination normal.     Deep Tendon Reflexes: Reflexes are normal and symmetric. Reflexes normal.  Psychiatric:        Mood and Affect: Mood normal.        Behavior: Behavior normal.        Thought Content: Thought content normal.        Judgment: Judgment normal.      MAU Course  Procedures  Results for orders placed or performed during the hospital encounter of 07/01/22 (from the past 24 hour(s))  CBC     Status: Abnormal   Collection Time: 07/01/22  6:09 PM  Result Value Ref Range   WBC 6.8 4.0 - 10.5 K/uL   RBC 3.46 (L) 3.87 - 5.11 MIL/uL   Hemoglobin 10.3 (L) 12.0 - 15.0 g/dL   HCT 31.7 (L) 36.0 - 46.0 %   MCV 91.6 80.0 - 100.0 fL   MCH 29.8 26.0 - 34.0 pg   MCHC 32.5 30.0 - 36.0 g/dL   RDW 14.0 11.5 - 15.5 %   Platelets 382 150 - 400 K/uL   nRBC 0.0 0.0 - 0.2 %  Comprehensive metabolic panel      Status: Abnormal   Collection Time: 07/01/22  6:09 PM  Result Value Ref Range   Sodium 139 135 - 145 mmol/L   Potassium 3.4 (L) 3.5 - 5.1 mmol/L   Chloride 104 98 - 111 mmol/L   CO2 26 22 - 32 mmol/L   Glucose, Bld 90 70 - 99 mg/dL   BUN 12 6 - 20 mg/dL   Creatinine, Ser 0.85 0.44 - 1.00 mg/dL   Calcium 8.8 (L) 8.9 - 10.3 mg/dL   Total Protein 8.2 (H) 6.5 - 8.1 g/dL   Albumin 3.2 (L) 3.5 - 5.0 g/dL   AST 17 15 - 41 U/L   ALT 14 0 - 44 U/L   Alkaline Phosphatase 71 38 - 126 U/L   Total Bilirubin 0.4 0.3 - 1.2 mg/dL   GFR, Estimated >60 >60 mL/min   Anion gap 9 5 - 15       MDM Labs ordered and reviewed.   CBC, CMP  CNM consulted with Dr. Mikel Cella regarding presentation and results- MD recommends changing medication to HCTZ as that is patient's desire and tolerated well in the past.   Assessment and Plan   1. Postpartum headache   2. Chronic hypertension     -Discharge home in stable condition -Rx for HCTZ sent to pharmacy -HTN precautions discussed -Patient advised to follow-up with [facility] on Monday as scheduled for BP check -Patient may return to MAU as needed or if her condition were to change or  worsen  Wende Mott, CNM 07/01/2022, 5:52 PM  \

## 2022-07-03 ENCOUNTER — Ambulatory Visit (INDEPENDENT_AMBULATORY_CARE_PROVIDER_SITE_OTHER): Payer: Medicaid Other

## 2022-07-03 VITALS — BP 133/89 | HR 97 | Ht 64.0 in | Wt 319.3 lb

## 2022-07-03 DIAGNOSIS — O0990 Supervision of high risk pregnancy, unspecified, unspecified trimester: Secondary | ICD-10-CM

## 2022-07-03 DIAGNOSIS — Z0130 Encounter for examination of blood pressure without abnormal findings: Secondary | ICD-10-CM

## 2022-07-03 DIAGNOSIS — O34219 Maternal care for unspecified type scar from previous cesarean delivery: Secondary | ICD-10-CM

## 2022-07-03 NOTE — Progress Notes (Signed)
Blood Pressure Check Visit  Krystal Hartman is here for blood pressure check following C-section without labor on 06/21/22. Patient seen at MAU for [name] with high blood pressure on 07/01/22; patient discharged with change to blood pressure medication--increased nifedipine to 60 mg q.24 hours. Patient has been taking BP medication as most recently prescribed. BP today is 143/96 and 133/89. Patient denies any dizzness blurred vision headache shortness of breath peripheral edema. Reviewed with Lubertha Sayres, MD; will keep patient on current dose of nifedipine 60mg  q24 hrs per provider.  Patient to return in one week for BP check per provider.    Alinda Money, RN 07/03/2022  10:43 AM

## 2022-07-05 ENCOUNTER — Other Ambulatory Visit: Payer: Self-pay | Admitting: Advanced Practice Midwife

## 2022-07-05 DIAGNOSIS — Z98891 History of uterine scar from previous surgery: Secondary | ICD-10-CM

## 2022-07-06 DIAGNOSIS — O34219 Maternal care for unspecified type scar from previous cesarean delivery: Secondary | ICD-10-CM

## 2022-07-06 DIAGNOSIS — O0990 Supervision of high risk pregnancy, unspecified, unspecified trimester: Secondary | ICD-10-CM

## 2022-07-10 ENCOUNTER — Ambulatory Visit: Payer: Medicaid Other

## 2022-07-18 ENCOUNTER — Ambulatory Visit: Payer: Medicaid Other

## 2022-08-02 ENCOUNTER — Ambulatory Visit: Payer: Self-pay | Admitting: Obstetrics & Gynecology

## 2022-08-10 ENCOUNTER — Ambulatory Visit: Payer: Medicaid Other | Admitting: Advanced Practice Midwife

## 2022-09-05 ENCOUNTER — Ambulatory Visit: Payer: Medicaid Other | Admitting: Advanced Practice Midwife

## 2022-09-05 NOTE — Progress Notes (Deleted)
    [place] Partum Visit Note  Krystal Hartman is a 32 y.o. [id number] female who presents for a postpartum visit. She is 10 weeks postpartum following a repeat cesarean section.  I have fully reviewed the prenatal and intrapartum course. The delivery was at [redacted]w[redacted]d gestational weeks.  Anesthesia: spinal. Postpartum course has been ***. Baby is doing well***. Baby is feeding by {breastmilk/bottle:69}. Bleeding {vag bleed:12292}. Bowel function is {normal:32111}. Bladder function is {normal:32111}. Patient {is/is not:9024} sexually active. Contraception method is {contraceptive method:5051}. Postpartum depression screening: {gen negative/positive:315881}.   The pregnancy intention screening data noted above was reviewed. Potential methods of contraception were discussed. The patient elected to proceed with No data recorded.    Health Maintenance Due  Topic Date Due   COVID-19 Vaccine (3 - 2023-24 season) 02/17/2022    {Common ambulatory SmartLinks:19316}  Review of Systems {ros; complete:30496}  Objective:  LMP 10/03/2021 (Exact Date)    General:  {gen appearance:16600}   Breasts:  {desc; normal/abnormal/not indicated:14647}  Lungs: {lung exam:16931}  Heart:  {heart exam:5510}  Abdomen: {abdomen exam:16834}   Wound {Wound assessment:11097}  GU exam:  {desc; normal/abnormal/not indicated:14647}       Assessment:    There are no diagnoses linked to this encounter.  *** postpartum exam.   Plan:   Essential components of care per ACOG recommendations:  1.  Mood and well being: Patient with {gen negative/positive:315881} depression screening today. Reviewed local resources for support.  - Patient tobacco use? {tobacco use:25506}  - hx of drug use? {yes/no:25505}    2. Infant care and feeding:  -Patient currently breastmilk feeding? {yes/no:25502}  -Social determinants of health (SDOH) reviewed in EPIC. No concerns***The following needs were identified***  3. Sexuality,  contraception and birth spacing - Patient {DOES_DOES NF:2365131 want a pregnancy in the next year.  Desired family size is {NUMBER 1-10:22536} children.  - Reviewed reproductive life planning. Reviewed contraceptive methods based on pt preferences and effectiveness.  Patient desired {Upstream End Methods:24109} today.   - Discussed birth spacing of 18 months  4. Sleep and fatigue -Encouraged family/partner/community support of 4 hrs of uninterrupted sleep to help with mood and fatigue  5. Physical Recovery  - Discussed patients delivery and complications. She describes her labor as {description:25511} - Patient had a {CHL AMB DELIVERY:[phone]}. Patient had a {laceration:25518} laceration. Perineal healing reviewed. Patient expressed understanding - Patient has urinary incontinence? {yes/no:25515} - Patient {ACTION; IS/IS VG:4697475 safe to resume physical and sexual activity  6.  Health Maintenance - HM due items addressed {Yes or If no, why not?:20788} - Last pap smear  Diagnosis  Date Value Ref Range Status  07/19/2020   Final   - Negative for intraepithelial lesion or malignancy (NILM)   Pap smear {done:10129} at today's visit.  -Breast Cancer screening indicated? {indicated:25516}  7. Chronic Disease/Pregnancy Condition follow up: {Follow up:25499}  - PCP follow up  Amado Coe, [place] for [place]

## 2023-06-11 ENCOUNTER — Encounter (HOSPITAL_COMMUNITY): Payer: Self-pay | Admitting: *Deleted

## 2023-06-11 ENCOUNTER — Inpatient Hospital Stay (HOSPITAL_COMMUNITY)
Admission: AD | Admit: 2023-06-11 | Discharge: 2023-06-11 | Disposition: A | Discharge status: Home / Self Care | Payer: Medicaid Other | Attending: Obstetrics & Gynecology | Admitting: Obstetrics & Gynecology

## 2023-06-11 DIAGNOSIS — Z3400 Encounter for supervision of normal first pregnancy, unspecified trimester: Secondary | ICD-10-CM | POA: Diagnosis present

## 2023-06-11 DIAGNOSIS — O26891 Other specified pregnancy related conditions, first trimester: Secondary | ICD-10-CM

## 2023-06-11 DIAGNOSIS — Z3A09 9 weeks gestation of pregnancy: Secondary | ICD-10-CM

## 2023-06-11 DIAGNOSIS — R102 Pelvic and perineal pain: Secondary | ICD-10-CM | POA: Insufficient documentation

## 2023-06-11 DIAGNOSIS — R109 Unspecified abdominal pain: Secondary | ICD-10-CM | POA: Diagnosis not present

## 2023-06-11 DIAGNOSIS — N939 Abnormal uterine and vaginal bleeding, unspecified: Secondary | ICD-10-CM | POA: Diagnosis not present

## 2023-06-11 DIAGNOSIS — O209 Hemorrhage in early pregnancy, unspecified: Secondary | ICD-10-CM | POA: Diagnosis not present

## 2023-06-11 LAB — URINALYSIS, ROUTINE W REFLEX MICROSCOPIC
Bilirubin Urine: NEGATIVE
Glucose, UA: NEGATIVE mg/dL
Ketones, ur: NEGATIVE mg/dL
Leukocytes,Ua: NEGATIVE
Nitrite: NEGATIVE
Protein, ur: NEGATIVE mg/dL
Specific Gravity, Urine: 1.024 (ref 1.005–1.030)
pH: 5 (ref 5.0–8.0)

## 2023-06-11 LAB — WET PREP, GENITAL
Clue Cells Wet Prep HPF POC: NONE SEEN
Sperm: NONE SEEN
Trich, Wet Prep: NONE SEEN
WBC, Wet Prep HPF POC: <10 (ref <10)
Yeast Wet Prep HPF POC: NONE SEEN

## 2023-06-11 LAB — HCG, QUANTITATIVE, PREGNANCY: hCG, Beta Chain, Quant, S: 42 m[IU]/mL — ABNORMAL HIGH (ref <5)

## 2023-06-11 LAB — POCT PREGNANCY, URINE: Preg Test, Ur: POSITIVE — AB

## 2023-06-11 NOTE — MAU Provider Note (Signed)
History     CSN: [id number]  Arrival date and time: 06/11/23 1723   Event Date/Time   First Provider Initiated Contact with Patient 06/11/23 1945      Chief Complaint  Patient presents with   Vaginal Bleeding   Abdominal Pain   Possible Pregnancy   Krystal Hartman , a  32 y.o. [id number] at [redacted]w[redacted]d presents to MAU with complaints of vaginal bleeding and cramping that started yesterday afternoon. She reports that she noted some bleeding with urination yesterday 1 time but denied pain. She states that she did not have anymore bleeding throughout the night or this morning. She states that after doing her daughters hair this evening she noted a "small amount" of bright red bleeding where she had been sitting. She states that she is wearing period panties but denies bleeding heavily, or passing clots. She does endorse some lower abdominal cramping but denies attempting to relieve symptoms and states since arrival to MAU she has not noticed much more bleeding.        OB History     Gravida  7   Para  5   Term  4   Preterm  1   AB  1   Living  6      SAB  1   IAB  0   Ectopic  0   Multiple  1   Live Births  6           Past Medical History:  Diagnosis Date   Anemia    Boil    on side   Cesarean section wound complication 06/25/2011   Consider wound vac    Hx of preeclampsia, prior pregnancy, currently pregnant 11/21/2018   Hypertension    started on BP med yesterday   MRSA (methicillin resistant Staphylococcus aureus) colonization    chronic intermittent problem with boils-occassionally test positive for MRSA. None present at PAT appt.   PONV (postoperative nausea and vomiting) 2013   pt. states she had a lot of nausea and vomiting after her last c/s  here at [facility]   Pregnancy induced hypertension    Previous cesarean delivery, antepartum condition or complication 08/19/2012   For tertiary C/S, no tubal    Severe preeclampsia superimposed on chronic  hypertension, delivered 06/10/2011   [ ]  Aspirin 81 mg daily after 12 weeks Current antihypertensives:  None   Baseline and surveillance labs (pulled in from [facility], refresh links as needed)  Lab Results Component Value Date  PLT 306 12/02/2018  CREATININE 0.63 12/02/2018  AST 14 12/02/2018  ALT 11 12/02/2018  PROTEIN24HR 156 (H) 05/27/2011   Antenatal Testing CHTN - O10.919  Group I   BP < 140/90, no meds, no preeclampsia, AGA, nml AF    Past Surgical History:  Procedure Laterality Date   CESAREAN SECTION     CESAREAN SECTION  06/22/2011   Procedure: CESAREAN SECTION;  Surgeon: Hal Morales, MD;  Location: [facility];  Service: Gynecology;  Laterality: N/A;  MRSA positive, Latex Allergy   CESAREAN SECTION N/A 12/11/2012   Procedure: REPEAT CESAREAN SECTION;  Surgeon: Adam Phenix, MD;  Location: [facility];  Service: Obstetrics;  Laterality: N/A;   CESAREAN SECTION N/A 06/21/2022   Procedure: CESAREAN SECTION;  Surgeon: Warden Fillers, MD;  Location: [facility];  Service: Obstetrics;  Laterality: N/A;   CESAREAN SECTION MULTI-GESTATIONAL N/A 05/06/2019   Procedure: CESAREAN SECTION MULTI-GESTATIONAL;  Surgeon: Tereso Newcomer, MD;  Location: [facility];  Service: Obstetrics;  Laterality: N/A;   EYE SURGERY     IUD REMOVAL N/A 08/29/2017   Procedure: HYSTEROSCOPY WITH INTRAUTERINE DEVICE (IUD) REMOVAL;  Surgeon: Tereso Newcomer, MD;  Location: [facility];  Service: Gynecology;  Laterality: N/A;   LAPAROSCOPIC GASTRIC SLEEVE RESECTION  04/2021    Family History  Problem Relation Age of Onset   Asthma Mother    Hypertension Mother    Diabetes Mother    Diabetes Father    Heart disease Father    Colon cancer Father    Diabetes Maternal Aunt    Cancer Maternal Grandmother    Stroke Neg Hx     Social History   Tobacco Use   Smoking status: Never   Smokeless tobacco: Never  Vaping Use   Vaping status: Never Used  Substance Use Topics   Alcohol use: No   Drug use: No    Allergies:   Allergies  Allergen Reactions   Latex Hives and Itching   Pork-Derived Products Other (See Comments)    Does not eat due to religious reasons.    Medications Prior to Admission  Medication Sig Dispense Refill Last Dose/Taking   acetaminophen (TYLENOL) 500 MG tablet Take 2 tablets (1,000 mg total) by mouth every 8 (eight) hours. (Patient not taking: Reported on 07/03/2022) 60 tablet 0    Blood Pressure Monitoring DEVI 1 each by Does not apply route once a week. 1 each 0    cholecalciferol (VITAMIN D3) 25 MCG (1000 UNIT) tablet Take 1,000 Units by mouth daily.      gabapentin (NEURONTIN) 300 MG capsule Take 1 capsule (300 mg total) by mouth 3 (three) times daily. 30 capsule 0    hydrochlorothiazide (HYDRODIURIL) 25 MG tablet Take 1 tablet (25 mg total) by mouth daily. 30 tablet 1    Multiple Vitamin (MULTIVITAMIN) tablet Take 1 tablet by mouth daily.      NIFEdipine (ADALAT CC) 30 MG 24 hr tablet Take 1 tablet (30 mg total) by mouth daily. (Patient not taking: Reported on 07/01/2022) 30 tablet 0    NIFEdipine (PROCARDIA XL/NIFEDICAL XL) 60 MG 24 hr tablet Take 1 tablet (60 mg total) by mouth daily. 30 tablet 2    nystatin (MYCOSTATIN/NYSTOP) powder Apply topically 3 (three) times daily. 15 g 0    oxyCODONE (OXY IR/ROXICODONE) 5 MG immediate release tablet Take 1 tablet (5 mg total) by mouth every 4 (four) hours as needed for moderate pain. (Patient not taking: Reported on 07/03/2022) 15 tablet 0     Review of Systems  Constitutional:  Negative for chills, fatigue and fever.  Eyes:  Negative for pain and visual disturbance.  Respiratory:  Negative for apnea, shortness of breath and wheezing.   Cardiovascular:  Negative for chest pain and palpitations.  Gastrointestinal:  Positive for abdominal pain. Negative for constipation, diarrhea, nausea and vomiting.  Genitourinary:  Positive for pelvic pain and vaginal bleeding. Negative for difficulty urinating, dysuria, vaginal discharge and vaginal  pain.  Musculoskeletal:  Negative for back pain.  Neurological:  Negative for seizures, weakness and headaches.  Psychiatric/Behavioral:  Negative for suicidal ideas.    Physical Exam   Blood pressure (!) 158/97, pulse 64, temperature 98 F (36.7 C), temperature source Oral, resp. rate 17, height 5\' 4"  (1.626 m), weight (!) 171.1 kg, last menstrual period 04/08/2023, SpO2 100%, currently breastfeeding.  Physical Exam Vitals and nursing note reviewed.  Constitutional:      General: She is not in acute distress.  Appearance: Normal appearance.  HENT:     Head: Normocephalic.  Pulmonary:     Effort: Pulmonary effort is normal.  Musculoskeletal:     Cervical back: Normal range of motion.  Skin:    General: Skin is warm and dry.  Neurological:     Mental Status: She is alert and oriented to person, place, and time.  Psychiatric:        Mood and Affect: Mood normal.     MAU Course  Procedures  Lab Orders         Wet prep, genital         hCG, quantitative, pregnancy         Urinalysis, Routine w reflex microscopic -Urine, Clean Catch         Pregnancy, urine POC    Results for orders placed or performed during the hospital encounter of 06/11/23 (from the past 24 hours)  Pregnancy, urine POC     Status: Abnormal   Collection Time: 06/11/23  6:44 PM  Result Value Ref Range   Preg Test, Ur POSITIVE (A) NEGATIVE  Wet prep, genital     Status: None   Collection Time: 06/11/23  6:51 PM  Result Value Ref Range   Yeast Wet Prep HPF POC NONE SEEN NONE SEEN   Trich, Wet Prep NONE SEEN NONE SEEN   Clue Cells Wet Prep HPF POC NONE SEEN NONE SEEN   WBC, Wet Prep HPF POC <10 <10   Sperm NONE SEEN   Urinalysis, Routine w reflex microscopic -Urine, Clean Catch     Status: Abnormal   Collection Time: 06/11/23  6:51 PM  Result Value Ref Range   Color, Urine YELLOW YELLOW   APPearance HAZY (A) CLEAR   Specific Gravity, Urine 1.024 1.005 - 1.030   pH 5.0 5.0 - 8.0   Glucose, UA  NEGATIVE NEGATIVE mg/dL   Hgb urine dipstick LARGE (A) NEGATIVE   Bilirubin Urine NEGATIVE NEGATIVE   Ketones, ur NEGATIVE NEGATIVE mg/dL   Protein, ur NEGATIVE NEGATIVE mg/dL   Nitrite NEGATIVE NEGATIVE   Leukocytes,Ua NEGATIVE NEGATIVE   RBC / HPF 6-10 0 - 5 RBC/hpf   WBC, UA 0-5 0 - 5 WBC/hpf   Bacteria, UA RARE (A) NONE SEEN   Squamous Epithelial / HPF 6-10 0 - 5 /HPF   Mucus PRESENT   hCG, quantitative, pregnancy     Status: Abnormal   Collection Time: 06/11/23  7:04 PM  Result Value Ref Range   hCG, Beta Chain, Quant, S 42 (H) <5 mIU/mL    MDM - Very faint line noted on UPT in triage.  - Offered Patient he option of a Quant, and vaginal swabs and patient agreeable to plan of care.  - Quant results 42  - Wet prep normal  - UA reflexed to culture  - plan for discharge.   Assessment and Plan   1. Vaginal bleeding   2. [redacted] weeks gestation of pregnancy   3. Abdominal cramping    - Recommendation to return in 48 hours for repeat qaunt. Patient verbalized understanding.  - Discussed option of pregnancy caught very early versus miscarriage.  - Reviewed worsening signs and return precautions.  - Patient to return to MAU on Wednesday for repeat labs.  - Patient discharged  home in stable condition and may return to MAU as needed.   Claudette Head, MSN CNM  06/11/2023, 7:46 PM

## 2023-06-11 NOTE — MAU Note (Signed)
Krystal Hartman is a 32 y.o. at Unknown here in MAU reporting: noted a little bit of bleeding. yesterday when she used the bathroom.  None this morning.  Later, noted a blood spot on the bed where she was sitting. , has not seen anything.  Has had a little bit of cramping. Thinks she is preg, no period since Oct.  Has not done a home test or had one elsewhere.  LMP: 10/20 Onset of complaint: yesterday Pain score: mild Vitals:   06/11/23 1822  BP: (!) 158/97  Pulse: 64  Resp: 17  Temp: 98 F (36.7 C)  SpO2: 100%      Lab orders placed from triage: UPT

## 2023-06-12 LAB — GC/CHLAMYDIA PROBE AMP (~~LOC~~) NOT AT ARMC
Chlamydia: NEGATIVE
Comment: NEGATIVE
Comment: NORMAL
Neisseria Gonorrhea: NEGATIVE

## 2023-06-13 ENCOUNTER — Inpatient Hospital Stay (HOSPITAL_COMMUNITY)
Admission: AD | Admit: 2023-06-13 | Discharge: 2023-06-13 | Disposition: A | Discharge status: Home / Self Care | Payer: Medicaid Other | Attending: Obstetrics and Gynecology | Admitting: Obstetrics and Gynecology

## 2023-06-13 ENCOUNTER — Inpatient Hospital Stay (HOSPITAL_COMMUNITY): Admit: 2023-06-13 | Discharge: 2023-06-13 | Discharge status: Home / Self Care | Payer: Medicaid Other

## 2023-06-13 ENCOUNTER — Other Ambulatory Visit: Payer: Self-pay | Admitting: Family Medicine

## 2023-06-13 DIAGNOSIS — O3680X0 Pregnancy with inconclusive fetal viability, not applicable or unspecified: Secondary | ICD-10-CM

## 2023-06-13 DIAGNOSIS — O10911 Unspecified pre-existing hypertension complicating pregnancy, first trimester: Secondary | ICD-10-CM | POA: Insufficient documentation

## 2023-06-13 DIAGNOSIS — Z3A09 9 weeks gestation of pregnancy: Secondary | ICD-10-CM | POA: Insufficient documentation

## 2023-06-13 LAB — CULTURE, OB URINE: Culture: 20000 — AB

## 2023-06-13 LAB — HCG, QUANTITATIVE, PREGNANCY: hCG, Beta Chain, Quant, S: 79 m[IU]/mL — ABNORMAL HIGH (ref <5)

## 2023-06-13 MED ORDER — NIFEDIPINE ER OSMOTIC RELEASE 30 MG PO TB24: 30.0000 mg | ORAL_TABLET | Freq: Every day | ORAL | Status: DC

## 2023-06-13 MED ORDER — PREPLUS 27-1 MG PO TABS: 1.0000 | ORAL_TABLET | Freq: Every day | ORAL | 13 refills | Status: AC

## 2023-06-13 MED ORDER — NIFEDIPINE ER 30 MG PO TB24: 30.0000 mg | ORAL_TABLET | Freq: Every day | ORAL | 1 refills | Status: DC

## 2023-06-13 NOTE — Discharge Instructions (Signed)
Viability Ultrasound at [facility] for [facility] 06/27/23 @9 :45    [place] [facility] [employer] for [employer] at [employer] [facility]             [address], [place], [place] [address] [phone]  [facility] for [employer] at [facility]                                                             [address], [place], [place], [address] [phone]  [facility] for [facility] [address], [place], [place], [address] [phone]  Center for [facility] at [facility] [address], [place], [place], [address] [phone]  Center for [facility] at [facility]                                 [address] [place], [place], [place], [address] [phone]  Center for [facility] at [facility]                                    [address], [place], [place], [address] [phone]  Center for [facility] Healthcare at [facility] [address], [place], [place], [address]                              [facility] of  [address], [address], [place], [place], [address] [phone]  [facility]         Phone: [phone]  [facility]      Phone: [phone]   [facility]      Phone: [phone]  [facility] Health Department-Family Planning         Phone: [phone]   [facility] Health Department-Maternity    Phone: [phone]  [facility]      Phone: [phone]  [facility] [place]     Phone: [phone]  [facility]        Phone: [phone]  [facility] [employer]) [phone]  [facility]      Phone: [phone]   Safe Medications in Pregnancy   Acne:  Benzoyl Peroxide  Salicylic Acid   Backache/Headache:  Tylenol:  2 regular strength every 4 hours OR               2 Extra strength every 6 hours   Colds/Coughs/Allergies:  Benadryl (alcohol free) 25 mg every 6 hours as needed  Breath right strips  Claritin  Cepacol throat lozenges  Chloraseptic throat spray  Cold-Eeze- up to three times per day  Cough drops, alcohol free  Flonase (by prescription only)  Guaifenesin  Mucinex  Robitussin DM (plain only, alcohol free)  Saline nasal spray/drops  Sudafed (pseudoephedrine) & Actifed * use only after [redacted] weeks gestation and if you do  not have high blood pressure  Tylenol  Vicks Vaporub  Zinc lozenges  Zyrtec   Constipation:  Colace  Ducolax suppositories  Fleet enema  Glycerin suppositories  Metamucil  Milk of magnesia  Miralax  Senokot  Smooth move tea   Diarrhea:  Kaopectate  Imodium A-D   *NO pepto Bismol   Hemorrhoids:  Anusol  Anusol HC  Preparation H  Tucks   Indigestion:  Tums  Maalox  Mylanta  Zantac  Pepcid   Insomnia:  Benadryl (alcohol free) 25mg  every 6 hours as needed  Tylenol PM  Unisom, no Gelcaps   Leg Cramps:  Tums  MagGel   Nausea/Vomiting:  Bonine  Dramamine  Emetrol  Ginger extract  Sea bands  Meclizine  Nausea medication to take during pregnancy:  Unisom (doxylamine succinate 25 mg tablets) Take one tablet daily at bedtime. If symptoms are not adequately controlled, the dose can be increased to a maximum recommended dose of two tablets daily (1/2 tablet in the morning, 1/2 tablet mid-afternoon and one at bedtime).  Vitamin B6 100mg  tablets. Take one tablet twice a day (up to 200 mg per day).   Skin Rashes:  Aveeno products  Benadryl cream or 25mg  every 6 hours as needed  Calamine Lotion  1% cortisone cream   Yeast infection:  Gyne-lotrimin 7  Monistat 7    **If taking multiple medications, please check labels to avoid duplicating the same active ingredients  **take medication as directed on the label  ** Do not exceed 4000 mg of  tylenol in 24 hours  **Do not take medications that contain aspirin or ibuprofen

## 2023-06-13 NOTE — MAU Provider Note (Addendum)
MAU hCG f/u    S [name] is a 32 y.o. [phone] pregnant female at [redacted]w[redacted]d who presents to MAU today for bHCG check. Seen 12/23 for VB and faint line noted on UPT in triage at that time.  Quant was only 42 on 12/23.  Plan at that time was to follow up today for hCG level check.  Today is not having any new pain.  Bleeding has continued, like her normal menses, changes pad 3-4 times a day though never full.  She states it like smears on the pad.    Pertinent items noted in HPI and remainder of comprehensive ROS otherwise negative.   O BP (!) 148/91 (BP Location: Right Arm)   Pulse 79   Temp 98.3 F (36.8 C) (Oral)   Resp 16   Ht 5\' 4"  (1.626 m)   Wt (!) 171.1 kg   LMP 04/08/2023   SpO2 100%   BMI 64.76 kg/m  Physical Exam Vitals and nursing note reviewed.  Constitutional:      General: She is not in acute distress.    Appearance: She is obese. She is not ill-appearing.  HENT:     Head: Normocephalic and atraumatic.     Right Ear: External ear normal.     Left Ear: External ear normal.     Nose: Nose normal.     Mouth/Throat:     Mouth: Mucous membranes are moist.     Pharynx: Oropharynx is clear.  Eyes:     Extraocular Movements: Extraocular movements intact.     Conjunctiva/sclera: Conjunctivae normal.  Cardiovascular:     Rate and Rhythm: Normal rate.  Pulmonary:     Effort: Pulmonary effort is normal. No respiratory distress.  Musculoskeletal:        General: Normal range of motion.     Cervical back: Normal range of motion.  Skin:    General: Skin is warm and dry.  Neurological:     Mental Status: She is alert and oriented to person, place, and time. Mental status is at baseline.  Psychiatric:        Mood and Affect: Mood normal.        Behavior: Behavior normal.      MDM: MAU Course:  Pt got hCG collected today.  However, patient doesn't want to wait on results or [place] given having her teenage son with her.  She understands the risk of not finishing  full evaluation however given stable presentation today cleared with Dr. Charlotta Newton.  Pt is stable for d/c today with strict return precautions regarding ectopic / miscarriage.  Scheduled viability [place] for 06/27/23 @ 945 at [facility].  Verified access to MyChart for follow up of hCG results. Started back on Procardia 30mg  XL given elevated BP likely chronic HTN.    A&P: #[redacted] weeks gestation #Pregnancy of unknown location - f/u hCG - scheduled viability [place] 06/27/23 #Chronic HTN- starting on Procardia XL30mg  daily  Discharge from MAU in stable condition with strict/usual precautions Follow up at [facility] as scheduled for ongoing prenatal care  Allergies as of 06/13/2023       Reactions   Latex Hives, Itching   Pork-derived Products Other (See Comments)   Does not eat due to religious reasons.        Medication List     STOP taking these medications    Acetaminophen Extra Strength 500 MG Tabs   Blood Pressure Monitoring Devi   cholecalciferol 25 MCG (1000 UNIT) tablet Commonly  known as: VITAMIN D3   gabapentin 300 MG capsule Commonly known as: Neurontin   hydrochlorothiazide 25 MG tablet Commonly known as: HYDRODIURIL   multivitamin tablet   nystatin powder Generic drug: nystatin   oxyCODONE 5 MG immediate release tablet Commonly known as: Oxy IR/ROXICODONE       TAKE these medications    NIFEdipine 30 MG 24 hr tablet Commonly known as: ADALAT CC Take 1 tablet (30 mg total) by mouth daily. What changed: Another medication with the same name was removed. Continue taking this medication, and follow the directions you see here.   PrePLUS 27-1 MG Tabs Take 1 tablet by mouth daily.        Hessie Dibble, MD 06/13/2023 9:12 PM

## 2023-06-13 NOTE — MAU Note (Signed)
..  Krystal Hartman is a 32 y.o. at [redacted]w[redacted]d here in MAU reporting: here for repeat blood work. Is not having new pain and the bleeding is still like it was before. (Like a period, changes her pad 3-4 times a day and it is never full.   Pain score: 0/10 Vitals:   06/13/23 2002  BP: (!) 148/91  Pulse: 79  Resp: 16  Temp: 98.3 F (36.8 C)  SpO2: 100%      Lab orders placed from triage:  HCG

## 2023-06-27 ENCOUNTER — Other Ambulatory Visit: Payer: Medicaid Other

## 2023-06-27 ENCOUNTER — Inpatient Hospital Stay (HOSPITAL_COMMUNITY)
Admission: AD | Admit: 2023-06-27 | Discharge: 2023-06-27 | Disposition: A | Discharge status: Home / Self Care | Payer: Medicaid Other | Attending: Obstetrics and Gynecology | Admitting: Obstetrics and Gynecology

## 2023-06-27 ENCOUNTER — Inpatient Hospital Stay (HOSPITAL_COMMUNITY): Payer: Medicaid Other

## 2023-06-27 ENCOUNTER — Other Ambulatory Visit: Payer: Self-pay

## 2023-06-27 DIAGNOSIS — O3680X0 Pregnancy with inconclusive fetal viability, not applicable or unspecified: Secondary | ICD-10-CM | POA: Diagnosis not present

## 2023-06-27 DIAGNOSIS — O10911 Unspecified pre-existing hypertension complicating pregnancy, first trimester: Secondary | ICD-10-CM | POA: Insufficient documentation

## 2023-06-27 DIAGNOSIS — O26891 Other specified pregnancy related conditions, first trimester: Secondary | ICD-10-CM | POA: Diagnosis not present

## 2023-06-27 DIAGNOSIS — O209 Hemorrhage in early pregnancy, unspecified: Secondary | ICD-10-CM

## 2023-06-27 DIAGNOSIS — Z3A11 11 weeks gestation of pregnancy: Secondary | ICD-10-CM | POA: Diagnosis not present

## 2023-06-27 LAB — HCG, QUANTITATIVE, PREGNANCY: hCG, Beta Chain, Quant, S: 511 m[IU]/mL — ABNORMAL HIGH (ref <5)

## 2023-06-27 LAB — COMPREHENSIVE METABOLIC PANEL
ALT: 13 U/L (ref 0–44)
AST: 17 U/L (ref 15–41)
Albumin: 3.5 g/dL (ref 3.5–5.0)
Alkaline Phosphatase: 54 U/L (ref 38–126)
Anion gap: 9 (ref 5–15)
BUN: 10 mg/dL (ref 6–20)
CO2: 23 mmol/L (ref 22–32)
Calcium: 8.7 mg/dL — ABNORMAL LOW (ref 8.9–10.3)
Chloride: 108 mmol/L (ref 98–111)
Creatinine, Ser: 0.79 mg/dL (ref 0.44–1.00)
GFR, Estimated: >60 mL/min (ref >60)
Glucose, Bld: 112 mg/dL — ABNORMAL HIGH (ref 70–99)
Potassium: 3.8 mmol/L (ref 3.5–5.1)
Sodium: 140 mmol/L (ref 135–145)
Total Bilirubin: 0.4 mg/dL (ref 0.0–1.2)
Total Protein: 7.3 g/dL (ref 6.5–8.1)

## 2023-06-27 LAB — CBC
HCT: 32.2 % — ABNORMAL LOW (ref 36.0–46.0)
Hemoglobin: 10.2 g/dL — ABNORMAL LOW (ref 12.0–15.0)
MCH: 27.8 pg (ref 26.0–34.0)
MCHC: 31.7 g/dL (ref 30.0–36.0)
MCV: 87.7 fL (ref 80.0–100.0)
Platelets: 246 10*3/uL (ref 150–400)
RBC: 3.67 MIL/uL — ABNORMAL LOW (ref 3.87–5.11)
RDW: 15.6 % — ABNORMAL HIGH (ref 11.5–15.5)
WBC: 6 10*3/uL (ref 4.0–10.5)
nRBC: 0 % (ref 0.0–0.2)

## 2023-06-27 NOTE — MAU Provider Note (Signed)
 Chief Complaint: Vaginal Bleeding   Event Date/Time   First Provider Initiated Contact with Patient 06/27/23 2157        SUBJECTIVE HPI: Krystal Hartman is a 33 y.o. [id number] at [redacted]w[redacted]d by LMP who presents to maternity admissions reporting vaginal bleeding that has been getting heavier.  Was seen on Christmas for bleeding and HCG had risen from 42 to 77.  . She denies urinary symptoms, dizziness, n/v, or fever/chills.    Vaginal Bleeding The patient's primary symptoms include vaginal bleeding. The patient's pertinent negatives include no pelvic pain. This is a recurrent problem. The current episode started 1 to 4 weeks ago. The patient is experiencing no pain. She is pregnant. Pertinent negatives include no abdominal pain or back pain. The vaginal discharge was bloody. She has not been passing clots. She has not been passing tissue.   Has a new OB appt January 28 at [facility] in [place]  Has not been seen by them yet  Had US  scheduled today but office cancelled it and moved it up a day then cancelled second one   RN Note: Krystal Hartman is a 33 y.o. at [redacted]w[redacted]d here in MAU reporting: vaginal bleeding is getting worse, changes pad 5-6 times a day, but it is never saturated.  Denies pain.  Reports that her ultrasound from today got rescheduled.  Has procardia  but did not take it today because it gives her a headache.  Pain score: 0/10  Past Medical History:  Diagnosis Date   Anemia    Boil    on side   Cesarean section wound complication 06/25/2011   Consider wound vac    Hx of preeclampsia, prior pregnancy, currently pregnant 11/21/2018   Hypertension    started on BP med yesterday   MRSA (methicillin resistant Staphylococcus aureus) colonization    chronic intermittent problem with boils-occassionally test positive for MRSA. None present at PAT appt.   PONV (postoperative nausea and vomiting) 2013   pt. states she had a lot of nausea and vomiting after her last c/s  here at [facility]   Pregnancy induced hypertension    Previous cesarean delivery, antepartum condition or complication 08/19/2012   For tertiary C/S, no tubal    Severe preeclampsia superimposed on chronic hypertension, delivered 06/10/2011   [ ]  Aspirin  81 mg daily after 12 weeks Current antihypertensives:  None   Baseline and surveillance labs (pulled in from [facility], refresh links as needed)  Lab Results Component Value Date  PLT 306 12/02/2018  CREATININE 0.63 12/02/2018  AST 14 12/02/2018  ALT 11 12/02/2018  PROTEIN24HR 156 (H) 05/27/2011   Antenatal Testing CHTN - O10.919  Group I   BP < 140/90, no meds, no preeclampsia, AGA, nml AF   Past Surgical History:  Procedure Laterality Date   CESAREAN SECTION     CESAREAN SECTION  06/22/2011   Procedure: CESAREAN SECTION;  Surgeon: Shanda SHAUNNA Muscat, MD;  Location: [facility]S;  Service: Gynecology;  Laterality: N/A;  MRSA positive, Latex Allergy   CESAREAN SECTION N/A 12/11/2012   Procedure: REPEAT CESAREAN SECTION;  Surgeon: Lynwood KANDICE Solomons, MD;  Location: [facility] ORS;  Service: Obstetrics;  Laterality: N/A;   CESAREAN SECTION N/A 06/21/2022   Procedure: CESAREAN SECTION;  Surgeon: Zina Jerilynn LABOR, MD;  Location: [facility] LD ORS;  Service: Obstetrics;  Laterality: N/A;   CESAREAN SECTION MULTI-GESTATIONAL N/A 05/06/2019   Procedure: CESAREAN SECTION MULTI-GESTATIONAL;  Surgeon: Herchel Gloris LABOR, MD;  Location: [facility] LD ORS;  Service: Obstetrics;  Laterality: N/A;   EYE SURGERY     IUD REMOVAL N/A 08/29/2017   Procedure: HYSTEROSCOPY WITH INTRAUTERINE DEVICE (IUD) REMOVAL;  Surgeon: Herchel Gloris LABOR, MD;  Location: [facility]S;  Service: Gynecology;  Laterality: N/A;   LAPAROSCOPIC GASTRIC SLEEVE RESECTION  04/2021   Social History   Socioeconomic History   Marital status: Married    Spouse name: Not on file   Number of children: Not on file   Years of education: Not on file   Highest education level: Not on file  Occupational History   Not on file  Tobacco Use    Smoking status: Never   Smokeless tobacco: Never  Vaping Use   Vaping status: Never Used  Substance and Sexual Activity   Alcohol use: No   Drug use: No   Sexual activity: Not Currently    Birth control/protection: None  Other Topics Concern   Not on file  Social History Narrative   Not on file   Social Drivers of Health   Financial Resource Strain: Low Risk  (07/27/2022)   Received from [facility]   Overall Financial Resource Strain (CARDIA)    Difficulty of Paying Living Expenses: Not hard at all  Food Insecurity: No Food Insecurity (07/27/2022)   [facility] from [facility]   Hunger Vital Sign    Worried About Running Out of Food in the Last Year: Never true    Ran Out of Food in the Last Year: Never true  Transportation Needs: No Transportation Needs (07/27/2022)   Received from [facility]   PRAPARE - Transportation    Lack of Transportation (Medical): No    Lack of Transportation (Non-Medical): No  Physical Activity: Insufficiently Active (01/28/2021)   Received from [facility]   Exercise Vital Sign    Days of Exercise per Week: 1 day    Minutes of Exercise per Session: 20 min  Stress: No Stress Concern Present (03/29/2021)   Received from [facility] of [facility] - Occupational Stress Questionnaire    Feeling of Stress : Not at all  Social Connections: Unknown (10/17/2021)   [facility] from [facility]   Social Network    Social Network: Not on file  Intimate Partner Violence: Not At Risk (06/21/2022)   Humiliation, Afraid, Rape, and Kick questionnaire    Fear of Current or Ex-Partner: No    Emotionally Abused: No    Physically Abused: No    Sexually Abused: No   No current facility-administered medications on file prior to encounter.   Current Outpatient Medications on File Prior to Encounter  Medication Sig Dispense Refill   Prenatal Vit-Fe Fumarate-FA  (PREPLUS) 27-1 MG TABS Take 1 tablet by mouth daily. 30 tablet 13   Allergies  Allergen Reactions   Latex Hives and Itching   Pork-Derived Products Other (See Comments)    Does not eat due to religious reasons.    I have reviewed patient's Past Medical Hx, Surgical Hx, Family Hx, Social Hx, medications and allergies.   ROS:  Review of Systems  Gastrointestinal:  Negative for abdominal pain.  Genitourinary:  Positive for vaginal bleeding. Negative for pelvic pain.  Musculoskeletal:  Negative for back pain.   Review of Systems  Other systems negative   Physical Exam  Physical Exam Patient Vitals for the past 24 hrs:  BP Temp Temp src Pulse Resp SpO2 Height Weight  06/27/23 2055 (!) 168/94 97.7 F (  36.5 C) Oral 96 17 100 % 5' 4 (1.626 m) (!) 175.6 kg   Constitutional: Well-developed, well-nourished female in no acute distress.  Cardiovascular: normal rate Respiratory: normal effort GI: Abd soft, non-tender. MS: Extremities nontender, no edema, normal ROM Neurologic: Alert and oriented x 4.  GU: Neg CVAT.  PELVIC EXAM:  Deferred in lieu of ultrasound   LAB RESULTS Results for orders placed or performed during the hospital encounter of 06/27/23 (from the past 24 hours)  CBC     Status: Abnormal   Collection Time: 06/27/23  9:00 PM  Result Value Ref Range   WBC 6.0 4.0 - 10.5 K/uL   RBC 3.67 (L) 3.87 - 5.11 MIL/uL   Hemoglobin 10.2 (L) 12.0 - 15.0 g/dL   HCT 67.7 (L) 63.9 - 53.9 %   MCV 87.7 80.0 - 100.0 fL   MCH 27.8 26.0 - 34.0 pg   MCHC 31.7 30.0 - 36.0 g/dL   RDW 84.3 (H) 88.4 - 84.4 %   Platelets 246 150 - 400 K/uL   nRBC 0.0 0.0 - 0.2 %  Comprehensive metabolic panel     Status: Abnormal   Collection Time: 06/27/23  9:00 PM  Result Value Ref Range   Sodium 140 135 - 145 mmol/L   Potassium 3.8 3.5 - 5.1 mmol/L   Chloride 108 98 - 111 mmol/L   CO2 23 22 - 32 mmol/L   Glucose, Bld 112 (H) 70 - 99 mg/dL   BUN 10 6 - 20 mg/dL   Creatinine, Ser 9.20 0.44 -  1.00 mg/dL   Calcium  8.7 (L) 8.9 - 10.3 mg/dL   Total Protein 7.3 6.5 - 8.1 g/dL   Albumin  3.5 3.5 - 5.0 g/dL   AST 17 15 - 41 U/L   ALT 13 0 - 44 U/L   Alkaline Phosphatase 54 38 - 126 U/L   Total Bilirubin 0.4 0.0 - 1.2 mg/dL   GFR, Estimated >39 >39 mL/min   Anion gap 9 5 - 15  hCG, quantitative, pregnancy     Status: Abnormal   Collection Time: 06/27/23  9:00 PM  Result Value Ref Range   hCG, Beta Chain, Quant, S 511 (H) <5 mIU/mL        Component Ref Range & Units (hover) 2 wk ago (06/13/23) 2 wk ago (06/11/23) 12 yr ago (08/27/10)  hCG, Beta Chain, Quant, S 79 High  42 High         IMAGING US  OB LESS THAN 14 WEEKS WITH OB TRANSVAGINAL Result Date: 06/27/2023 CLINICAL DATA:  Vaginal bleeding for 2 weeks. Beta HCG pending. LMP 04/08/2023 EXAM: OBSTETRIC <14 WK US  AND TRANSVAGINAL OB US  TECHNIQUE: Both transabdominal and transvaginal ultrasound examinations were performed for complete evaluation of the gestation as well as the maternal uterus, adnexal regions, and pelvic cul-de-sac. Transvaginal technique was performed to assess early pregnancy. COMPARISON:  None Available. FINDINGS: Intrauterine gestational sac: None. Thickened heterogenous endometrium. Exam limited due to body habitus. Yolk sac:  Not Visualized. Embryo:  Not Visualized. Cardiac Activity: Not Visualized. Subchorionic hemorrhage:  None visualized. Maternal uterus/adnexae: Unremarkable ovaries.  No free fluid. IMPRESSION: Thickened heterogenous endometrium. No visualized intrauterine gestational sac. In the setting of positive pregnancy test, this reflects a pregnancy of unknown location. Differential considerations include early normal IUP, abnormal IUP, or nonvisualized ectopic pregnancy. Differentiation is achieved with serial beta HCG supplemented by repeat sonography as clinically warranted. Electronically Signed   By: Norman Gatlin M.D.   On: 06/27/2023 22:33  MAU Management/MDM: I have reviewed the triage  vital signs and the nursing notes.   Pertinent labs & imaging results that were available during my care of the patient were reviewed by me and considered in my medical decision making (see chart for details).      I have reviewed her medical records including past results, notes and treatments. Medical, Surgical, and family history were reviewed.  Medications and recent lab tests were reviewed  Ordered repeat HCG.  It has gone up but not with expected doubling.   Will check followup Ultrasound to rule out ectopic. This did not show any gestational sac in uterus or pelvis Discussed findings with her and husband, that with her HCG levels not rising appropriately this is likely an abnormal pregnancy, but recommend one more HCG level to be sure of trajectory.  Consult Dr Izell with presentation, exam findings, and results.  He recommends repeat HCG on Friday.   This bleeding/pain can represent a normal pregnancy with bleeding, spontaneous abortion or even an ectopic which can be life-threatening.  The process as listed above helps to determine which of these is present.    ASSESSMENT Pregnancy at [redacted]w[redacted]d by LMP Moderate vaginal bleeding Pregnancy of unknown location  PLAN Discharge home Plan to repeat HCG level in 48 hours in clinic  Will repeat  Ultrasound in about 7-10 days if HCG levels double appropriately  Ectopic precautions  Offered for her to followup at [facility], wants to finish follow up  here  Pt stable at time of discharge. Encouraged to return here if she develops worsening of symptoms, increase in pain, fever, or other concerning symptoms.    Earnie Pouch CNM, MSN Certified Nurse-Midwife 06/27/2023  9:57 PM

## 2023-06-27 NOTE — MAU Note (Signed)
..  Krystal Hartman is a 33 y.o. at [redacted]w[redacted]d here in MAU reporting: vaginal bleeding is getting worse, changes pad 5-6 times a day, but it is never saturated.  Denies pain.  Reports that her ultrasound from today got rescheduled.  Has procardia  but did not take it today because it gives her a headache.  Pain score: 0/10 Vitals:   06/27/23 2055  BP: (!) 168/94  Pulse: 96  Resp: 17  Temp: 97.7 F (36.5 C)  SpO2: 100%     Lab orders placed from triage: UA

## 2023-06-28 ENCOUNTER — Other Ambulatory Visit: Payer: Medicaid Other

## 2023-06-28 ENCOUNTER — Ambulatory Visit (HOSPITAL_COMMUNITY): Admission: RE | Admit: 2023-06-28 | Payer: Medicaid Other | Source: Ambulatory Visit

## 2023-07-02 ENCOUNTER — Ambulatory Visit: Payer: Medicaid Other

## 2023-07-02 ENCOUNTER — Other Ambulatory Visit: Payer: Medicaid Other

## 2023-07-02 ENCOUNTER — Other Ambulatory Visit: Payer: Self-pay

## 2023-07-02 VITALS — BP 152/90 | HR 96 | Wt 389.2 lb

## 2023-07-02 DIAGNOSIS — I10 Essential (primary) hypertension: Secondary | ICD-10-CM

## 2023-07-02 DIAGNOSIS — O3680X0 Pregnancy with inconclusive fetal viability, not applicable or unspecified: Secondary | ICD-10-CM

## 2023-07-02 DIAGNOSIS — Z3A00 Weeks of gestation of pregnancy not specified: Secondary | ICD-10-CM

## 2023-07-02 LAB — BETA HCG QUANT (REF LAB): hCG Quant: 30 m[IU]/mL

## 2023-07-02 MED ORDER — LABETALOL HCL 100 MG PO TABS: 100.0000 mg | ORAL_TABLET | Freq: Two times a day (BID) | ORAL | 0 refills | Status: DC

## 2023-07-02 NOTE — Progress Notes (Signed)
 Beta HCG Follow-up Visit  Krystal Hartman presents to [facility] for follow-up beta HCG lab. She was seen in MAU for vaginal bleeding on 06/27/23. Patient reports bleeding today that has not increase in flow since being seen in the MAU. Discussed with patient that we are following beta HCG levels today. Valid contact number for patient confirmed and patient also states she has access to Mychart. I will call the patient with results. Patient states that she was supposed to be reached out to have a follow up beta HCG level on Friday but no appointment was made.   Beta HCG results: 06/27/23 511  07/02/23 Pending    Results and patient history reviewed with Dr. Eveline, who states he will follow up with results.   Additionally, patient's BP was elevated during this visit. Bp was 144/94 and repeat BP was 152/90. Patient has a history of chronic HTN. Patient was started on Procardia  30mg  daily and has not taken it today as she reports having migraines from the medication. Per physician, Dr. Zina advised patient is to stop BP medicine Procardia  and to start labetalol  100 mg BID. Patient voiced understanding. Patient will return to our office in 1 week for BP check.     Rosaline Pendleton 07/02/2023 3:29 PM

## 2023-07-03 ENCOUNTER — Telehealth: Payer: Self-pay

## 2023-07-03 NOTE — Telephone Encounter (Signed)
 Called patient regarding lab results and to schedule patient for recheck HCG. Patient has an appointment for recheck BP on 1/20 at 3:20 PM and we would schedule lab at that appointment.  Left VM to call our office back.  Marcelino Duster, RN

## 2023-07-03 NOTE — Telephone Encounter (Signed)
-----   Message from Warden Fillers sent at 07/03/2023  1:59 PM EST ----- Bhcg decreased to 30, recheck in 1 week

## 2023-07-04 NOTE — Telephone Encounter (Signed)
 Called patient regarding lab results.   Left pt VM to call our office.    Moira Andrews, RN

## 2023-07-04 NOTE — Telephone Encounter (Signed)
 Sent a patient a message via Mychart.   Moira Andrews, RN

## 2023-07-09 ENCOUNTER — Ambulatory Visit (INDEPENDENT_AMBULATORY_CARE_PROVIDER_SITE_OTHER): Payer: Medicaid Other | Admitting: *Deleted

## 2023-07-09 ENCOUNTER — Other Ambulatory Visit: Payer: Self-pay

## 2023-07-09 VITALS — BP 157/98 | HR 64 | Ht 64.0 in | Wt 386.0 lb

## 2023-07-09 DIAGNOSIS — Z3A13 13 weeks gestation of pregnancy: Secondary | ICD-10-CM

## 2023-07-09 DIAGNOSIS — O039 Complete or unspecified spontaneous abortion without complication: Secondary | ICD-10-CM

## 2023-07-09 NOTE — Progress Notes (Signed)
Here for BP check and non stat bhg. States she is not having headaches any longer since she changed to Labetalol. BP 155/90, repeat 157/98. Discussed assessment, history with [name]. Advised patient to increase Labetalol to 200mg   BID and follow up with her PCP asap as her BP med may be changed due to miscarriage. Krystal Hartman states she has enough Labetalol to last until she sees her PCP within 2 weeks and will notify us to send in RX if she will run out before seeing PCP. Advised she will see bhcg results and if not less than 5 will need another lab draw next week, but should be contacted with results. She voices understanding. [name]

## 2023-07-10 LAB — BETA HCG QUANT (REF LAB): hCG Quant: 2 m[IU]/mL

## 2023-08-08 ENCOUNTER — Other Ambulatory Visit: Payer: Self-pay | Admitting: Obstetrics and Gynecology

## 2023-08-08 DIAGNOSIS — I10 Essential (primary) hypertension: Secondary | ICD-10-CM

## 2023-08-08 DIAGNOSIS — O3680X0 Pregnancy with inconclusive fetal viability, not applicable or unspecified: Secondary | ICD-10-CM
